# Patient Record
Sex: Male | Born: 1945 | Race: White | Hispanic: No | Marital: Married | Smoking: Former smoker
Health system: Southern US, Community
[De-identification: ages and names within clinical notes are randomized; demographics above are authoritative.]

## PROBLEM LIST (undated history)

## (undated) DIAGNOSIS — G4733 Obstructive sleep apnea (adult) (pediatric): Secondary | ICD-10-CM

## (undated) DIAGNOSIS — I739 Peripheral vascular disease, unspecified: Secondary | ICD-10-CM

## (undated) DIAGNOSIS — F4310 Post-traumatic stress disorder, unspecified: Secondary | ICD-10-CM

## (undated) DIAGNOSIS — E669 Obesity, unspecified: Secondary | ICD-10-CM

## (undated) DIAGNOSIS — E785 Hyperlipidemia, unspecified: Secondary | ICD-10-CM

## (undated) DIAGNOSIS — F32A Depression, unspecified: Secondary | ICD-10-CM

## (undated) DIAGNOSIS — R0602 Shortness of breath: Secondary | ICD-10-CM

## (undated) DIAGNOSIS — I639 Cerebral infarction, unspecified: Secondary | ICD-10-CM

## (undated) DIAGNOSIS — I25810 Atherosclerosis of coronary artery bypass graft(s) without angina pectoris: Secondary | ICD-10-CM

## (undated) DIAGNOSIS — I252 Old myocardial infarction: Secondary | ICD-10-CM

## (undated) DIAGNOSIS — E119 Type 2 diabetes mellitus without complications: Secondary | ICD-10-CM

## (undated) HISTORY — DX: Shortness of breath: R06.02

## (undated) HISTORY — DX: Peripheral vascular disease, unspecified: I73.9

## (undated) HISTORY — DX: Hyperlipidemia, unspecified: E78.5

## (undated) HISTORY — DX: Old myocardial infarction: I25.2

## (undated) HISTORY — DX: Obesity, unspecified: E66.9

## (undated) HISTORY — DX: Obstructive sleep apnea (adult) (pediatric): G47.33

## (undated) HISTORY — DX: Atherosclerosis of coronary artery bypass graft(s) without angina pectoris: I25.810

## (undated) HISTORY — DX: Type 2 diabetes mellitus without complications: E11.9

---

## 1999-10-06 ENCOUNTER — Encounter: Admission: RE | Admit: 1999-10-06 | Discharge: 2000-01-04 | Payer: Self-pay | Admitting: Family Medicine

## 2000-01-08 ENCOUNTER — Emergency Department (HOSPITAL_COMMUNITY): Admission: EM | Admit: 2000-01-08 | Discharge: 2000-01-08 | Payer: Self-pay | Admitting: Emergency Medicine

## 2001-01-07 ENCOUNTER — Encounter: Payer: Self-pay | Admitting: Family Medicine

## 2001-01-07 ENCOUNTER — Ambulatory Visit (HOSPITAL_COMMUNITY): Admission: RE | Admit: 2001-01-07 | Discharge: 2001-01-07 | Payer: Self-pay | Admitting: Family Medicine

## 2001-01-16 ENCOUNTER — Ambulatory Visit (HOSPITAL_COMMUNITY): Admission: RE | Admit: 2001-01-16 | Discharge: 2001-01-16 | Payer: Self-pay | Admitting: Family Medicine

## 2001-01-16 ENCOUNTER — Encounter: Payer: Self-pay | Admitting: Family Medicine

## 2001-10-25 ENCOUNTER — Encounter: Payer: Self-pay | Admitting: Cardiovascular Disease

## 2001-10-25 ENCOUNTER — Ambulatory Visit (HOSPITAL_COMMUNITY): Admission: RE | Admit: 2001-10-25 | Discharge: 2001-10-25 | Payer: Self-pay | Admitting: Cardiovascular Disease

## 2003-12-13 ENCOUNTER — Ambulatory Visit (HOSPITAL_COMMUNITY): Admission: RE | Admit: 2003-12-13 | Discharge: 2003-12-13 | Payer: Self-pay | Admitting: Family Medicine

## 2003-12-22 DIAGNOSIS — I252 Old myocardial infarction: Secondary | ICD-10-CM

## 2003-12-22 HISTORY — DX: Old myocardial infarction: I25.2

## 2004-08-27 ENCOUNTER — Observation Stay (HOSPITAL_COMMUNITY): Admission: EM | Admit: 2004-08-27 | Discharge: 2004-08-28 | Payer: Self-pay | Admitting: Emergency Medicine

## 2004-08-28 ENCOUNTER — Encounter: Payer: Self-pay | Admitting: Neurology

## 2004-08-28 ENCOUNTER — Encounter (INDEPENDENT_AMBULATORY_CARE_PROVIDER_SITE_OTHER): Payer: Self-pay | Admitting: Cardiology

## 2004-09-02 ENCOUNTER — Encounter (HOSPITAL_BASED_OUTPATIENT_CLINIC_OR_DEPARTMENT_OTHER): Admission: RE | Admit: 2004-09-02 | Discharge: 2004-09-16 | Payer: Self-pay | Admitting: Internal Medicine

## 2004-10-09 ENCOUNTER — Inpatient Hospital Stay (HOSPITAL_COMMUNITY): Admission: EM | Admit: 2004-10-09 | Discharge: 2004-10-12 | Payer: Self-pay | Admitting: Emergency Medicine

## 2004-10-10 ENCOUNTER — Encounter: Payer: Self-pay | Admitting: Cardiology

## 2004-11-06 ENCOUNTER — Ambulatory Visit: Payer: Self-pay | Admitting: Cardiology

## 2004-11-10 ENCOUNTER — Encounter (HOSPITAL_COMMUNITY): Admission: RE | Admit: 2004-11-10 | Discharge: 2005-02-08 | Payer: Self-pay | Admitting: Cardiology

## 2005-02-03 ENCOUNTER — Ambulatory Visit: Payer: Self-pay | Admitting: Cardiology

## 2005-02-09 ENCOUNTER — Encounter (HOSPITAL_COMMUNITY): Admission: RE | Admit: 2005-02-09 | Discharge: 2005-05-10 | Payer: Self-pay | Admitting: Cardiology

## 2005-02-18 ENCOUNTER — Ambulatory Visit: Payer: Self-pay | Admitting: Pulmonary Disease

## 2005-03-27 ENCOUNTER — Encounter: Payer: Self-pay | Admitting: Pulmonary Disease

## 2005-03-27 ENCOUNTER — Ambulatory Visit (HOSPITAL_BASED_OUTPATIENT_CLINIC_OR_DEPARTMENT_OTHER): Admission: RE | Admit: 2005-03-27 | Discharge: 2005-03-27 | Payer: Self-pay | Admitting: Pulmonary Disease

## 2005-04-17 ENCOUNTER — Ambulatory Visit: Payer: Self-pay | Admitting: Pulmonary Disease

## 2005-05-04 ENCOUNTER — Ambulatory Visit: Payer: Self-pay | Admitting: Pulmonary Disease

## 2005-09-30 ENCOUNTER — Ambulatory Visit: Payer: Self-pay | Admitting: Cardiology

## 2005-11-16 ENCOUNTER — Ambulatory Visit: Payer: Self-pay | Admitting: Cardiology

## 2007-07-14 ENCOUNTER — Ambulatory Visit: Payer: Self-pay | Admitting: Pulmonary Disease

## 2007-08-17 ENCOUNTER — Ambulatory Visit: Payer: Self-pay | Admitting: Cardiology

## 2007-08-25 ENCOUNTER — Ambulatory Visit: Payer: Self-pay

## 2007-09-08 ENCOUNTER — Ambulatory Visit: Payer: Self-pay | Admitting: Cardiology

## 2007-09-08 LAB — CONVERTED CEMR LAB
BUN: 18 mg/dL (ref 6–23)
CO2: 31 meq/L (ref 19–32)
Calcium: 10.1 mg/dL (ref 8.4–10.5)
Chloride: 103 meq/L (ref 96–112)
Creatinine, Ser: 1.2 mg/dL (ref 0.4–1.5)
GFR calc Af Amer: 79 mL/min
GFR calc non Af Amer: 66 mL/min
Glucose, Bld: 100 mg/dL — ABNORMAL HIGH (ref 70–99)
Potassium: 4.6 meq/L (ref 3.5–5.1)
Sodium: 141 meq/L (ref 135–145)

## 2007-09-14 ENCOUNTER — Ambulatory Visit: Payer: Self-pay | Admitting: Cardiology

## 2007-09-14 LAB — CONVERTED CEMR LAB
BUN: 19 mg/dL (ref 6–23)
Basophils Absolute: 0 10*3/uL (ref 0.0–0.1)
Basophils Relative: 0.5 % (ref 0.0–1.0)
CO2: 27 meq/L (ref 19–32)
Calcium: 9.5 mg/dL (ref 8.4–10.5)
Chloride: 110 meq/L (ref 96–112)
Creatinine, Ser: 1.2 mg/dL (ref 0.4–1.5)
Eosinophils Absolute: 0.1 10*3/uL (ref 0.0–0.6)
Eosinophils Relative: 1.6 % (ref 0.0–5.0)
GFR calc Af Amer: 79 mL/min
GFR calc non Af Amer: 66 mL/min
Glucose, Bld: 79 mg/dL (ref 70–99)
HCT: 42.8 % (ref 39.0–52.0)
Hemoglobin: 14.5 g/dL (ref 13.0–17.0)
INR: 0.8 (ref 0.8–1.0)
Lymphocytes Relative: 20.6 % (ref 12.0–46.0)
MCHC: 33.9 g/dL (ref 30.0–36.0)
MCV: 91.4 fL (ref 78.0–100.0)
Monocytes Absolute: 0.7 10*3/uL (ref 0.2–0.7)
Monocytes Relative: 8.5 % (ref 3.0–11.0)
Neutro Abs: 5.6 10*3/uL (ref 1.4–7.7)
Neutrophils Relative %: 68.8 % (ref 43.0–77.0)
Platelets: 188 10*3/uL (ref 150–400)
Potassium: 6.8 meq/L (ref 3.5–5.1)
Prothrombin Time: 10.9 s (ref 10.9–13.3)
RBC: 4.68 M/uL (ref 4.22–5.81)
RDW: 13.6 % (ref 11.5–14.6)
Sodium: 142 meq/L (ref 135–145)
WBC: 8 10*3/uL (ref 4.5–10.5)
aPTT: 26.4 s (ref 21.7–29.8)

## 2007-09-15 ENCOUNTER — Inpatient Hospital Stay (HOSPITAL_COMMUNITY): Admission: EM | Admit: 2007-09-15 | Discharge: 2007-09-18 | Payer: Self-pay | Admitting: Emergency Medicine

## 2007-09-15 ENCOUNTER — Ambulatory Visit: Payer: Self-pay | Admitting: Internal Medicine

## 2007-09-16 ENCOUNTER — Encounter: Payer: Self-pay | Admitting: Thoracic Surgery (Cardiothoracic Vascular Surgery)

## 2007-09-16 ENCOUNTER — Ambulatory Visit: Payer: Self-pay | Admitting: Thoracic Surgery (Cardiothoracic Vascular Surgery)

## 2007-09-21 ENCOUNTER — Ambulatory Visit (HOSPITAL_COMMUNITY)
Admission: RE | Admit: 2007-09-21 | Discharge: 2007-09-21 | Payer: Self-pay | Admitting: Thoracic Surgery (Cardiothoracic Vascular Surgery)

## 2007-09-21 DIAGNOSIS — I639 Cerebral infarction, unspecified: Secondary | ICD-10-CM

## 2007-09-21 HISTORY — DX: Cerebral infarction, unspecified: I63.9

## 2007-09-22 ENCOUNTER — Inpatient Hospital Stay (HOSPITAL_COMMUNITY)
Admission: RE | Admit: 2007-09-22 | Discharge: 2007-09-27 | Payer: Self-pay | Admitting: Thoracic Surgery (Cardiothoracic Vascular Surgery)

## 2007-10-07 ENCOUNTER — Ambulatory Visit: Payer: Self-pay | Admitting: Thoracic Surgery (Cardiothoracic Vascular Surgery)

## 2007-10-17 ENCOUNTER — Ambulatory Visit: Payer: Self-pay | Admitting: Cardiology

## 2007-10-24 ENCOUNTER — Ambulatory Visit: Payer: Self-pay | Admitting: Thoracic Surgery (Cardiothoracic Vascular Surgery)

## 2007-10-24 ENCOUNTER — Encounter
Admission: RE | Admit: 2007-10-24 | Discharge: 2007-10-24 | Payer: Self-pay | Admitting: Thoracic Surgery (Cardiothoracic Vascular Surgery)

## 2007-11-23 ENCOUNTER — Ambulatory Visit: Payer: Self-pay | Admitting: Cardiology

## 2008-02-21 ENCOUNTER — Ambulatory Visit: Payer: Self-pay | Admitting: Cardiology

## 2008-05-25 ENCOUNTER — Ambulatory Visit: Payer: Self-pay | Admitting: Cardiology

## 2008-05-25 LAB — CONVERTED CEMR LAB
ALT: 41 units/L (ref 0–53)
AST: 45 units/L — ABNORMAL HIGH (ref 0–37)
Albumin: 3.9 g/dL (ref 3.5–5.2)
Alkaline Phosphatase: 51 units/L (ref 39–117)
BUN: 10 mg/dL (ref 6–23)
Bilirubin, Direct: 0.1 mg/dL (ref 0.0–0.3)
CO2: 28 meq/L (ref 19–32)
Calcium: 9.5 mg/dL (ref 8.4–10.5)
Chloride: 103 meq/L (ref 96–112)
Cholesterol: 131 mg/dL (ref 0–200)
Creatinine, Ser: 1.1 mg/dL (ref 0.4–1.5)
GFR calc Af Amer: 88 mL/min
GFR calc non Af Amer: 73 mL/min
Glucose, Bld: 101 mg/dL — ABNORMAL HIGH (ref 70–99)
HDL: 34.6 mg/dL — ABNORMAL LOW (ref >39.0)
LDL Cholesterol: 60 mg/dL (ref 0–99)
Potassium: 4.4 meq/L (ref 3.5–5.1)
Sodium: 141 meq/L (ref 135–145)
Total Bilirubin: 0.7 mg/dL (ref 0.3–1.2)
Total CHOL/HDL Ratio: 3.8
Total Protein: 7 g/dL (ref 6.0–8.3)
Triglycerides: 184 mg/dL — ABNORMAL HIGH (ref 0–149)
VLDL: 37 mg/dL (ref 0–40)

## 2008-07-23 ENCOUNTER — Ambulatory Visit: Payer: Self-pay | Admitting: Cardiology

## 2008-08-06 ENCOUNTER — Encounter: Payer: Self-pay | Admitting: Pulmonary Disease

## 2008-08-06 ENCOUNTER — Ambulatory Visit: Payer: Self-pay | Admitting: Cardiology

## 2008-08-06 DIAGNOSIS — E119 Type 2 diabetes mellitus without complications: Secondary | ICD-10-CM | POA: Insufficient documentation

## 2008-08-06 DIAGNOSIS — E785 Hyperlipidemia, unspecified: Secondary | ICD-10-CM | POA: Insufficient documentation

## 2008-08-06 DIAGNOSIS — E1151 Type 2 diabetes mellitus with diabetic peripheral angiopathy without gangrene: Secondary | ICD-10-CM | POA: Insufficient documentation

## 2008-08-06 DIAGNOSIS — E669 Obesity, unspecified: Secondary | ICD-10-CM | POA: Insufficient documentation

## 2008-08-06 DIAGNOSIS — G4733 Obstructive sleep apnea (adult) (pediatric): Secondary | ICD-10-CM | POA: Insufficient documentation

## 2008-08-06 LAB — CONVERTED CEMR LAB
BUN: 14 mg/dL (ref 6–23)
CO2: 32 meq/L (ref 19–32)
Calcium: 10 mg/dL (ref 8.4–10.5)
Chloride: 106 meq/L (ref 96–112)
Creatinine, Ser: 1.2 mg/dL (ref 0.4–1.5)
GFR calc Af Amer: 79 mL/min
GFR calc non Af Amer: 66 mL/min
Glucose, Bld: 155 mg/dL — ABNORMAL HIGH (ref 70–99)
Potassium: 5.3 meq/L — ABNORMAL HIGH (ref 3.5–5.1)
Sodium: 142 meq/L (ref 135–145)

## 2008-08-30 ENCOUNTER — Ambulatory Visit: Payer: Self-pay | Admitting: Cardiology

## 2008-08-30 LAB — CONVERTED CEMR LAB
BUN: 11 mg/dL (ref 6–23)
CO2: 28 meq/L (ref 19–32)
Calcium: 9.7 mg/dL (ref 8.4–10.5)
Chloride: 101 meq/L (ref 96–112)
Creatinine, Ser: 1.1 mg/dL (ref 0.4–1.5)
GFR calc Af Amer: 88 mL/min
GFR calc non Af Amer: 72 mL/min
Glucose, Bld: 202 mg/dL — ABNORMAL HIGH (ref 70–99)
Potassium: 4.9 meq/L (ref 3.5–5.1)
Sodium: 136 meq/L (ref 135–145)

## 2008-11-23 ENCOUNTER — Encounter: Payer: Self-pay | Admitting: Pulmonary Disease

## 2008-12-07 IMAGING — CR DG CHEST 1V PORT
1 series · 1 of 1 positions shown · non-contrast
Comparison: 09/16/07.

CLINICAL DATA: CAD, CABG.
PORTABLE CHEST - 1 VIEW ? 0088 HOURS:

[view not recorded]
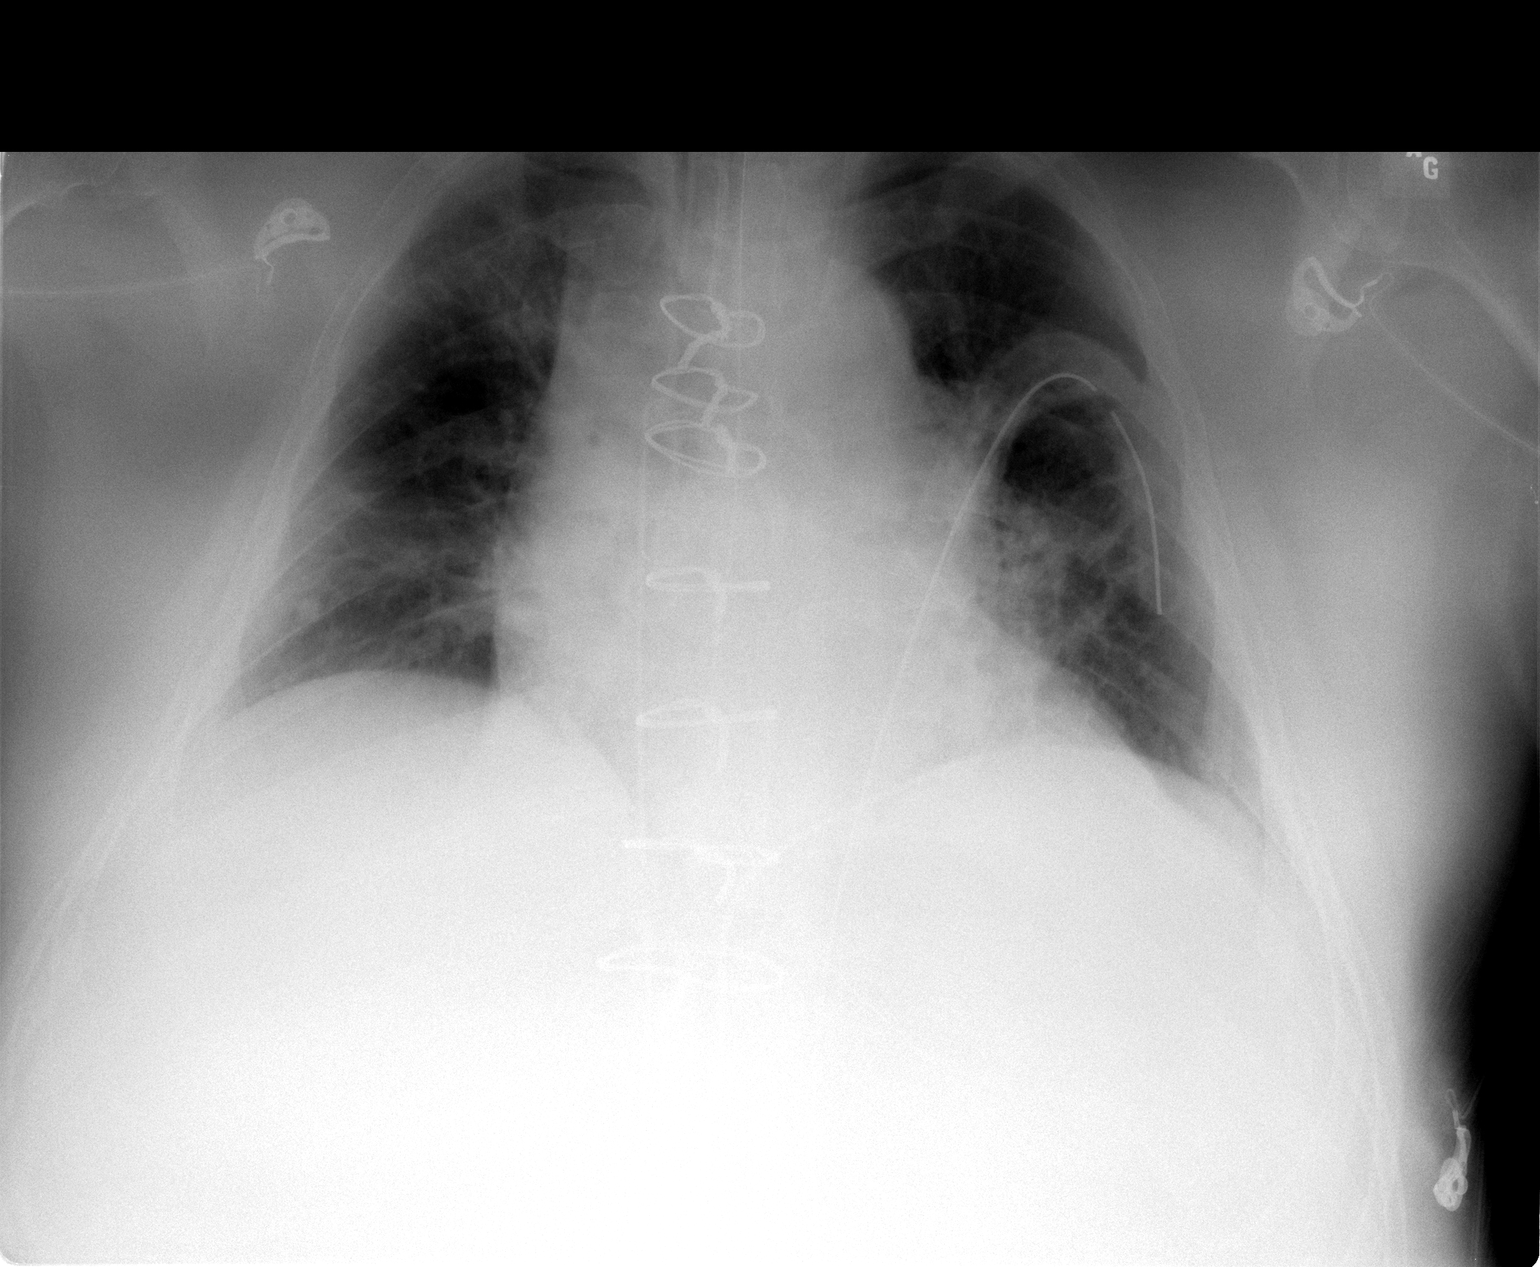

[1 of 1 positions shown; findings below may reference images not displayed]

FINDINGS: Status post CABG.  Multiple sternal wire sutures.  Mediastinal and left pleural chest tubes in situ.  Mild atelectatic changes in the lower lung zones.  No airspace opacities.  No pneumothorax.  Satisfactory ET tube position.  NG tube is noted traversing the esophagus and proximal stomach.  Cardiomediastinal silhouette appears unremarkable in caliber post CABG.
IMPRESSION: Atelectatic changes at the bases and minimally in the right upper lobe.  No pneumothorax.

## 2008-12-08 IMAGING — CR DG CHEST 1V PORT
1 series · 1 of 1 positions shown · non-contrast
Comparison: Yesterday.

CLINICAL DATA: Status post CABG.

PORTABLE CHEST - 1 VIEW

[view not recorded]
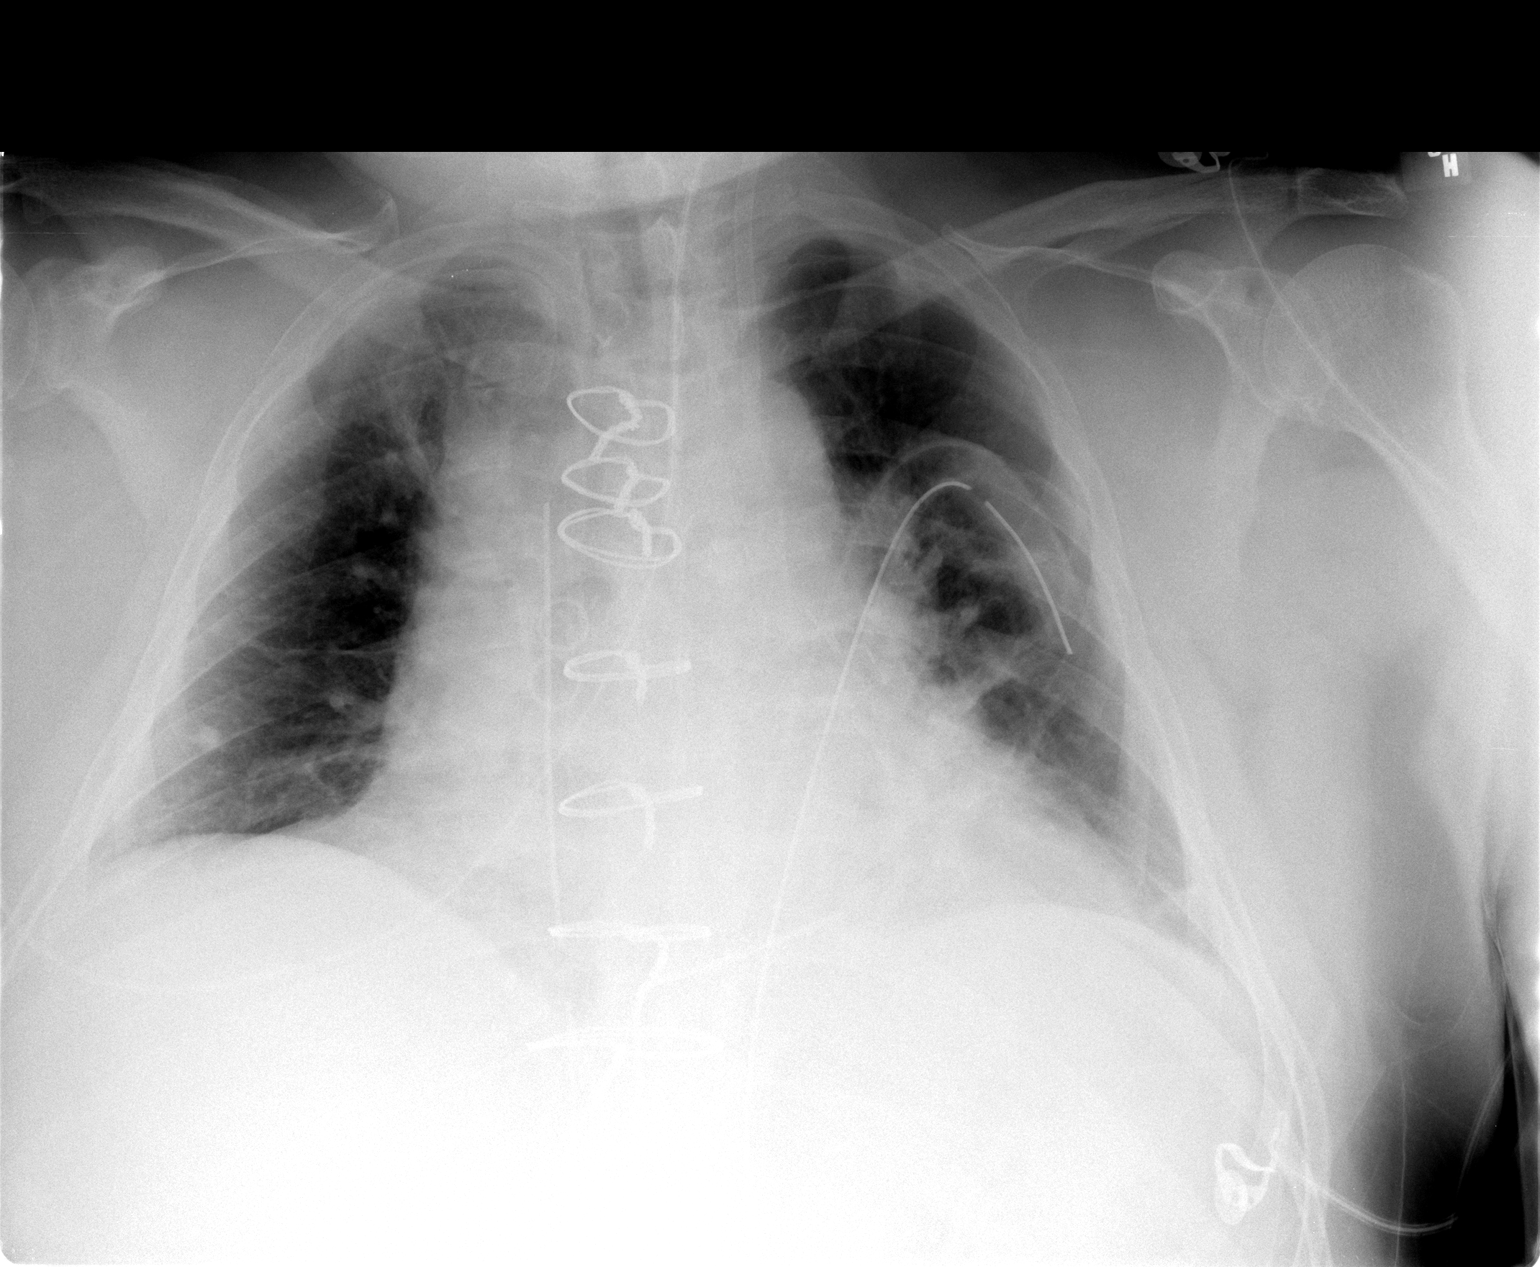

[1 of 1 positions shown; findings below may reference images not displayed]

FINDINGS: Stable post CABG changes with mediastinal and left chest tubes. No
pneumothorax seen. Stable right lung calcified granulomata and enlargement of
the cardiac silhouette. Mildly improved left lung airspace opacity and
significantly improved right basilar opacity.  

IMPRESSION

1. Improving bilateral atelectasis.
2. Stable cardiomegaly.

## 2009-12-10 ENCOUNTER — Ambulatory Visit: Payer: Self-pay | Admitting: Cardiology

## 2009-12-10 DIAGNOSIS — I25810 Atherosclerosis of coronary artery bypass graft(s) without angina pectoris: Secondary | ICD-10-CM | POA: Insufficient documentation

## 2009-12-10 DIAGNOSIS — R0602 Shortness of breath: Secondary | ICD-10-CM | POA: Insufficient documentation

## 2009-12-10 DIAGNOSIS — I252 Old myocardial infarction: Secondary | ICD-10-CM | POA: Insufficient documentation

## 2009-12-23 ENCOUNTER — Telehealth (INDEPENDENT_AMBULATORY_CARE_PROVIDER_SITE_OTHER): Payer: Self-pay

## 2009-12-24 ENCOUNTER — Telehealth (INDEPENDENT_AMBULATORY_CARE_PROVIDER_SITE_OTHER): Payer: Self-pay

## 2010-01-04 ENCOUNTER — Emergency Department (HOSPITAL_COMMUNITY): Admission: EM | Admit: 2010-01-04 | Discharge: 2010-01-04 | Payer: Self-pay | Admitting: Emergency Medicine

## 2011-01-20 NOTE — Assessment & Plan Note (Signed)
Summary: ROV  Medications Added METOPROLOL TARTRATE 50 MG  TABS (METOPROLOL TARTRATE) 1 two times a day [facility] CINNAMON 500 MG CAPS (CINNAMON) 2 TABS EVERY DAY      Allergies Added: ! LISINOPRIL  Visit Type:  rov  CC:  chest pain a few months back..some sob at times...numbness in hands at times but denies any edema.  Current Medications (verified): 1)  Glipizide 10 Mg  Tabs (Glipizide) .... Take 2 Tabs By Mouth Two Times A Day 2)  Metformin Hcl 850 Mg  Tabs (Metformin Hcl) .... Take 1 Tablet By Mouth Three Times A Day 3)  Crestor 20 Mg  Tabs (Rosuvastatin Calcium) .... Take 1 Tablet By Mouth Once A Day 4)  Bayer Aspirin 325 Mg  Tabs (Aspirin) .... Take 1 Tablet By Mouth Once A Day 5)  Fish Oil 1000 Mg  Caps (Omega-3 Fatty Acids) .... Take 1 Tablet By Mouth Three Times A Day 6)  Metoprolol Tartrate 50 Mg  Tabs (Metoprolol Tartrate) .Marland Kitchen.. 1 Two Times A Day 7)  Furosemide 20 Mg  Tabs (Furosemide) .... Take 1 Tablet By Mouth Once A Day 8)  Plavix 75 Mg  Tabs (Clopidogrel Bisulfate) .... Take 1 Tablet By Mouth Once A Day 9)  Nitroglycerin 0.4 Mg  Subl (Nitroglycerin) .... Use As Directed As Needed 10)  Cvs Cinnamon 500 Mg Caps (Cinnamon) .... 2 Tabs Every Day  Allergies (verified): 1)  ! 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 Take 1 tablet by mouth once a day    Metoprolol Tartrate 50 Mg Tabs (Metoprolol tartrate) .Marland Kitchen... 1 two times a day    Furosemide 20 Mg Tabs (Furosemide) .Marland Kitchen... Take 1 tablet by mouth once a day I am concerned about his dyspnea on exertion and known coronary disease and diabetes. This could be an ischemic equivalent. This is how we initially used diagnosed three-vessel disease leading to subsequent bypass surgery. We will schedule an objective assessment with a Myoview. Patient is in agreement. Orders: Nuclear Stress Test (Nuc Stress Test)  Orders: Nuclear Stress Test (Nuc Stress Test)  Problem # 2:  CAD, AUTOLOGOUS BYPASS GRAFT (ICD-414.02)  His updated medication list for this problem includes:    Bayer Aspirin 325 Mg Tabs (Aspirin) .Marland Kitchen... Take 1 tablet by mouth once a day    Metoprolol Tartrate 50 Mg Tabs (Metoprolol tartrate) .Marland Kitchen... 1 two times a day    Plavix 75 Mg Tabs (Clopidogrel bisulfate) .Marland Kitchen... Take 1 tablet by mouth once a day    Nitroglycerin 0.4 Mg Subl (Nitroglycerin) ..... Use as directed as needed  His updated medication list for this problem includes:    Bayer Aspirin 325 Mg Tabs (Aspirin) .Marland Kitchen... Take 1 tablet by mouth once a day    Metoprolol Tartrate 50 Mg Tabs (Metoprolol tartrate) .Marland Kitchen... 1 two times a day    Plavix 75 Mg Tabs (Clopidogrel bisulfate) .Marland Kitchen... Take 1 tablet by mouth once a day    Nitroglycerin 0.4 Mg Subl (Nitroglycerin) ..... Use as directed as needed  Problem # 3:  OBESITY (ICD-278.00) Assessment: Unchanged  Problem # 4:  HYPERLIPIDEMIA (ICD-272.4)  His updated medication list for this problem includes:    Crestor 20 Mg Tabs (Rosuvastatin calcium) .Marland Kitchen... Take 1 tablet by mouth once a day  Problem # 5:  DIABETES, TYPE 2 (ICD-250.00)  His updated medication list for this problem includes:    Glipizide 10 Mg Tabs (Glipizide) .Marland Kitchen... Take 2 tabs by mouth two times a  day    Metformin Hcl 850 Mg Tabs (Metformin hcl) .Marland Kitchen... Take 1 tablet by mouth three times a day    Bayer Aspirin 325 Mg Tabs (Aspirin) .Marland Kitchen... Take 1 tablet by mouth once a day  Problem # 6:  OLD MYOCARDIAL INFARCTION (ICD-412) Assessment: Unchanged  His updated medication list for this problem includes:    Bayer Aspirin 325 Mg Tabs (Aspirin) .Marland Kitchen... Take 1 tablet by mouth once a day    Metoprolol Tartrate 50 Mg Tabs (Metoprolol tartrate) .Marland Kitchen... 1 two times a day    Plavix 75 Mg Tabs (Clopidogrel bisulfate) .Marland Kitchen... Take 1 tablet by mouth once a day    Nitroglycerin 0.4 Mg Subl (Nitroglycerin) ..... Use as directed as needed  Other Orders: EKG w/ Interpretation (93000)  Patient Instructions: 1)  Your physician recommends that you schedule a follow-up appointment in: 12 MONTHS WITH DR Sherica Paternostro DUE 11/2010 2)  Your physician recommends that you continue on your current medications as directed. Please refer to the Current Medication list given to you today. 3)  Your physician has requested that you have an adenosine myoview.  For further information please visit [email or web address]  Please follow instruction sheet, as given.  Appended Document: [facility]      [facility] Provider[facility]   History of Present Illness: Mr. Weidler comes in today because of concerns about a slow heart rate. He checked his pulse other day he thought it was in the 30s. He denies any dizziness or syncope. He's had no chest pain or dyspnea on exertion or exercise intolerance.  He is due objective assessment of his coronary disease.  Allergies: 1)  ! Lisinopril  Appended Document: [facility]      Primary Provider:  [facility]   History of Present Illness: Previous append was incorrect for the patient.  Patient comes in today for followup of his coronary artery disease. He did have a significant dyspnea on exertion. This was his initial anginal equivalent.  He  denies any palpitations, presyncope, syncope, orthopnea, or PND. He is due objective assessment of his coronary disease.  Allergies: 1)  ! Lisinopril

## 2011-01-20 NOTE — Assessment & Plan Note (Signed)
Summary: rov for osa   Chief Complaint:  Pt is here for a follow up.  Pt states he has been wearing his cpap approx 75 % of the time.  Pt denied any new complaints.  .  History of Present Illness: the patient comes in today for followup of his obstructive sleep apnea. He has been wearing the CPAP fairly compliantly, and feels that he sleeps well with adequate daytime alertness. He is asking about a new CPAP machine, since his is aged. I have told him that we will check with insurance on this to see if he is eligible. He denies any issues with his mask fit, and is having no difficulty with pressure.     Current Allergies: No known allergies   Past Medical History:    Current Problems:     OBSTRUCTIVE SLEEP APNEA (ICD-327.23)    HYPERLIPIDEMIA (ICD-272.4)    DIABETES, TYPE 2 (ICD-250.00)    OBESITY (ICD-278.00)    CORONARY ARTERY DISEASE (ICD-414.00)         Risk Factors:  Tobacco use:  never    Year started:  since age 5     Vital Signs:  Patient Profile:   65 Years Old Male Weight:      253.50 pounds O2 Sat:      96 % O2 treatment:    Room Air Temp:     97.9 degrees F oral Pulse rate:   90 / minute BP sitting:   118 / 70  (right arm) Cuff size:   regular  Vitals Entered By: Cyndia Diver LPN (August 06, 2008 3:15 PM)             Is Patient Diabetic? Yes  Comments Medications reviewed with patient Cyndia Diver LPN  August 06, 2008 3:15 PM      Physical Exam  General:     overweight male in no acute distress Nose:     no skin breakdown or pressure necrosis from the CPAP mask      Impression & Recommendations:  Problem # 1:  OBSTRUCTIVE SLEEP APNEA (ICD-327.23) the patient is doing well from a sleep apnea standpoint. He is having no difficulties with tolerance, and is satisfied with his daytime alertness. I have encouraged him to work aggressively on weight loss however. I will also check with insurance to see if he qualifies for a new CPAP machine at  this point.  Medications Added to Medication List This Visit: 1)  Glipizide 10 Mg Tabs (Glipizide) .... Take 2 tabs by mouth two times a day 2)  Metformin Hcl 850 Mg Tabs (Metformin hcl) .... Take 1 tablet by mouth three times a day 3)  Crestor 20 Mg Tabs (Rosuvastatin calcium) .... Take 1 tablet by mouth once a day 4)  Bayer Aspirin 325 Mg Tabs (Aspirin) .... Take 1 tablet by mouth once a day 5)  Fish Oil 1000 Mg Caps (Omega-3 fatty acids) .... Take 1 tablet by mouth three times a day 6)  Metoprolol Tartrate 50 Mg Tabs (Metoprolol tartrate) .... Take 1 tablet by mouth once a day 7)  Furosemide 20 Mg Tabs (Furosemide) .... Take 1 tablet by mouth once a day 8)  Plavix 75 Mg Tabs (Clopidogrel bisulfate) .... Take 1 tablet by mouth once a day 9)  Nitroglycerin 0.4 Mg Subl (Nitroglycerin) .... Use as directed as needed   Patient Instructions: 1)  will get new supplies and mask for you 2)  will get new cpap if insurance allows 3)  work on weight loss 4)  f/u with me in one year, or sooner if problems   ]

## 2011-01-20 NOTE — Progress Notes (Signed)
Summary: sleep apnea/[facility]  sleep apnea/[facility]   Imported By: Lester [place] 08/07/2008 07:49:01  _____________________________________________________________________  External Attachment:    Type:   Image     Comment:   External Document

## 2011-01-20 NOTE — Progress Notes (Signed)
Summary: Cancellation of myoview  Phone Note Outgoing Call Call back at [facility] Phone [phone]   Call placed by: Irean Hong, RN,  December 24, 2009 10:11 AM Summary of Call: Myoview cancelled yesterday due to car trouble, left message on the patient's recorder to call us back to reschedule; notified Dr. Candie Mile nurse of no show. P. Charm Stenner,RN.

## 2011-01-20 NOTE — Progress Notes (Signed)
Summary: Nuc. Pre-Procedure  Phone Note Outgoing Call Call back at [facility] Phone [phone]   Call placed by: Irean Hong, RN,  December 23, 2009 11:12 AM Summary of Call: Left message with information on Myoview Information Sheet (see scanned document for details). Spoke with family member, and the patient is in [place] with car trouble. The patient wants to cancel the test, and will call us back to reschedule.P. Mizani Dilday,RN.     Nuclear Med Background Indications for Stress Test: Evaluation for Ischemia, Graft Patency, Stent Patency   History: Angioplasty, CABG, Echo, Heart Catheterization, Myocardial Infarction, Myocardial Perfusion Study, Stents  History Comments: '05 AWMI>PTCA/Stent LAD,DX. 9/08 MPS: Anteroseptal scar with mild peri-infarct ischemia> Cath: 2 vessel disease, patent LAD stent, EF=60%>CABG (x3). 10/08 Echo: EF=50%.  Symptoms: Chest Pain, DOE, SOB  Symptoms Comments: Occ. numbness of hands.   Nuclear Pre-Procedure Cardiac Risk Factors: Claudication, Family History - CAD, Lipids, NIDDM, Obesity, TIA Height (in): 68

## 2011-05-05 NOTE — Assessment & Plan Note (Signed)
OFFICE VISIT   [name], [name]  DOB:  01/17/1946                                        October 24, 2007  CHART #:  [id number]   SUBJECTIVE:  The patient is here for followup today.  He is status post  coronary artery bypass grafting on 09/22/2007.  He has done well  following his discharge home.  He has recently been seen by Dr. Daleen Squibb and  was taken off his Lisinopril and Plavix.  His blood sugars have been  well controlled and they have decreased his dose of Glipizide.  They did  increase his beta blocker dose and he has been tolerating this well.  His only complaint today is some edema in his hands.  He reports that  particularly on the right he is having problems with swelling in his  hand and difficulty making a fist.  He denies any loss of sensation or  numbness in the area.  He noted that this began shortly after his Lasix  was discontinued.  He is otherwise doing well.  He is riding a  stationary bike and walking daily.  His pain is well controlled.  His  appetite and his stamina are improving and he is otherwise doing well.   PHYSICAL EXAMINATION:  Blood pressure 113/72, pulse 88, respirations 18,  and O2 saturation 95% on room air.  His sternal incision is healing  well.  He does have an area on his left chest tube site, which has some  superficial skin edge necrosis, but there is no evidence of erythema or  drainage.  His leg incision also is healing well, but there is some  superficial separation with mild skin edge necrosis without erythema or  drainage.  Heart regular rate and rhythm without murmurs, rubs or  gallops.  Lungs are clear to auscultation.  Extremities show mild lower  extremity edema.  His chest x-ray shows no significant change.   ASSESSMENT AND PLAN:  The patient is doing well status post coronary  artery bypass graft on 09/22/2007.  He was seen by myself and Dr. Cornelius Moras  today.  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  Cornelius Moras has recommended conservative followup.  He was asked to followup on  an as needed basis and call if there are nay problems.   Salvatore Decent. Cornelius Moras, M.D.  Electronically Signed   GC/MEDQ  D:  10/24/2007  T:  10/25/2007  Job:  [id number]   cc:   Thomas C. Wall, MD, [facility]

## 2011-05-05 NOTE — Op Note (Signed)
NAME:  [name], [name] NO.:  [id number]   MEDICAL RECORD NO.:  [id number]          PATIENT TYPE:  INP   LOCATION:  2302                         FACILITY:  [facility]   PHYSICIAN:  Salvatore Decent. Cornelius Moras, M.D. DATE OF BIRTH:  10-21-1946   DATE OF PROCEDURE:  09/22/2007  DATE OF DISCHARGE:                               OPERATIVE REPORT   PREOPERATIVE DIAGNOSIS:  Severe two-vessel coronary artery disease.   POSTOPERATIVE DIAGNOSIS:  Severe two-vessel coronary artery disease.   PROCEDURE:  Median sternotomy for coronary artery bypass grafting x3  (left internal mammary artery to distal left anterior descending  coronary artery, saphenous vein graft to first diagonal branch,  saphenous vein graft to circumflex marginal branch,, endoscopic  saphenous vein harvest from right thigh).   SURGEON:  Dr. Purcell Nails.   ASSISTANT:  Dr. Charlett Lango.   SECOND ASSISTANT:  Mr. Lenise Herald.   ANESTHESIA:  General.   BRIEF CLINICAL NOTE:  The patient is a 65 year old obese white male with  known history of coronary artery disease, type 2 diabetes mellitus,  obesity, hyperlipidemia, and chronic obstructive sleep apnea.  The  patient's cardiac history dates back to October 2005 at which time he  suffered an acute anterior wall myocardial infarction.  He was treated  with cutting balloon angioplasty and stent placement of the left  anterior descending coronary artery and first diagonal branch.  Initially did he did well.  Recently he presents with symptoms of severe  exertional shortness of breath.  He underwent a stress Myoview exam that  was abnormal prompting cardiac catheterization that was performed  September 26.  This demonstrates mild to moderate left ventricular  dysfunction with severe two-vessel coronary artery disease including  restenosis of the left anterior descending coronary artery.  There is  coronary anatomy very unfavorable for repeat percutaneous  coronary  intervention.  Cardiac surgical consultation was requested.  The patient  and his wife have been counseled at length regarding the indications,  risks, and potential benefits of surgery.  Alternative treatment  strategies have been discussed.  They understand and accept all  associated risks of surgery and desire to proceed as described.   OPERATIVE FINDINGS:  1. Mild left ventricular dysfunction with ejection fraction estimated      50-55%.  2. Good-quality left internal mammary artery and saphenous vein      conduit for grafting.  3. Good-quality target vessels for grafting.   OPERATIVE NOTE IN DETAIL:  The patient is brought to the operating room  on the above-mentioned date and central monitoring was established by  the anesthesia service under the care and direction of Dr. Sharee Holster.  Specifically, attempts were made to place a Swan-Ganz  catheter.  These were completely unsuccessful and ultimately Swan-Ganz  catheter placement is aborted.  A radial arterial line is placed.  Intravenous antibiotics were administered.  Following induction with  general endotracheal anesthesia, a Foley catheter is placed.  The  patient's chest, abdomen, both groins, and both lower extremities are  prepared, draped in sterile manner.  Baseline transesophageal  echocardiogram was performed by Dr.  Massagee.  This demonstrates  essentially normal left ventricular function with mild anterior wall  hypokinesis.  No other abnormalities that are identified.   A median sternotomy incision is performed and left internal mammary  artery is dissected from the chest wall and prepared for bypass  grafting.  The left internal mammary artery is good-quality conduit.  Simultaneously saphenous vein was obtained the patient's right thigh  using endoscopic vein harvest technique.  The saphenous vein is good-  quality conduit.  Following removal of the saphenous vein, the small  incision in the right  thigh is closed in multiple layers with running  absorbable suture.  The patient is heparinized systemically.  The left  internal mammary artery is transected distally and noted to have  excellent flow.   The pericardium was opened.  The ascending aorta is normal in  appearance.  The ascending aorta and right atrium were cannulated for  cardiopulmonary bypass.  Adequate heparinization is verified.  Cardiopulmonary bypass is begun and the surface of heart was inspected.  Distal sites are selected for coronary bypass grafting.  A temperature  probe is placed in left ventricular septum.  A cardioplegia catheter is  placed in the ascending aorta.   The patient is allowed to cool passively to 32 degrees systemic  temperature.  The aortic crossclamp was applied and cold blood  cardioplegia is administered in an antegrade fashion through the aortic  root.  Iced saline slush is applied for topical hypothermia.  The  initial cardioplegic arrest and myocardial cooling is felt to be  excellent.  Repeat doses of cardioplegia are administered intermittently  throughout the crossclamp portion of the operation through the aortic  root and down the subsequently placed vein grafts to maintain left  ventricular septal temperature below 15 degrees centigrade.   The following distal coronary anastomoses were performed:  1. The first circumflex marginal branch is grafted with a saphenous      vein graft in end-to-side fashion.  This vessel was      intramyocardial.  It measures 1.8 mm in diameter and is a fair to      good quality target vessel for grafting.  2. The first diagonal branch of the left anterior descending coronary      artery is grafted with a saphenous vein graft in end-to-side      fashion.  This vessel measures 1.5 mm in diameter and is a fair to      good quality target vessel for grafting.  3. The distal left anterior descending coronary artery is grafted with      left internal  mammary artery in end-to-side fashion.  This vessel      measured 1.8 mm in diameter and is a good-quality target vessel for      grafting.   Both proximal saphenous vein anastomoses were performed directly to the  ascending aorta prior to removal of the aortic crossclamp.  Left  ventricular septal temperature rises rapidly with reperfusion of the  left internal mammary artery.  The aortic crossclamp was removed after a  total crossclamp time of 61 minutes.  The heart began to beat  spontaneously without need for cardioversion.  All proximal and distal  coronary anastomoses are inspected for hemostasis and appropriate graft  orientation.  Epicardial pacing wires were fixed to left ventricular  outflow tract and to the right atrial appendage.  The patient is  rewarmed to 37 degrees centigrade temperature.  The patient is weaned  from cardiopulmonary  bypass without difficulty.  The patient's rhythm at  separation from bypass is normal sinus rhythm.  No inotropic support is  required.  Total cardiopulmonary bypass time the operation is 74  minutes.   The venous and arterial cannulae are removed uneventfully.  Protamine is  administered to reverse anticoagulation.  The mediastinum and the left  chest are irrigated with saline solution containing vancomycin.  Meticulous surgical hemostasis ascertained.  The mediastinum and left  chest are drained with three chest tubes exited through separate stab  incisions inferiorly.  The soft tissues and pericardium anterior to the  aorta are reapproximated loosely.  The sternum is closed with double-  strength sternal wire.  The soft tissues anterior to the sternum were  closed in multiple layers and the skin is closed with a running  subcuticular skin closure.   The patient tolerated the procedure well and was transported the  surgical intensive care unit in stable condition.  There are no  intraoperative complications.  All sponge, instrument and  needle counts  were verified correct at completion of the operation.  No blood products  were administered.      Salvatore Decent. Cornelius Moras, M.D.  Electronically Signed     CHO/MEDQ  D:  09/22/2007  T:  09/23/2007  Job:  [id number]   cc:   Thomas C. Daleen Squibb, MD, [facility]  Bevelyn Buckles. Bensimhon, MD  Holley Bouche, M.D.

## 2011-05-05 NOTE — Assessment & Plan Note (Signed)
[facility]                            CARDIOLOGY OFFICE NOTE   NAME:[name], [name]                    MRN:          [id number]  DATE:09/08/2007                            DOB:          12/03/1946    OUTPATIENT CARDIAC CATHETERIZATION HISTORY AND PHYSICAL   CHIEF COMPLAINT:  Shortness of breath.   HISTORY OF PRESENT ILLNESS:  [name] is a 65 year old gentleman who  has known coronary artery disease.  He saw me on August 17, 2007, and  complained of profound dyspnea on exertion while climbing [place].   1. He had a previous anterior wall infarct in the past and has had a      cutting balloon and PTCA of the first diagonal and then a stent in      the first diagonal.  He also had a Cypher stent in the proximal      LAD.  His EF improved from 35% to 50% on followup.  This was in      October 2005.  2. Type 2 diabetes.  This has been under poor control.  He is followed      at the [place].  His last hemoglobin A1c was 7.5%.  3. Morbid obesity.  4. Hyperlipidemia, followed at the [place].  5. Severe obstructive sleep apnea.   His past medical history in addition to the above, his current  medications are:  1. Glipizide 20 mg p.o. b.i.d.  2. Lisinopril 40 mg a day.  3. Metformin 850 mg p.o. t.i.d.  4. Metoprolol 50 mg p.o. b.i.d.  5. Plavix 75 mg per day.  6. Rosuvastatin 20 mg p.o. q.h.s.  7. Aspirin 325 mg a day.  8. Fish oil 1000 mg p.o. t.i.d.  9. He carries nitroglycerin.   FAMILY HISTORY:  Noncontributory.   SOCIAL HISTORY:  Please see previous chart.   REVIEW OF SYSTEMS:  He denies any presyncope or syncope.  His blood  pressure usually runs around 90/60.  In addition to the HPI there is no  significant or remarkable review of systems.   EXAMINATION:  His blood pressure is 92/60, his pulse and regular, his  weight is 264 and stable.  HEENT:  Normocephalic, atraumatic.  Extraocular movements are intact.  Sclerae are clear.   Conjunctivae are pale.  PERRLA.  He is missing some  teeth.  No obvious cavities.  NECK:  Supple.  Carotid upstrokes are equal bilaterally without bruits.  No JVD.  Thyroid is not enlarged.  Trachea is midline.  LUNGS:  Clear to auscultation.  HEART:  Reveals a poorly-appreciated PMI.  He has a soft S1, S2.  ABDOMEN:  Protuberant with good bowel sounds.  He has cutaneous  discoloration over the entire stomach area and abdominal area.  This was  present on his first exam.  EXTREMITIES:  Reveal 2+ pitting edema.  He has chronic brawny changes at  the ankles.  He has few varicosities.  There is no obvious DVT.  His  dorsalis pedis is 2+/4+, posterior tibial was trace.  He has some  dependent rubor.  SKIN:  He has  a lot of red purplish discoloration over his abdomen as  noted.  He has been biopsied and said this did not show any cutaneous  lymphoma per his record.  NEUROLOGIC:  Intact.   His stress Myoview on August 25, 2007, showed EF of 45% with  anteroapical hypokinesia, scar with periinfarct ischemia.   ASSESSMENT AND PLAN:  [name] is high-risk for having progressive  coronary artery disease.  I am concerned about his extreme dyspnea on  exertion and now positive Myoview.   I have explained to him the need for coronary angiography.  Indications,  risks, potential benefits have been discussed.  He would like to do it  week after next when he has a work break.  He will need a baseline  creatinine today to see if he is safe to proceed with this.     Thomas C. Daleen Squibb, MD, [facility]  Electronically Signed    TCW/MedQ  DD: 09/08/2007  DT: 09/08/2007  Job #: [id number]

## 2011-05-05 NOTE — Discharge Summary (Signed)
NAME:  [name], [name] NO.:  [id number]   MEDICAL RECORD NO.:  [id number]          PATIENT TYPE:  INP   LOCATION:  2041                         FACILITY:  [facility]   PHYSICIAN:  Barnetta Chapel, MDDATE OF BIRTH:  [birth year]   DATE OF ADMISSION:  09/14/2007  DATE OF DISCHARGE:  09/18/2007                               DISCHARGE SUMMARY   PRIMARY CARE PHYSICIAN:  Holley Bouche, M.D.   DISCHARGE DIAGNOSES:  1. Severe diffuse two-vessel disease (coronary artery disease).  2. Hyperkalemia.  3. Diabetes mellitus.  4. Hypertension.  5. Hyperlipidemia.  6. Obesity.   DISCHARGE MEDICATIONS:  1. Metformin 850 mg three times daily.  2. Glipizide 20 mg twice daily.  3. Metoprolol 50 mg p.o. twice daily.  4. Crestor 20 mg p.o. daily.  5. Aspirin 325 mg p.o. daily.   PROCEDURE:  cardiac catheterization.  The cardiac catheterization showed  diffuse LAD disease with an ostial lesion.   CONSULTATIONS:  Cardiac/thoracic surgery consultation:  The patient was  seen by Dr. Purcell Nails.  The plan is for the patient to be  admitted for surgery on September 22, 2007.  The patient is going to have a  coronary artery bypass graft surgery.   HISTORY OF PRESENT ILLNESS:  Refer to the H&P done on September 15, 2007.  The patient is a 65 year old male with a significant cardiac  history.  He has a history of hypertension, hyperlipidemia, diabetes and  obesity.  The patient was scheduled to have a cardiac catheterization  prior to admission.  The patient was seen at the cardiologist's office  for a pre-catheterization workup.  During the visit, the patient was  found to have elevated potassium.  On admission the potassium was 6.8.  The patient was admitted for workup and management of hyperkalemia and  possible cause for the hyperkalemia.  (The patient was on Lisinopril 40  mg p.o. daily prior to admission.)   HOSPITAL COURSE:  The patient was admitted to the regular medical  floor.  He was managed with Kayexalate.  The patient's potassium is back to  normal.  Lisinopril is on hold and the patient has been advised to avoid  ACE inhibitors.  The patient eventually had a cardiac catheterization  that revealed severe diffuse two-vessel disease (the LAD and ostial).  A  cardiothoracic surgery consultation was called and the patient was seen  by Dr. Cornelius Moras.  The plan is for the patient to be readmitted in four days  for admission for a coronary artery bypass graft surgery.   DISCHARGE PLAN:  1. To discharge the patient today.  2. Follow up with the primary care Lenea Bywater in two to four weeks.  3. Avoid ACE inhibitors for now.  4. Hold Plavix.  5. Follow up with the cardiologist.  6. Follow up with the cardiothoracic surgeon.  7. An ADA diet, 1800 kilocalories.  8. Activity as tolerated.  9. There is also a plan for the patient to have a pulmonary function      test in the morning (on September 19, 2007).      Jacqlyn Krauss  Rockwell Alexandria, MD  Electronically Signed     SIO/MEDQ  D:  09/18/2007  T:  09/18/2007  Job:  [id number]   cc:   Holley Bouche, M.D.  Thomas C. Daleen Squibb, MD, [facility]  Clarence H. Cornelius Moras, M.D.

## 2011-05-05 NOTE — Assessment & Plan Note (Signed)
[facility]                            CARDIOLOGY OFFICE NOTE   NAME:Artiga, LONNELL CHAPUT                    MRN:          [id number]  DATE:08/17/2007                            DOB:          03-31-1946    [name] returns today for followup.  It has been 2 years since he  has been seen in the office.  He is a former patient of Dr. Kayren Eaves, who has moved to [place].   PROBLEM LIST:  1. Status post acute anterior wall myocardial infarction.  He is      status post stenting of the left anterior descending.  His post-      interventional echocardiographic ejection fraction improved from      35% to about 45% to 50%.  He specifically had a cutting balloon and      percutaneous transluminal coronary angioplasty of the first      diagonal and then had a stent deployed in the first diagonal.  He      then also had a Cypher stent placed in the proximal left anterior      descending.  He is having profound dyspnea on exertion.  He went to      [facility] and said he was breathing extremely hard.  2. Type 2 diabetes.  This is followed at the [facility].      His last hemoglobin A1c was 7.5%.  3. Morbid obesity.  4. Hyperlipidemia, followed at the [facility].  5. Severe obstructive sleep apnea.   MEDICATIONS:  1. Glipizide 20 mg b.i.d.  2. Lisinopril 40 mg a day.  3. Metformin 850 mg p.o. t.i.d.  4. Metoprolol 50 mg b.i.d.  5. Plavix 75 mg a day.  6. Rosuvastatin 20 mg nightly.  7. Aspirin 325 mg a day.  8. Fish oil 1000 mg t.i.d.  9. He carries nitroglycerin.  He asked me what they did today and I      explained it.   We also reviewed calling 911 and how to recognize an acute coronary  syndrome if it occurs again.   He denies any presyncope or syncope.  His blood pressure usually runs  around 90/60.   PHYSICAL EXAMINATION:  His blood pressure is 86/68, his pulse 83 and  regular.  His EKG is normal except  for low voltage.  He has somewhat  delayed R-wave progression across the anterior precordium.  He weighs  265 pounds.  HEENT:  Normocephalic, atraumatic.  PERRLA.  Extraocular movements  intact.  Sclerae are clear.  Conjunctivae are pale.  PERRLA.  He is  missing some teeth, but no obvious cavities.  NECK:  Supple.  Carotid upstrokes are equal bilaterally without bruits.  No JVD.  Thyroid is not enlarged.  Trachea is midline.  LUNGS:  Clear to auscultation bilaterally.  HEART:  Reveals a poorly appreciated PMI.  He has a soft S1 and S2.  ABDOMEN:  Protuberant with good bowel sounds and no midline bruit.  No  hepatomegaly.  EXTREMITIES:  Edema 1+.  He has chronic brawny changes at his ankles.  He has a few venous varicosities.  There is no obvious DVT.  His  dorsalis pedis is 2+/4+.  Posterior tibial is trace.  He has some  dependent rubor.  SKIN:  He has a lot of red, purplish discoloration over his abdomen.  He  has been biopsied and did not show any cutaneous lymphoma per his  record.  NEUROLOGIC:  Exam is intact.   ASSESSMENT:  1. Known coronary artery disease, status post anterior wall infarct,      good recovery of left ventricular function.  He is having profound      dyspnea on exertion which could be multifactorial.  We need to rule      out obstructive coronary disease and he is way overdue an objective      assessment of his coronary artery disease.  2. Other risk factors as mentioned above.   I spent 20 minutes plus talking to Mr. Breon about risk factor  modification, its impact on future cardiovascular events as well as  stroke, which he says he has also had by the way, and also the need for  him to have an exercise rest/stress Myoview.  He has also been told of  the importance of nitroglycerin and how to call 911.   I will plan on seeing him back in a year if his stress Myoview shows no  ischemia.  If he has ischemia, he will need a heart catheterization,   assuming his creatinine is normal.     Maisie Fus C. Daleen Squibb, MD, [facility]  Electronically Signed    TCW/MedQ  DD: 08/17/2007  DT: 08/18/2007  Job #: [id number]

## 2011-05-05 NOTE — H&P (Signed)
NAME:  [name], [name] NO.:  [id number]   MEDICAL RECORD NO.:  [id number]          PATIENT TYPE:  EMS   LOCATION:  MAJO                         FACILITY:  [facility]   PHYSICIAN:  Mick Sell, MD DATE OF BIRTH:  [birth year]   DATE OF ADMISSION:  09/15/2007  DATE OF DISCHARGE:                              HISTORY & PHYSICAL   PRIMARY PHYSICIAN:  Dr. Tiburcio Pea.   CHIEF COMPLAINT:  Told to come to the ER to evaluate a potassium level.   HISTORY OF PRESENT ILLNESS:  [name] is a very pleasant 65-  year-old white male with a history of diabetes, hyperlipidemia, CAD with  myocardial infarction 2005, requiring PTCA and 2 stent placements, who  was being seen in his primary doctor's office today for routine  preprocedure blood work for cardiac catheterization he is undergoing in  2 days.  His blood work came back and revealed an elevated potassium  level of 6.8.  Of note it had been checked on September 08, 2007, and  level was normal at 4.6.  Given the elevated potassium of 6.8, the  patient was called and asked to come to the emergency room to be  evaluated. Repeat potassium was 6.3 and he was given Kayexalate but the  potassium only came down to 6.0.   Of note, the patient is currently without any chest pain or  palpitations.  He does report that he has had some shortness of breath  that is near his baseline.  He does say he has been increasing his  physical activity over the last week, walking more on a treadmill.  He  does have some muscle tenderness but mainly only in his hips.  He also  has made some changes in his diet where he has cut out meat, but does  not note any other changes in his consumption of fruits or vegetables or  juices.  He is using a salt substitute called No Salt but is not sure  what that contains and has not changed the amount he has been using.  He  has also had no changes in his medication regimen.  He denies any other  symptoms  including fevers, chills, weight loss, night sweats, nausea,  vomiting, abdominal pain or diarrhea.   PAST MEDICAL HISTORY:  1. Myocardial infarction October 2005, requiring PTCA and stent      placement times 2.  2. Hypertension.  3. Hyperlipidemia.  4. Diabetes type 2.  5. Obesity.  6. Skin cancer with basal cell cancer.  7. History of appendectomy.  8. History of hernia repair.  9. Abnormal stress test over the last several weeks.  The patient is      being scheduled for a cardiac catheterization in 2 days.   SOCIAL HISTORY:  The patient works as an Airline pilot.  He does not smoke  or drink and lives with his wife.   FAMILY HISTORY:  His mother had diabetes and a stroke and his sister has  cancer.   ALLERGIES:  NO KNOWN DRUG ALLERGIES.   MEDICATIONS:  1. Aspirin 325 mg once a day.Marland Kitchen  2. Crestor 20 mg once a day.  3. Glipizide 20 mg b.i.d.  4. Lisinopril 40 mg once a day.  5. Metformin 850 t.i.d., 2 in the morning and 1 in the evening.  6. Metoprolol 50 mg b.i.d.  7. Plavix 75 mg once a day.  8. Omega 3 fatty acid pills.   ALLERGIES:  NO KNOWN DRUG ALLERGIES.   REVIEW OF SYSTEMS:  Eleven systems reviewed and negative except as per  HPI.   PHYSICAL EXAMINATION:  GENERAL:  The patient is a very pleasant obese  white male in no acute distress.  VITAL SIGNS:  Temperature 97.6, pulse 101, blood pressure 110/65,  respirations 24, saturating 97% on room air.  HEENT:  Pupils equal, round and reactive to light and accommodation.  Extraocular movements are intact.  Sclerae anicteric.  Oropharynx clear.  NECK:  Supple.  HEART:  Regular with no murmurs, rubs or gallops.  LUNGS:  Clear to auscultation bilaterally.  Abdomen:  Soft, nontender, nondistended. No hepatosplenomegaly.  EXTREMITIES:  He has trace bilateral pedal edema.  NEUROLOGIC:  He is alert and oriented times 3 with a grossly nonfocal  neuro exam.   DATA:  The patient has blood work which reveals a white count  8.0,  hemoglobin 14.5, platelets 188, PT 10.9, INR 0.8, PTT 26.4.  His blood  work this morning revealed a sodium 142, potassium 6.8, chloride 110,  CO2 27, BUN 19, creatinine 1.2 which appears to be his baseline.  Glucose is 79, calcium 9.5.  Repeat done in the ER with stat labs  revealed a potassium of 6.3.  He was given Kayexalate and several hours  following that his sodium was 135, potassium 6.0, chloride 104, CO2 22,  BUN 20, creatinine 1.2, glucose 202, calcium 9.4.  EKG done today  compared to 1 done October 11, 2004, reveals normal sinus rhythm with  somewhat low voltage QRS.  There is poor R-wave progression in the  precordial leads but this is old compared to October 2005.  No other ST  or T-wave changes noted.  Notably there is no QRS widening or peaked T  waves.   ASSESSMENT:  This is a pleasant 65 year old man with a significant  cardiac history including a recent abnormal stress test and a history of  diabetes, hypertension, and obesity who on routine lab check today for  preprocedure for a cath had a markedly elevated potassium level to 6.8.  This was repeated and remains elevated.  He is asymptomatic, does not  have any EKG changes, however, it has failed to come down following  conservative attempt at Kayexalate.   The differential for his hyperkalemia is unclear.  He is on lisinopril  which can definitely cause hyperkalemia, although I might expect to see  a bump in his creatinine as well.  Other thoughts would be if he were  mildly dehydrated with his increasing exercise over the last week or if  he had any muscle breakdown he could have an increase in his potassium.  He also uses some salt substitute, although I have been unable to  discover if that contains potassium but he has no other real changes in  his diet.   PLAN:  1. We will admit to telemetry for observation.  2. We will give him more doses of Kayexalate until he has a bowel      movement and repeat  his potassium level in 4  or 5 hours.  3. We will give him some gentleIV  fluids.  4. We will hold his lisinopril dosing.  If his potassium level does      not improve to normal, we will consider renal consult.  5. Diabetes.  We will hold his metformin as he will be undergoing a      cardiac catheterization.  We will place him on glipizide 20 mg      twice a day as outpatient dose and cover him with sliding-scale      insulin.  6. Hypertension.  Place him on his metoprolol but hold his lisinopril      as above.  7. Coronary artery disease.  Continue him on his aspirin and Plavix.      He is also on Crestor and will be scheduled for catheterization as      an outpatient on Friday.      Mick Sell, MD  Electronically Signed     DPF/MEDQ  D:  09/15/2007  T:  09/15/2007  Job:  [id number]

## 2011-05-05 NOTE — Consult Note (Signed)
NAME:  [name], [name] NO.:  [id number]   MEDICAL RECORD NO.:  [id number]          PATIENT TYPE:  INP   LOCATION:  2041                         FACILITY:  [facility]   PHYSICIAN:  Salvatore Decent. Cornelius Moras, M.D. DATE OF BIRTH:  [birth year]   DATE OF CONSULTATION:  09/16/2007  DATE OF DISCHARGE:                                 CONSULTATION   DATE OF PLANNED HOSPITAL ADMISSION FOR SURGERY:  September 22, 2007.   REASON FOR CONSULTATION:  Severe two-vessel coronary artery disease.   HISTORY OF PRESENT ILLNESS:  Mr. Mollica is a 65 year old obese white  male with known history of coronary artery disease, type 2 diabetes  mellitus, obesity, hyperlipidemia, and obstructive sleep apnea.  The  patient's cardiac history dates back to October 2005, at which time he  suffered an acute anterior wall myocardial infarction.  He was treated  with cutting balloon angioplasty of the left anterior descending  coronary artery and first diagonal branch with Cypher stent placement.  He initially did well.  Recently he has been followed by Dr. Juanito Doom,  and in late August he reported severe exertional shortness of breath  that developed while he was on a family hike climbing [place].  In  retrospect, the patient admits to progressive exertional shortness of  breath.  He has not had any chest pain or chest tightness.  He underwent  a stress Myoview exam on September 4 demonstrating resting ejection  fraction of 45% with anterior apical hypokinesia, scar and peri-infarct  ischemia.  He was subsequently scheduled for elective cardiac  catheterization.  However, on pre-catheterization lab, the patient was  noted to have a high potassium level, and subsequently he was admitted  to the hospital yesterday for further evaluation and treatment of his  hyperkalemia.  This resolved with Kayexalate administration, and the  patient underwent elective cardiac catheterization today by Dr. Nicholes Mango.  He  was found to have severe two-vessel coronary artery  disease including high-grade restenosis of the ostial portion of the  left anterior descending coronary artery, as well as high-grade stenosis  of a large first circumflex marginal branch.  There is mild left  ventricular dysfunction with anterior apical hypokinesis.  Cardiac  surgical consultation was requested.   REVIEW OF SYSTEMS:  GENERAL:  The patient reports normal appetite.  Over  the last 2 weeks he has been trying to stick to a very strict diet  prescribed by Dr. Daleen Squibb and he has actively been able to lose 6 or 8  pounds during this short period of time.  He does admit to gradual  progression of exertional fatigue over the last couple of years.  CARDIAC:  Notable for exertional shortness of breath.  The patient  denies any exertional chest pain or chest tightness either with activity  or at rest.  He did not develop chest pain during his stress test.  The  patient denies palpitations or syncope.  The patient has had some lower  extremity edema that has improved recently.  He denies PND and  orthopnea.  RESPIRATORY:  Notable for exertional shortness of  breath.  The patient has obstructive sleep apnea and uses a CPAP machine at  night.  He denies productive cough, hemoptysis, wheezing.  GASTROINTESTINAL:  Negative.  The patient has no difficulty swallowing.  He denies symptoms of reflux.  He reports no hematochezia, hematemesis,  melena.  His bowel function is regular.  MUSCULOSKELETAL:  Negative.  The patient denies significant problems with arthritis or arthralgias.  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  The patient sustained a stroke in September 1969  but has no residual deficits.  He denies any transient monocular  blindness or transient numbness or weakness involving one side of the  body or the  other.  GENITOURINARY:  Negative.  HEENT:  Negative.  ENDOCRINE:  Notable for type 2 diabetes mellitus.  The patient checks  his blood sugars every morning and reports they have been under good  control.   PAST MEDICAL HISTORY:  1. Coronary artery disease, status post acute anterior wall myocardial      infarction October 2005, treated with PCI and stenting.  2. Cerebrovascular disease, status post stroke, September 2005, no      residual.  3. Type 2 diabetes mellitus.  4. Morbid obesity.  5. Obstructive sleep apnea.  6. Hyperlipidemia.  7. Hyperkalemia prompting this hospital admission, resolved.   PAST SURGICAL HISTORY:  1. Umbilical hernia repair.  2. Appendectomy.  3. Excision of cyst during childhood.   FAMILY HISTORY:  Notable for the absence of premature coronary artery  disease.   SOCIAL HISTORY:  The patient is married and lives with his wife here in  [place].  He works as an Airline pilot.  He lives a sedentary lifestyle.  He is a nonsmoker.  He denies excessive alcohol consumption.   MEDICATIONS PRIOR TO ADMISSION:  1. Aspirin 325 mg daily.  2. Crestor 20 mg daily.  3. Glipizide 20 mg twice daily.  4. Lisinopril 40 mg daily.  5. Metformin 850 mg three times daily, two the morning, one in the      evening.  6. Metoprolol 50 mg twice daily.  7. Plavix 75 mg daily.  8. Omega-3 fatty acid supplement.   DRUG ALLERGIES:  None known.   PHYSICAL EXAM:  The patient is well-appearing, obese male who appears  his stated age, in no acute distress.  He is normotensive.  He is afebrile.  HEENT:  Unrevealing.  Auscultation of the chest reveals diminished breath sounds at both lung  bases.  No wheezes or rales are noted.  CARDIOVASCULAR:  Regular rate  and rhythm.  No murmurs, rubs or gallops are appreciated.  ABDOMEN:  Soft, nontender.  Bowel sounds are present.  The liver edge is  not palpable.  There are no palpable masses.  EXTREMITIES:  Warm and adequately perfused.   There is mild bilateral  lower extremity edema.  Distal pulses are not palpable on either lower  leg at the ankle.  There are some mild changes hemosiderosis consistent  with chronic microvascular disease in both feet.  NEUROLOGIC:  Grossly nonfocal and symmetrical throughout.  SKIN:  Clean and dry throughout.  RECTAL:  Deferred.  GU:  Deferred.   DIAGNOSTIC TESTS:  Cardiac catheterization performed by Dr. Gala Romney is  reviewed.  This demonstrates severe two-vessel coronary artery disease  with high-grade 70-80% ostial stenosis of the left anterior descending  coronary artery.  There is 80% stenosis of  the proximal portion of the  first diagonal branch.  The distal left anterior descending coronary  artery has 90% stenosis and is quite small and appears diffusely  diseased.  The left circumflex coronary artery gives rise to a single  large first circumflex marginal branch that has 95% proximal stenosis.  There is 30-40% proximal stenosis of the right coronary artery with  right-dominant coronary circulation.  Left ventricular function is  mildly reduced with anteroapical hypokinesis.   IMPRESSION:  Severe two-vessel coronary artery disease with mild left  ventricular dysfunction, coronary anatomy unfavorable for percutaneous  coronary intervention.  Mr. Fergusson had a recent abnormal stress test.  He has symptoms of exertional shortness of breath.  I suspect that he  would probably benefit from surgical revascularization, primarily from  associated decreased risk of future myocardial infarction and  potentially for improved long-term survival.  He might benefit in terms  of symptomatic improvement, although this is less certain.  His risks of  surgery will be somewhat elevated due to his obesity, the diabetes,  chronic lung disease, and cerebrovascular disease.   PLAN:  I have discussed matters at length with Mr. Gobert and his wife.  Alternative treatment strategies have been  discussed.  We tentatively  plan to proceed with surgery on Thursday October 2.  We will postpone  surgery until then to allow the effects of Plavix to dissipate from his  system.  Under the circumstances I think it would be reasonable to let  him go home during the interim period of time and return for surgery on  the morning of surgery.  All of his questions have been addressed.  They  understand and accept all associated risks of surgery including but not  limited to risks of death, stroke, myocardial infarction, congestive  heart failure, respiratory failure, pneumonia, bleeding requiring blood  transfusion, arrhythmia, infection, and recurrent coronary artery  disease.      Salvatore Decent. Cornelius Moras, M.D.  Electronically Signed     CHO/MEDQ  D:  09/16/2007  T:  09/17/2007  Job:  [id number]   cc:   [name] Sans. Daleen Squibb, MD, [facility]  Bevelyn Buckles. Bensimhon, MD  Holley Bouche, M.D.

## 2011-05-05 NOTE — Cardiovascular Report (Signed)
NAME:  ZACCHARY, POMPEI NO.:  [id number]   MEDICAL RECORD NO.:  [id number]          PATIENT TYPE:  INP   LOCATION:  2041                         FACILITY:  [facility]   PHYSICIAN:  Bevelyn Buckles. Bensimhon, MDDATE OF BIRTH:  1946/04/23   DATE OF PROCEDURE:  09/16/2007  DATE OF DISCHARGE:                            CARDIAC CATHETERIZATION   PRIMARY CARE PHYSICIAN:  Dr. Holley Bouche   CARDIOLOGIST:  Dr. Juanito Doom   PATIENT IDENTIFICATION:  Mr. Leonhardt is a very pleasant 65 year old male  with a history of hypertension, hyperlipidemia, diabetes and obesity.  He also has coronary artery disease and suffered an anterior wall  infarction in 2005 and was treated with stenting of his LAD by Dr.  Samule Ohm.  He has been doing rather well recently.  He got a routine  screening treadmill test with a Myoview which was positive.  He was  scheduled for an outpatient catheterization.  However, he was found to  have hyperkalemia and admitted, thus his catheterization was a performed  as an inpatient.   PROCEDURES PERFORMED:  1. Selective coronary angiography.  2. Left heart catheterization.  3. Left ventriculogram.  4. Abdominal aortogram.  5. Left subclavian angiography for possible CABG.  6. Angio-Seal femoral artery closure.   DESCRIPTION OF PROCEDURE:  Risks and indications of the procedure were  explained.  Consent was signed and placed on the chart.  A 5-French  arterial sheath was placed in the right femoral artery using a modified  Seldinger technique.  Standard catheters including a JL-4, JR-4 and  angled pigtail used for the procedure.  All catheter exchanges were made  over a wire.  There are no apparent complications.   HEMODYNAMIC RESULTS:  Central aortic pressure 135/79 with a mean of 105.  LV pressure 129/90 with an EDP of 16.  No aortic stenosis.   Left main was normal.   LAD tapered significantly and was a very small vessel distally.  It gave  off a moderate to  large first diagonal and moderate to large second  diagonal which served much of the true LAD territory.  There is a long  stent from the proximal through the mid portion of the LAD and went into  what we are calling the second diagonal, which serves the LAD territory.  The stent was widely patent.  Proximal to the stent in the very ostial  LAD there was a 60-70% hazy lesion.  In the mid portion of the very  small LAD there was a 90% lesion.  In the first diagonal there was a  long tubular 80% lesion.  In the large second diagonal right after the  stent there was a 90% lesion.   Left circumflex was a large vessel.  It gave off a tiny ramus, a large  OM-1, a small OM-2 and two posterolaterals.  There was a 60% lesion in  the mid AV groove circumflex after the takeoff of the OM-1 and a 30%  lesion more distally.  In the proximal portion of the large OM-1 there  was a 99% focal lesion.  Right coronary artery was a  large dominant  vessel.  It gave off a PDA and several small posterolaterals.  There was  a 40% lesion throughout the mid RCA.   Left ventriculogram done in the RAO position showed an ejection fraction  of 60% with no obvious wall motion abnormalities.   Abdominal aortogram:  There was poor opacification of the aorta but  bilateral renal arteries appeared free of critical stenosis.   Left subclavian angiography revealed patent subclavian and patent LIMA.   ASSESSMENT:  1. Diffuse two-vessel coronary artery disease.  2. Patent left anterior descending artery stent.  3. Normal left ventricular function.  4. Normal renal arteries.   Given his diffuse LAD disease with ostial lesion in the face of  longstanding diabetes, I would favor cardiothoracic surgery referral for  possible bypass surgery.  I reviewed the films with Dr. Juanda Chance, who  agrees.      Bevelyn Buckles. Bensimhon, MD  Electronically Signed     DRB/MEDQ  D:  09/16/2007  T:  09/16/2007  Job:  [id number]   cc:    Melida Quitter, M.D.  Thomas C. Wall, MD, [facility]

## 2011-05-05 NOTE — Op Note (Signed)
NAME:  BRENNER, VISCONTI NO.:  [id number]   MEDICAL RECORD NO.:  [id number]          PATIENT TYPE:  OUT   LOCATION:  PULM                         FACILITY:  [facility]   PHYSICIAN:  Burna Forts, M.D.DATE OF BIRTH:  14-Mar-1946   DATE OF PROCEDURE:  09/22/2007  DATE OF DISCHARGE:                               OPERATIVE REPORT   TRANSESOPHAGEAL ECHOCARDIOGRAPHIC REPORT:   INDICATIONS FOR PROCEDURE:  Mr. Englert is a 65 year old gentleman who  presents today for coronary artery bypass grafting.  We have been asked  by Dr. Cornelius Moras to pass the TE probe for evaluation of cardiac structures  and function.   The patient arrived to the holding area where under local anesthesia and  sedation, PA catheters are placed.  Our radial and arterial lines are  placed.  He is then taken to the OR for routine induction of general  anesthesia.   After induction, the TE probe was protected, lubricated and passed  oropharyngeally into the stomach and then slightly withdrawn for imaging  of the cardiac structures.   PRECARDIOPULMONARY BYPASS EXAMINATION:  Left ventricle:  This is an  essentially normal left ventricular chamber in size and function seen in  a short axis view as well as long axis view.  Both short and long axis  views revealed normal overall contractility with ejection fraction  estimated greater than 50%.  Papillary muscles are well outlined.  There  are no masses noted within.  Mitral valve:  This is a thin, compliant, mobile mitral valve apparatus,  opens appropriately during diastolic filling and closes satisfactory  during systolic ejection.  Color Doppler over this reveals essentially  no regurgitant flow.  Left atrium:  The left atrial chamber is noted.  No masses were noted  within.  The appendage is seen.  There are no masses or structures  within the appendage.  The inner atrial septum was interrogated and is  intact.  Aortic valve:  Thin, compliant, mobile  3-cusp aortic valve.  Edges are  clear and all outlined and opened appropriately during systolic  contraction and closes satisfactorily without any diastolic regurgitant  flow noted on color Doppler examination.  Right ventricle:  Somewhat enlarged right ventricular chamber.  Overall  contractile pattern is noted and is normal.  Tricuspid valve:  Thin, compliant, mobile tricuspid valve.  Right atrium:  Right atrium has essentially normal chamber and size and  function, however, in the superior aspect is seen a filamentous  elongated structure which appears to be a remnant of the eustachian  valve.  This is essentially a fairly normal finding in some people and  does not indicate any pathology.   The patient is placed on cardiopulmonary bypass.  Coronary artery bypass  grafting is carried out and then separated from the cardiopulmonary  bypass with the initial attempt.   POSTCARDIOPULMONARY TE EXAMINATION:  Left ventricle:  The left  ventricular chamber in the post-bypass period has just mildly  dyssynergic chamber in the early bypass period, however, improved with  time from separation of cardiopulmonary bypass.  Short axis views and  long axis views were  obtained revealing good overall contractile  patterns in all segments of well areas at this time.   The rest of the cardiac examination was as previously described without  any essential changes or abnormalities.  There was trace MR noted in  this early post-bypass period, however, it was not significant.  The  patient was returned to the cardiac intensive care unit in stable  condition.           ______________________________  Burna Forts, M.D.     JTM/MEDQ  D:  09/22/2007  T:  09/22/2007  Job:  [id number]   cc:   Burna Forts, M.D.

## 2011-05-05 NOTE — Assessment & Plan Note (Signed)
[name]                            CARDIOLOGY OFFICE NOTE   NAME:[name], [name]                    MRN:          [id number]  DATE:05/25/2008                            DOB:          Dec 02, 1946    [name] comes in today for further management.   PROBLEM LIST:  1. Coronary artery disease status post coronary bypass grafting.  2. Normal left ventricular systolic function.  3. Obesity.  4. Type 2 diabetes which he says has been under better control.  5. Hypertension which has also been under good control.  6. Mixed hyperlipidemia.   He has been gaining weight though he says he is compliant with his diet.  His weight is up about 15 pounds.  He knows he is retaining fluid  particularly in his abdomen, his hands, and his feet.   He denies any orthopnea, PND.  He has had no chest pain or angina.  He  does have some soreness in his chest incision at times.   MEDS:  1. Glipizide 20 mg p.o. b.i.d.  2. Metformin 150 mg t.i.d.  3. Rosuvastatin 20 mg nightly.  4. Aspirin 325 mg a day.  5. Fish oil 1000 mg t.i.d.  6. Metoprolol 75 b.i.d.   EXAM:  He is very pleasant and humorous.  His blood pressure 100/70.  His pulse is 60 and regular.  Weight is 251.  HEENT is baseline with no changes.  Carotids are full without bruits.  There is no obvious JVD.  LUNGS:  Clear.  HEART:  Reveals a poorly appreciated PMI.  Sternotomy site is well-  healed.  He has a soft S1-S2 without gallop.  ABDOMEN:  Exam is protuberant.  Organomegaly was impossible to assess.  He has some abdominal wall pitting edema.  His hands and feet also show 1+ pitting edema.  Pulses were present but  reduced.  He has some dependent rubor.  No sign of DVT.  MUSCULOSKELETAL:  Chronic arthritic changes.  SKIN:  A few ecchymoses, otherwise benign.   Ms. Doolittle probably has retained some fluid.  He is also way overdue  blood work historically.   PLAN:  1. Begin Furosemide 20 mg  q. a.m.  2. Watch sodium.  3. Stay well hydrated.  4. Check fasting CMP, lipids, and hemoglobin A1c.   I will plan on seeing him back in about 4 to 6 weeks.   NOTE:  His last creatinine was about 1.3 to 1.5.     Thomas C. Daleen Squibb, MD, [facility]  Electronically Signed    TCW/MedQ  DD: 05/25/2008  DT: 05/25/2008  Job #: [id number]

## 2011-05-05 NOTE — Assessment & Plan Note (Signed)
[facility]                            CARDIOLOGY OFFICE NOTE   NAME:Sterbenz, LINDSAY SOULLIERE                    MRN:          [id number]  DATE:11/23/2007                            DOB:          07-Feb-1946    Mr. Vicuna returns today for close followup, status post coronary  artery bypass grafting.  He would like to have surgical clearance for  [facility] with Dr. Amanda Pea for elective A1 pulley  tenosynovitis of right ring finger.  His weight is down an additional  couple of pounds.  The last time we saw him, we increased his metoprolol  to 75 mg b.i.d.  He is having no angina.   He is not on ACE inhibitor because his potassium went up to 6.8.   PHYSICAL EXAMINATION:  VITAL SIGNS:  Blood pressure today is 122/75,  pulse 89 and regular.  Weight 238 pounds.  HEENT:  Unchanged.  His skin color looks better.  LUNGS:  Respiratory rate is 18.  NECK:  Carotid upstrokes equal bilaterally without bruits, no JVD.  Thyroid is not enlarged.  Trachea midline.  LUNGS:  Clear.  HEART:  Poorly appreciated PMI.  Sternotomy site is healed.  ABDOMEN:  Still protuberant.  Discoloration over his anterior abdominal  wall has improved.  EXTREMITIES:  No edema.  Pulses are intact.  NEUROLOGIC:  Intact.   ASSESSMENT/PLAN:  I have cleared Mr. Mcgowan for surgery.  I think he is  low risk.   [facility]  [facility] for lipids, LFTs, comprehensive metabolic panel and hemoglobin M5H.  He will continue with his current medications.  We will plan on seeing  back again in 3 months.     Thomas C. Daleen Squibb, MD, [facility]  Electronically Signed    TCW/MedQ  DD: 11/23/2007  DT: 11/24/2007  Job #: [id number]

## 2011-05-05 NOTE — Discharge Summary (Signed)
NAME:  [name], [name] NO.:  [id number]   MEDICAL RECORD NO.:  [id number]          PATIENT TYPE:  INP   LOCATION:  2017                         FACILITY:  [facility]   PHYSICIAN:  Salvatore Decent. Cornelius Moras, M.D. DATE OF BIRTH:  [birth year]   DATE OF ADMISSION:  09/22/2007  DATE OF DISCHARGE:  09/27/2007                               DISCHARGE SUMMARY   FINAL DIAGNOSIS:  Severe two-vessel coronary artery disease.   IN-HOSPITAL DIAGNOSES:  1. Acute renal insufficiency postoperatively.  2. Volume overload postoperatively.  3. Acute blood loss anemia postoperatively.   SECONDARY DIAGNOSES:  1. Coronary artery disease, status post acute anterior wall myocardial      infarction in October 2005, treated with percutaneous coronary      intervention and stenting.  2. Cerebrovascular disease, status post stroke, September 2005, with      no residual.  3. Type 2 diabetes mellitus.  4. Morbid obesity.  5. Obstructive sleep apnea.  6. Hyperlipidemia.  7. Hyperkalemia.  8. Status post umbilical hernia repair.  9. Status post appendectomy.  10.Status post excision of cyst during childhood.   IN-HOSPITAL OPERATIONS AND PROCEDURES:  1. Coronary bypass grafting x3 using a left internal mammary artery to      distal left anterior descending coronary artery, saphenous vein      graft to first diagonal branch, saphenous vein graft to circumflex      marginal branch, endoscopic saphenous vein harvest from right      thigh.  2. Transesophageal echocardiogram.   HISTORY AND PHYSICAL AND HOSPITAL COURSE:  The patient is a 65 year old  obese white male with known history of coronary artery disease, diabetes  mellitus, type 2, obesity, hyperlipidemia, and chronic obstructive sleep  apnea.  The patient's cardiac history dates back to October 2005 with at  which time he suffered an acute anterior wall myocardial infarction.  He  was treated with a cutting balloon angioplasty and stent placement  to  the left anterior descending coronary artery and first diagonal branch.  Initially, the patient did well.  Recently, he presents with symptoms of  severe exertional shortness of breath.  He underwent a stress Myoview  exam that was abnormal, prompting cardiac catheterization, which was  performed September 16, 2007; this demonstrated mild to moderate left  ventricular dysfunction with severe two-vessel coronary artery disease  including restenosis of the left anterior descending coronary artery.  The patient was seen and evaluated by Dr. Cornelius Moras post catheterization.  Dr. Cornelius Moras discussed with the patient undergoing coronary bypass grafting.  He discussed risks and benefits.  The patient acknowledged understanding  and agreed to proceed.  Surgery was scheduled for September 22, 2007.  For  details of the patient's past medical history and physical exam, please  see dictated H&P.   The patient was taken to the operating room September 22, 2007, where he  underwent coronary bypass grafting x3 using a left internal mammary  artery to the distal left anterior descending coronary artery, a  saphenous vein graft to the first diagonal branch, saphenous vein graft  to the circumflex marginal branch, and endoscopic  saphenous vein harvest  from the right thigh.  The patient tolerated this procedure well and was  transferred to the intensive care unit in stable condition.  Postoperatively, the patient was noted to be hemodynamically stable.  He  was extubated the evening of surgery.  Post extubation, the patient was  noted to be alert and oriented x4, neurologically intact.  Postop day  #1, the patient's vital signs were noted to be stable.  He was able to  be weaned from all drips.  Swan-Ganz catheter was discontinued in a  normal fashion.  Postoperative chest x-ray was stable.  He had minimal  drainage from chest tube.  Chest tube was discontinued in a normal  fashion.  The patient was out of bed  ambulating well with Rehab.  He did  have slight acute blood loss anemia with hemoglobin and hematocrit of  10.1 and 30%; this was monitored.  The patient also had mild volume  overload and was started on diuretics.  He was transferred out to PCTU  postop day #1.  While on telemetry floor, the patient's vital signs were  continued to be monitored.  They did remain stable.  He remained  afebrile.  He was able to be weaned off oxygen, saturating greater than  90% prior to discharge home.  Patient's hemoglobin and hematocrit  continued to be followed; they remained stable and the patient remained  asymptomatic.  The patient has a known history of diabetes mellitus and  his blood sugars were followed closely.  He was restarted on his  glyburide as well as his metformin.  Unfortunately, on postop day #4,  his creatinine level increased to 1.81.  The patient's metformin was  held, Lasix dose was decreased and lisinopril had been discontinued the  day prior.  Creatinine was followed up in the a.m. and did go back to  normal at 1.3.  The patient's blood sugars were slightly elevated, but  he was restarted on his metformin at that time.  The patient did have  volume overload, as stated above postoperatively.  He was continued on  diuretics.  His weight prior to discharge was 117 kg, which was 2 kg  above his preoperative weight.  Plan will be to continue him on  diuretics at time of discharge.  Postoperatively, the patient was out of  bed, ambulating well without difficulty.  He was tolerating diet well,  no nausea or vomiting noted.  He remained in normal sinus rhythm  postoperatively.  The patient's pulmonary status remained stable.  His  incisions were clean, dry and intact and healing well.   The patient was stable postop day #5, September 27, 2007, once ready for  discharge home.   FOLLOWUP APPOINTMENTS:  A followup appointment was arranged with Dr.  Cornelius Moras for October 24, 2007 at 2:45 p.m.  The  patient will need to obtain  a PA and lateral chest x-ray 30 minutes prior to this appointment.  The  patient will need to follow up with Dr. Daleen Squibb in 2 weeks; he will need to  contact Dr. Vern Claude office to make these arrangements.  The patient also  needs to follow up with Dr. Tiburcio Pea in 2-3 weeks and needs to call to  make this arrangement.   ACTIVITY:  The patient is instructed in no driving until released to do  so, and no lifting over 10 pounds.  He was told to ambulate 3-4 times  per day and progress as tolerated and to continue  his breathing  exercises.   INCISIONAL CARE:  The patient is told to shower, washing his incisions  using soap and water.  He is contact the office if he develops any  drainage or opening from any of his incision sites.   DIET:  The patient is educated on diet to be low-fat, low-salt.   DISCHARGE MEDICATIONS:  1. Aspirin 325 mg daily.  2. Lopressor 25 mg b.i.d.  3. Crestor 20 mg at night.  4. Lasix 40 mg daily for 7 days.  5. Potassium chloride 20 mEq daily for 7 days.  6. Glipizide 20 mg b.i.d.  7. Fish oil 1000 mg daily.  8. Metformin as taken at home.  9. Oxycodone 5 mg one to two tablets q.4-6 h. p.r.n. pain.      Theda Belfast, PA      Salvatore Decent. Cornelius Moras, M.D.  Electronically Signed    KMD/MEDQ  D:  09/27/2007  T:  09/28/2007  Job:  [id number]   cc:   Thomas C. Wall, MD, [facility]

## 2011-05-05 NOTE — Assessment & Plan Note (Signed)
[facility]                                 ON-CALL NOTE   NAME:[name], [name]                      MRN:          [id number]  DATE:09/09/2007                            DOB:          08-23-1946    CARDIOLOGIST:  Dr. Valera Castle.   PHONE NUMBER:  [phone].   HISTORY:  [name] is a 66 year old male patient followed by Dr. Daleen Squibb  with a history of Cypher stenting to the left anterior descending, as  well as angioplasty and stenting to the 1st diagonal in the setting of  anterior wall myocardial infarction, 2005, who recently saw Dr. Daleen Squibb  secondary to dyspnea on exertion.  His Cardiolite was abnormal and he  has been set up for cardiac catheterization next week.  The patient was  told to hold his metformin prior to his catheterization as well as his  glipizide on the day of the catheterization.  He wanted to know if he  should hold his aspirin and Plavix.   I explained to [name] that he should continue to take his aspirin  and Plavix in preparation for his cardiac catheterization.  He  understood my directions.   DISPOSITION:  As noted above.      Tereso Newcomer, PA-C  Electronically Signed      Gerrit Friends. Dietrich Pates, MD, [facility]  Electronically Signed   SW/MedQ  DD: 09/10/2007  DT: 09/10/2007  Job #: [id number]

## 2011-05-05 NOTE — Assessment & Plan Note (Signed)
[facility]                            CARDIOLOGY OFFICE NOTE   NAME:Rencher, DAION GINSBERG                    MRN:          [id number]  DATE:07/23/2008                            DOB:          11-11-1946    Mr. Mcnealy returns today for further management of his lower extremity  and generalized edema.   On his last visit, I placed him on furosemide 20 mg a day.  His weight  has remained about stable at 253.  His edema has not improved a lot.  He  has some response to furosemide, but not a lot.   He denies any orthopnea or PND.  He is having no angina.   MEDICATIONS:  His meds are unchanged since his last visit except he is  not sure of his metoprolol dose.  He was supposed to be on 75 b.i.d..  He will let us know when he gets home.   PHYSICAL EXAMINATION:  VITAL SIGNS:  His blood pressure today is 102/74,  his pulse is 72 and regular, his weight is 253.  HEENT:  Unchanged.  NECK:  Carotids upstrokes are equal bilaterally without bruits. Neck is  too thick to read JVD.  LUNGS:  Clear.  HEART:  Reveals a soft S1 and S2.  PMI could not be appreciated.  ABDOMEN:  Very protuberant, which is baseline.  EXTREMITIES:  Reveal no cyanosis or clubbing, but there is about 1+  edema in his hands and his feet.  Pulses are present.  He has dependent  rubor.  NEURO:  Intact.   LABORATORY DATA:  Recent blood work showed potassium of 4.4, BUN of 10,  and creatinine 1.1.  LFTs were normal except for AST of 45, which is  marginally increased with a normal SGPT.  His total cholesterol was 131,  triglycerides 184, HDL 34.6, and his LDL was 60.   DISCUSSION:  We had a long chat with Mr. Aigner.  I have encouraged him  to just not gain weight.  At this point, I have increased his furosemide  to 40 mg a day.  We will have come back for a chemistry profile to  follow up on his potassium in about 2 weeks.  I will see him back in  about 3 months.   He states that the  [place] thinks he has hypertension and he has never had  high blood pressure.  My initial evaluation shows that he did not have  hypertension as a problem.     Thomas C. Daleen Squibb, MD, [facility]  Electronically Signed    TCW/MedQ  DD: 07/23/2008  DT: 07/24/2008  Job #: [id number]

## 2011-05-05 NOTE — Assessment & Plan Note (Signed)
[facility]                            CARDIOLOGY OFFICE NOTE   NAME:Cendejas, ASEEL UHDE                    MRN:          [id number]  DATE:10/17/2007                            DOB:          [birth year]    Mr. Giannotti returns today after being discharged from the hospital  having had coronary artery bypass grafting.   He presented to me initially and was having significant dyspnea on  exertion.  He had had previous coronary disease as noted in my notes  September 08, 2007.  He underwent a stress Myoview which showed  significant ischemia in the anterior apical area.  He had an EF of 45%.   Cardiac catheterization was done which showed severe three vessel  disease.  He underwent coronary artery bypass grafting by Dr. Tressie Stalker.  A left internal mammary graft to the distal LAD, a vein graft to  the first diagonal, a vein graft to circumflex marginal.  His heart  looked good in the operating room with an EF 50 to 55%, he had a good  quality left internal mammary artery and a good saphenous vein graft  conduit.   He has done well since surgery.  He has gotten serious and has dropped  some weight.  His blood sugars are now running about 80 to 100.  Before  they were running 160 to 180 on the average.   His meds are:  1. Glipizide 20 mg p.o. b.i.d.  2. Metformin 850 mg p.o. t.i.d.  3. Metoprolol 50 mg p.o. b.i.d.  4. Rosuvastatin 20 mg nightly.  5. Aspirin 325 mg a day.  6. Fish Oil 1000 mg t.i.d.   His blood pressure today is 113/76, his pulse is 90 and regular, his  weight is 239, down from 265!  HEENT:  Normocephalic, atraumatic, PERRLA, extraocular movements intact.  He wears glasses.  He has a beard.  Carotid upstrokes are equal  bilaterally without bruits, no JVD, thyroid is not enlarged, trachea is  midline.  LUNGS:  Clear.  Sternotomy site is healing nicely.  He has a normal S1  S2 without gallop.  ABDOMINAL EXAM:  Slightly  protuberant, good bowel sounds.  He has this  discoloration of his abdomen and his legs that was here before,  unchanged.  LEGS:  Show no edema.  Pulses are intact.  NEURO EXAM:  Intact.  He has got a great sense of humor.   Please note that lisinopril caused hyperkalemia prior to his cath.   I have had a nice chat with Mr. Scarano and his wife.  We have talked  about cardiac rehab, which we will enroll him in.  Will let him go back  to work half-time.  Will also cut his glipizide to 15 mg p.o. b.i.d.  He  will continue to watch his blood sugars.  He may be able to come down  further, as his blood sugar continues to drift downward with weight  loss.  I have increased his metoprolol to 75 b.i.d. to get his resting  heart rate down.  I will plan on seeing him  back again in 3 months.     Thomas C. Daleen Squibb, MD, [facility]  Electronically Signed    TCW/MedQ  DD: 10/17/2007  DT: 10/18/2007  Job #: [id number]   cc:   Salvatore Decent. Cornelius Moras, M.D.

## 2011-05-08 NOTE — Procedures (Signed)
NAME:  [name], [name] NO.:  [id number]   MEDICAL RECORD NO.:  [id number]          PATIENT TYPE:  OUT   LOCATION:  SLEEP CENTER                 FACILITY:  [facility]   PHYSICIAN:  Marcelyn Bruins, M.D. [facility] DATE OF BIRTH:  11/24/1946   DATE OF STUDY:  03/27/2005                              NOCTURNAL POLYSOMNOGRAM   DATE OF STUDY:  March 27, 2005   REFERRING PHYSICIAN:  Dr. Marcelyn Bruins   INDICATION FOR THE STUDY:  Hypersomnia with sleep apnea.  Epworth Score:  6.   SLEEP ARCHITECTURE:  The patient had a total sleep time of 270 minutes with  very diminished REM and slow wave sleep.  Sleep onset latency was normal as  was REM onset.  Sleep efficiency was only 72%.   IMPRESSION:  1.  Very severe obstructive sleep apnea/hypopnea syndrome with a respiratory      disturbance index of 100 events per hour and oxygen desaturation as low      as 62%.  The events were clearly worse during rapid eye movement but      were not necessarily positional.  2.  Severe snoring noted throughout the study.  3.  No clinically significant cardiac arrhythmias.      KC/MEDQ  D:  04/13/2005 15:39:09  T:  04/13/2005 19:54:57  Job:  [id number]

## 2011-05-08 NOTE — H&P (Signed)
NAME:  [name], [name] NO.:  [id number]   MEDICAL RECORD NO.:  [id number]                   PATIENT TYPE:  INP   LOCATION:  0102                                 FACILITY:  [facility]   PHYSICIAN:  Kela Millin, M.D.             DATE OF BIRTH:  May 19, 1946   DATE OF ADMISSION:  08/27/2004  DATE OF DISCHARGE:                                HISTORY & PHYSICAL   PRIMARY CARE PHYSICIAN:  Dr. Meredith [name].   CHIEF COMPLAINT:  Dizziness and right-sided numbness.   HISTORY OF PRESENT ILLNESS:  The patient is a 65 year old white male with  past medical history significant for diabetes mellitus, who presents with  above complaints.  The patient states that early in the a.m. prior to  admission, he began experiencing numbness on his left side from his trunk  upwards including his right upper extremity (normal sensation in his right  leg and rest of body); he also states that he had some difficulty getting  his balance and felt dizzy -- lightheaded.  The patient's wife reports that  they drove from [place] on the day of presentation and she did all the  driving and the patient slept all through, and when they got to the ER, the  patient was slow to answer questions, which is unusual for him.  The patient  denies headaches, change in vision, palpitations, nausea/vomiting,  dysphagia, dysphasia, cough, fevers, weight loss, dysuria, chest pain,  melena, hematochezia; and no hematemesis.   The patient is admitted to the hospitalists' service for further evaluation  and management.   PAST MEDICAL HISTORY:  1.  As stated above.  2.  Obesity.  3.  Left knee injury -- status post fall.  4.  History of melanoma.   MEDICATIONS:  1.  Metformin 500 mg 2 p.o. b.i.d.  2.  Actos 30 mg 1 p.o. daily.  3.  Darvocet p.r.n.   SOCIAL HISTORY:  Denies tobacco, denies alcohol, also denies illicit drug  use.   FAMILY HISTORY:  Mom with diabetes mellitus and stroke,  sister with  cancer.   REVIEW OF SYSTEMS:  As per HPI, other comprehensive review of systems  negative.   PHYSICAL EXAM:  GENERAL:  In general, the patient is an obese white male,  alert and oriented, in no acute distress.  VITAL SIGNS:  His blood pressure is 129/85, pulse of 80, respiratory rate  20, O2 saturation 97%, temperature 97; his blood pressure was initially  142/87.  HEENT:  PERRL, EOMI; no facial asymmetry, tongue midline; no oral exudates,  moist mucous membranes.  Sclerae anicteric.  NECK:  Supple; no adenopathy, no thyromegaly, no carotid bruits appreciated.  LUNGS:  Clear to auscultation bilaterally, no crackles or wheezes.  CARDIOVASCULAR:  Regular rate and rhythm, normal S1 and S2.  ABDOMEN:  Obese, bowel sounds present, nontender and nondistended.  No  organomegaly and no masses palpable.  EXTREMITIES:  No cyanosis, no edema.  On the left knee, there is a  bony  prominence/swelling with mild tenderness.  NEUROLOGIC:  Alert and oriented x3, cranial nerves II-XII grossly intact,  sensory grossly intact, no Romberg sign, no pronator drift.  Strength 5/5.   LABORATORY DATA:  CT scan of head -- pending.   His white cell count is 6.4, hemoglobin is 14.8, hematocrit is 42.9,  platelet count is 216,000; chemistries within normal limits.   ASSESSMENT AND PLAN:  1.  Dizziness/right-sided numbness -- we will check a CT scan of brain      (patient initially refused a scan secondary to claustrophobia, but later      consented to do CT scan with premedication).  We will also obtain      carotid Dopplers in a.m.  Telemetry monitoring and cardiac enzymes.  We      will follow and consider neurological consultation versus further      outpatient workup pending above results.  2.  Diabetes mellitus -- continue Actos and Metformin, Accu-Chek monitoring,      also check fasting lipid profile.  3.  Left knee injury -- continue Darvocet p.r.n., patient to follow up with       [facility].  4.  Obesity.                                               Kela Millin, M.D.    ACV/MEDQ  D:  08/27/2004  T:  08/27/2004  Job:  [id number]   cc:   [name], M.D.  [address]  [place]  [place] [address]  Fax: [phone]

## 2011-05-08 NOTE — Cardiovascular Report (Signed)
NAME:  [name], [name] NO.:  [id number]   MEDICAL RECORD NO.:  [id number]          PATIENT TYPE:  INP   LOCATION:  2030                         FACILITY:  [facility]   PHYSICIAN:  Salvadore Farber, M.D. LHCDATE OF BIRTH:  [birth year]   DATE OF PROCEDURE:  10/09/2004  DATE OF DISCHARGE:                              CARDIAC CATHETERIZATION   PROCEDURE:  Left heart catheterization, left ventriculography, coronary  angiography, placement of intra-aortic balloon pump, drug-eluding stent  placement in the proximal LAD, balloon angioplasty of first diagonal branch.   INDICATIONS FOR PROCEDURE:  The patient is a 65 year old gentleman with  hypertension, dyslipidemia, and recently diagnosed diabetes mellitus, who  presents three hours into anterior myocardial infarction.  He was evaluated  in the emergency room and brought urgently to the cardiac catheterization  lab for diagnostic angiography with an eye to percutaneous  revascularization.   DESCRIPTION OF PROCEDURE:  Informed consent was obtained.  The patient  consented to participate in the Horizon Trial of comparing bivalirudin  versus heparin plus 2B3A inhibitor in acute myocardial infarction.  He was  randomized to the bivalirudin arm.  This was initiated per study protocol.   Under 1% lidocaine local anesthesia, a 6 French sheath was placed in the  right common femoral artery using the modified Seldinger technique.  Diagnostic angiography was performed initially using a JR4 catheter for the  right coronary.  Left heart catheterization and left ventriculography were  then performed.  This demonstrated anterolateral and anteroapical akinesis.  A 6 [place] CLS4 guide was advanced over a wire and engaged in the ostium of  the left main.  This confirmed occlusion of the LAD at its ostium.  There  were minimal collaterals from the distal LAD from the RCA.   Access was then obtained to the left groin using the modified  Seldinger  technique.  A 40 cm balloon pump was placed via this access and counter  pulsation initiated at 1:1.  The lesion of the proximal LAD was crossed  using a Luge wire with minimal difficulty.  A 2.5 x 15 over-wire Maverick  was advanced across the lesion.  Prior to predilation, a total of 1500 mcg  of intracoronary Adenosine was administered via the balloon lumen.  Luge  wire was reinserted.  With this balloon manipulation, reperfusion was  established.  It demonstrated the wire to be in a second diagonal branch.  Wire was then repositioned into the LAD proper.  The culprit lesion was  lengthy involving the entirety of the proximal LAD extending across the  ostium of the first and second diagonal branches.  The first diagonal branch  was large and had a 90% stenosis proximally.  I performed balloon inflation  of the proximal and mid LAD using the 2.5 mm Maverick for two inflations at  6 atmospheres.  I then attempted to perform aspiration thrombectomy using a  Diver catheter.  However, it would not traversed the guide.  It was  therefore removed.  A Whisper wire was then advanced into the first diagonal  branch.  Repeat balloon angioplasty of the mid LAD  was performed crossing  the diagonal.  Using the 2.5 mm Maverick.  I then used a 2.0 x 20 mm  Maverick to perform balloon angioplasty in the proximal portion of the  diagonal.  Balloon failed at 8 atmospheres to dilate fully proximally on  either of two successive inflations.  There remained a 90% stenosis with  questionable angioplasty-induced dissection.  Despite this, flow to the  distal vessel remained TIMI 3 as it did in the LAD.  Further balloon  angioplasty in the LAD was performed using the 2.5 mm Maverick at 8  atmospheres for two successive inflations again.  The diagonal remained  severely diseased.  Further balloon inflations in the ostium of the diagonal  were performed using 2.5 x 20 mm Maverick at 6 atmospheres.   These failed to  resolve the ostial stenosis.  I therefore introduced a 2.25 x 10 mm cutting  balloon and inflated it to 8 atmospheres in the diagonal ostium.  Despite  these maneuvers, the diagonal remained severely diseased.  I discussed the  situation with Carole Binning, M.D. [facility], who agreed that the diagonal was  large and important and an effort should be made to salvage it.  With no  response to conventional balloon angioplasty and cutting balloon  angioplasty, I felt that stenting of the diagonal and LAD using a crushed  stent technique would be optimal.  To further prepare the lesion, I used a  2.25 x 20 mm Maverick in the diagonal ostium for two successive inflations  of 6 atmospheres.  Again, the lesion remained 90% with TIMI 3 flow.  As  crush stenting could not be performed using the guide in place, the decision  was made to up-size to 8 [place].  Guide and wires were removed.   In the process of exchanging for a larger sheath, the exchange wire was  removed from the groin inadvertently.  This was recognized immediately and  manual compression applied with minimal blood loss and no development of a  hematoma.  While manual compression was applied, the intra-aortic balloon  pump was removed over a wire and replaced with a 9 French sheath in the left  common femoral artery.  There was no leak around the sheath.  A fem-stop  device was placed on the right common femoral artery site in a sterile bag.  Complete hemostasis was obtained.  This was maintained in the position  throughout the remainder of the case.  The patient suffered no hemodynamic  compromise from removal of the balloon pump.   An 8 [place] Voda Left 4.0 guide was advanced over a wire via the 9 French  sheath and engaged in the ostium of the left main.  Luge wires were advanced  into the LAD and diagonal.  I attempted to pass a 2.5 x 18 mm Cypher into the diagonal.  However, this would not pass a point of severe  stenosis in  the proximal diagonal.  In efforts to do so, the wire became dislodged.  The  Luge was reinserted into the LAD and a 300 mL Luge advanced into the  diagonal.  An over-the-wire balloon was advanced over this into the  diagonal.  Luge was removed and exchanged via the balloon lumen for a  Mailman wire.  The diagonal ostium was again treated using a 2.5 x 15 mm  Maverick at 12 atmospheres.  I again attempted to pass the Cypher into the  proximal diagonal over the [place], but was  unable to do so.  The decision  was thus made to abandon efforts to salvage the diagonal and concentrate  instead on the LAD.   The wire was removed from the diagonal.  A 2.5 x 28 mm Cypher was then  deployed in the mid-LAD at 16 atmospheres.  A 3.0 x 28 mm Cypher was then  positioned at the ostium of the LAD such that it overlapped the previously  placed stent at its distal margin.  This was deployed at 16 atmospheres.  The distal portion of the stented region was then post dilated using a 2.5 x  20 mm Quantum at 18 atmospheres.  The 2.5 mm Quantum was then pulled back to  the region of overlap of the stents and inflated to 20 atmospheres.  A 3.0 x  20 mm Maverick was then used to post dilate the proximal portion of the  stent at 20 atmospheres for four successive inflations including the region  of overlap.  Final angiography demonstrated TIMI 2 to 3 flow into the distal  LAD, no residual stenosis in the proximal and mid LAD, TIMI 3 flow in the  diagonal which had a residual 80 to 90% stenosis proximally.   Both groins remained without hematoma.  The patient remained hemodynamically  stable.  Angiomax was discontinued.  The patient was then transferred to the  cardiac intensive care unit in stable condition.   COMPLICATIONS:  None.   FINDINGS:  1.  LV:  EF 45% with anterolateral and anteroapical akinesis.  2.  No aortic stenosis or mitral regurgitation.  3.  Left ventricular pressure 127/13/27  before the initiation of balloon      counter pulsation.  4.  Left main:  Angiographically normal.  5.  LAD:  The LAD was occluded at its ostium with faint collaterals from the      right.  After drug-eluding stent placement, TIMI 2 to 3 flow was      restored to the distal LAD. There remains a 90% stenosis in the proximal      portion of a large first diagonal.  A second diagonal is jailed by      stents, but free of significant disease.  6.  Circumflex:  The circumflex is a large vessel giving rise to four obtuse      marginals.  The second marginal has a proximal 30% stenosis.  There is      no other significant disease in the circumflex territory.  7.  RCA:  Moderate size dominant vessel.  There is a 30% stenosis of the      proximal vessel and a 20% stenosis of the mid vessel.   IMPRESSION:  Successful revascularization of totally occluded LAD in the  setting of acute anterior myocardial infarction.  The patient has tolerated the procedure well and transferred to the CCU in stable condition.  Will  initiate beta blocker and ACE inhibitor immediately.  Plavix should be  continued for a minimum of one year and aspirin should be continued  indefinitely.  The patient will likely need nitrates for medical therapy of  the severe diagonal stenosis.       WED/MEDQ  D:  10/11/2004  T:  10/11/2004  Job:  [id number]

## 2011-05-08 NOTE — Consult Note (Signed)
NAME:  [name], [name] NO.:  [id number]   MEDICAL RECORD NO.:  [id number]                   PATIENT TYPE:  INP   LOCATION:  0347                                 FACILITY:  [facility]   PHYSICIAN:  Marolyn Hammock. Thad Ranger, M.D.           DATE OF BIRTH:  [birth year]   DATE OF CONSULTATION:  08/27/2004  DATE OF DISCHARGE:                                   CONSULTATION   CHIEF COMPLAINT:  Right sided numbness, suspected stroke.   HISTORY OF PRESENT ILLNESS:  This is the initial inpatient consultation  evaluation of this 65 year old male with a past medical history which  includes diabetes and obesity.  The patient reports that he awoke on the  morning of August 26, 2004 with a numb sensation involving the right side  of his face, moving down through his neck and into his arm, mostly in the  proximal arm, and down into the trunk but sparing the leg.  He also noticed  that had a little difficulty walking around and felt vaguely light headed.  He denies any associated visual change, dysarthria, dysphagia, or any  definite numbness.  He has not had any falls and no associated chest pain,  shortness of breath or headache.  The patient's wife also reported to the  admitting physician that he seemed somewhat slow to answer questions that  day which was unusual for him.  He was subsequently admitted for further  evaluation and work up and neurologic consultation is requested.  Today the  patient reports that he is doing better, he is having less difficulty  getting around.  He still has a numb sensation involving the right side  particularly of the face and upper extremity.  He denies ever having  symptoms like this before and there is no known previous history of stroke.   PAST MEDICAL HISTORY:  1.  Remarkable for the diabetes as above.  He says this is in decent control      with his sugars running in approximately the 140 to 150 range.  2.  Also remarkable for  obesity.  3.  A recent fall with a left knee injury.  4.  He says he does not really know what his cholesterol is.  5.  He denies having any history of heart problems.   FAMILY/SOCIAL/REVIEW OF SYSTEMS:  As outlined in the admission H&P of  August 27, 2004.   MEDICATIONS:  Prior to admission he was taking Metformin and Actos.  He had  also been given a prescription for pain medications and an antibiotic for  his skinned knee but says he was not taking these.  He denies taking any  vitamins or anything to thin the blood.   ALLERGIES:  No known drug allergies.   PHYSICAL EXAMINATION:  VITAL SIGNS:  Temperature 97.6, blood pressure  125/87, pulse 91, respirations 24, O2 saturation 96% on room air.  CBG 138  to 194.  GENERAL:  This is an obese but otherwise healthy appearing man seated in no  evident distress.  HEAD:  Cranium is normocephalic, atraumatic. Oropharynx is benign.  NECK:  Supple without carotid bruits.  HEART:  Regular rate and rhythm without murmurs.  NEUROLOGIC EXAM:  Mental status - he is awake, alert and oriented to time,  place and person.  Recent and remote memory are intact.  Attention span and  concentration, fund of knowledge are appropriate.  Speech is fluent and not  dysarthric. There are no defects to computational naming and he can repeat a  phrase.  Cranial nerves - funduscopic exam reveals mild proliferative  changes.  Pupils are equal and reactive.  Extraocular movements full without  nystagmus.  Visual fields are full to confrontation. Hearing is intact and  symmetric to finger rub.  Facial sensation is intact to pin prick.  Face,  tongue and palate move normally and symmetrically.  Shoulder shrug strength  is normal.  Motor testing - normal bulk and tone, normal strength in all  extremity muscles.  Sensation intact to light touch and pink prick  throughout.  Coordination - rapid movements performed well.  Finger-to-nose  is performed adequately.  Gait -  he arises from the bed easily and his  stance is normal.  He is able to toe and tandem walk without difficulty.  Reflexes 1+ throughout.  Toes are down going.   LABORATORY REVIEW:  CT of the head is personally reviewed.  I agree it  demonstrates no significant abnormality.  Admission CBC:  White count 6.4  thousand, hemoglobin 14.8, platelets 216,000 with a normal differential.  CMET on admission remarkable for a glucose of 154, and a minimally elevated  AST. Cardiac enzymes were negative.  Coags are normal.  Lipid profile done this morning:  total cholesterol 261, triglycerides 532,  HDL 41, LDL not calculated.  Hemoglobin A1C this morning 8.1.   Carotid Doppler study performed today reveals no significant carotid  disease.  The patient has refused MRI testing due to severe claustrophobia.   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  Risk factors include diabetes, and hypercholesterolemia.   PLAN:  Since an MRI cannot be obtained would suggest checking a CT angiogram  of the head.  A 2D echocardiogram will also be ordered along with fasting homocysteine  level.  He will require treatment with a statin for his hypercholesterolemia  and with aspirin but if all his testing looks good those will be the only  necessary recommendations.  He definitely needs close ongoing control of his  risk factors.   Thank you for the consultation.                                               Michael L. Thad Ranger, M.D.    MLR/MEDQ  D:  08/27/2004  T:  08/27/2004  Job:  [id number]   cc:   Meredith Staggers, M.D.  [address]  [place]  [place] [address]  Fax: [phone]

## 2011-05-08 NOTE — Discharge Summary (Signed)
NAME:  [name], [name] NO.:  [id number]   MEDICAL RECORD NO.:  [id number]          PATIENT TYPE:  INP   LOCATION:  2030                         FACILITY:  [facility]   PHYSICIAN:  Salvadore Farber, M.D. LHCDATE OF BIRTH:  [birth year]   DATE OF ADMISSION:  10/09/2004  DATE OF DISCHARGE:  10/12/2004                           DISCHARGE SUMMARY - REFERRING   PROCEDURES:  Emergent coronary angiography/Cypher stenting of proximal left  anterior descending on October 09, 2004.   REASON FOR ADMISSION:  [name] is a 65 year old male with no prior  cardiac history, but with multiple cardiac risk factors notable for type 2  diabetes mellitus, dyslipidemia and age, who presented with acute anterior  myocardial infarction.  Please refer to the dictated note for full details.   LABORATORY DATA:  Cardiac enzymes:  Peak CPK 3022/133; peak troponin I 89.  BNP 34.  Lipid profile:  Total cholesterol 188, triglycerides 174, HDL 40,  LDL 113.  Serial hemoglobin/hematocrit normal.  Normal white count and  platelet levels.  Normal electrolytes and renal function.  Elevated glucose  at 201 on admission with hemoglobin A1C 7.1.  Normal liver enzymes.   HOSPITAL COURSE:  The patient was taken urgently to the cardiac  catheterization laboratory where he underwent coronary angiography by  Salvadore Farber, M.D., (see report for full details) revealing severe  single-vessel coronary artery disease with total occlusion of the proximal  LAD and heavy calcification and involvement of a large first diagonal  branch.  Although Dr. Samule Ohm was able to proceed with cutting balloon PTCA  of the first diagonal, he was unable to deploy a stent in the first  diagonal.  He therefore proceeded with Cypher stenting of the proximal LAD,  jailing the first diagonal and resulting in only TIMI-2 flow to the distal  LAD.   There were no noted complications and the patient was transferred to the  unit in  hemodynamically stable condition.  Aggressive medical therapy was  recommended.  A followup EKG revealed greater than 70% ST resolution.   Post intervention echocardiogram revealed preserved LVF (50-55%) with apical  hypokinesis.  The patient's postoperative course continued to remain benign.  The patient was cleared for discharge on hospital day #3.   Regarding medication adjustments, Captopril was changed to Altace prior to  discharge, Toprol was up titrated and consideration was given to the  addition of Imdur for treatment of the tight diagonal lesion.  However,  the patient's blood pressure remained in the lower normal range (110  systolic) and Dr. Ladona Ridgel recommended holding off on this until further  instructions.   MEDICATIONS AT DISCHARGE:  1.  Plavix 75 mg daily.  2.  Coated aspirin 32 mg daily.  3.  Toprol XL 100 mg daily.  4.  Altace 10 mg daily.  5.  Toprol XL 100 mg daily.  6.  Altace 10 mg daily.  7.  Lipitor 80 mg daily.  8.  Metformin 1000 mg b.i.d.  9.  Actos 30 mg daily.  10. Nitrostat 0.4 mg p.r.n.   ACTIVITY:  The patient is to refrain from  any strenuous activity or driving  until seen by his physician.   FOLLOWUP:  The patient is instructed to follow up with Salvadore Farber,  M.D., in the next 10-14 days.  Arrangements will be made throughout our  office.   DISCHARGE DIAGNOSES:  1.  Status post acute anterior myocardial infarction.      1.  Emergent Cypher stenting of proximal left anterior descending on          October 09, 2004.      2.  Residual high-grade proximal first diagonal treated with cutting          balloon angioplasty.      3.  Preserved left ventricular function (ejection fraction 50-55% by          echocardiogram).  2.  Dyslipidemia.  3.  Type 2 diabetes mellitus.  4.  Status post recent lacunar infarction.       GS/MEDQ  D:  10/12/2004  T:  10/12/2004  Job:  [id number]   cc:   Meredith Staggers, M.D.  [address], [address]  [place]  [place] [address]  Fax: [phone]

## 2011-05-08 NOTE — Consult Note (Signed)
NAME:  [name], NIENHUIS NO.:  [id number]   MEDICAL RECORD NO.:  [id number]                   PATIENT TYPE:  REC   LOCATION:  FOOT                                 [facility]ACILITY[facility]   PHYSICIAN:  Jonelle Sports. Sevier, M.D.              DATE OF BIRTH:  06-Aug-1946   DATE OF CONSULTATION:  09/04/2004  DATE OF DISCHARGE:                                   CONSULTATION   HISTORY OF PRESENT ILLNESS:  This 65 year old white male seen at the  courtesy of Dr. Dayton Scrape for general foot evaluation and instruction in the  face of type 2 diabetes.  The patient's only symptoms referable to his feet  at this point are some end of the day discoloration and slight swelling in  some of the toes of the left lower extremity.  This apparently has caught  the patient's attention but has not been really a problem to him, and the  matter is always resolved by the following morning.   His diabetes has been only poorly controlled with an A1C of 8.46%, but he  reports that it is getting progressively  better.  He has had no other  recognized complications of the disease of which he is aware.  He has never  had foot ulceration or significant callous formation.  He is here today for  our evaluation advise.   PAST MEDICAL HISTORY:  The patient reports no other chronic diagnoses other  than just diabetes, but did have a minor stroke which has left him primarily  with sensory manifestations on his right side which occurred approximately  one week ago.   ALLERGIES:  He has no know medicinal allergies.   MEDICATIONS:  1.  Metformin ER 500 mg q.i.d.  2.  Actos 30 mg daily.  3.  Lipitor 10 mg daily.  4.  Aspirin 325 mg daily and some recent p.r.n.'s for fungus infection and      sore throat.   PHYSICAL EXAMINATION:  Examination today is limited to the distal lower  extremities.  The patient's feet are without gross deformity or free of  edema and really are grossly quite unremarkable.  Skin  temperatures are  normal and symmetrical throughout.  Pulses are everywhere palpable and  adequate.  There is good capillary filling in the toes.  Good __________ in  the toes.  __________ testing shows that he has protective sensation  throughout.  Specifically, there is no discoloration, tenderness, swelling  or other deformity noted at any of the toes of the left foot which is where  his symptoms were said to occur.  There is some dense callous formation on  the later aspects of heels and lesser callous formation on plantar aspect of  heels.  He also has minor calluses at the interphalangeal aspects of the  hallux bilaterally on the medial borders.   IMPRESSION:  Unexplained discoloration and swelling, both mild, of the toes  of the left foot, intermittent at the end of day, likely secondary to mild  venous insufficiency.   DISPOSITION:  1.  The patient was given instruction regarding foot care and diabetes by      video with nursing/physician reinforcement.  2.  The symptoms were discussed with the patient and it was recommended that      he consider the use of commercially available stretch hose that come      above the calf, and if this does not resolve the problem, he might      reconsult [place] regarding some prescription compression hose.  3.  The calluses at the patient's heels and interphalangeal areas of the      halluces bilaterally are __________ without incident.  4.  Follow up visit here will be on a p.r.n. basis.                                               Jonelle Sports. Cheryll Cockayne, M.D.    RES/MEDQ  D:  09/04/2004  T:  09/04/2004  Job:  [id number]   cc:   Meredith Staggers, M.D.  [address], [address]  [place]  [place] [address]  Fax: [phone]

## 2011-05-08 NOTE — Discharge Summary (Signed)
NAME:  [name], [name] NO.:  [id number]   MEDICAL RECORD NO.:  [id number]                   PATIENT TYPE:  INP   LOCATION:  0347                                 FACILITY:  [facility]   PHYSICIAN:  Sherin Quarry, MD                   DATE OF BIRTH:  [birth year]   DATE OF ADMISSION:  08/26/2004  DATE OF DISCHARGE:                                 DISCHARGE SUMMARY   [name] is a 65 year old man with a history of diabetes and obesity,  who presented on September 7 with onset early that morning of numbness  involving the right side of his face and right upper extremity associated  with a feeling of dizziness.  His wife had observed that the day before, he  seemed fatigued and was slow to answer questions, which was very unusual for  him.   PHYSICAL EXAMINATION ON ADMISSION:  GENERAL:  Physical exam at the time of  admission revealed an obese white male who was in no acute distress.  VITAL SIGNS:  Blood pressure was 129/85, pulse was 80, respirations 20, O2  saturation 97%.  Blood pressure was 142/87.  HEENT:  Within normal limits.  There is no facial asymmetry.  CHEST:  Clear.  CARDIOVASCULAR:  Normal S1 and S2.  There are no murmurs, rubs or gallops.  ABDOMEN:  Obese.  There were normal bowel sounds.  There was no masses,  tenderness, guarding or rebound.  NEUROLOGIC:  He was alert and oriented x 3.  Cranial nerves II-XII are  intact.  The motor system was entirely normal.  There was no upper extremity  drift.  Finger/nose/finger testing was normal.  Sensory exam appeared to be  grossly intact.   Laboratory studies obtained on [name] revealed a CBC that showed a  white count of 6400, hemoglobin 14.8.  The CMET showed a sodium of 138,  potassium 4.4.  The glucose was initially 154.  Creatinine was 0.9.  BUN was  12.  PT/PTT were normal.  A lipid profile showed a total cholesterol of 261.  Triglycerides were 532.  LDL could not be calculated.  HDL  was 41.  Hemoglobin A1C was 8.1.  Homocysteine level was 11.3, which is considered to  be in the normal range.   A CT scan of the head was performed on admission.  This was normal.  The  patient was admitted for monitoring and was placed on telemetry.  No  abnormalities were detected from telemetry monitoring.   Sliding-scale insulin regimen was initiated.   Consultation was obtained from Dr. Thad [name] of the urology service.  Dr.  Thad [name] recommended that MRI and MR angiography be done, but unfortunately  the patient felt that he could not tolerate this procedure due to  claustrophobia.  Therefore, a CT angiography study was done.  The verbal  report on this study indicates that his carotids and vertebrals looked  good.  It was suggested that he might have a 40-50% lesion of one carotid but no  signs of significant obstruction.  Intracranial blood vessels looked good.  There was evidence of a lacunar infarct involving the thalamus on the left.  In light of these findings, and as per Dr. Thad [name]' advice, the patient was  started on aspirin 1 tablet daily.  He was also placed on Lipitor 10 mg  daily.   It should be noted that he also had a carotid duplex Doppler study which  showed only minimal plaque.   On September 8th, the patient was discharged.  Just prior to discharge, an  echocardiogram was performed which showed normal left ventricular ejection  fraction with normal systolic function and no echocardiographic evidence for  a cardiac source of embolism.   DISCHARGE DIAGNOSES:  1.  Diabetes.  2.  Obesity.  3.  Lacunar stroke presenting as right-sided paresthesias.  4.  Hyperlipidemia with hypertriglyceridemia.   DISCHARGE MEDICATIONS:  1.  Aspirin 325 mg daily.  2.  Actos 30 mg daily.  3.  Glucophage 1 gm b.i.d.  4.  Lipitor 10 mg daily.   Patient was instructed to follow up on a regular basis with Dr. Dayton [name].  He  was particularly advised to monitor his blood sugars on  a regular basis and  inform Dr. Dayton [name] of these results and also to have a follow-up lipid  profile done in approximately six weeks.  He seems very committed to the  idea of achieving weight reduction and better regulating his diabetes.   Condition at the time of discharge was good.                                               Sherin Quarry, MD    SY/MEDQ  D:  08/28/2004  T:  08/28/2004  Job:  [id number]   cc:   [name], M.D.  [address]  [place]  [place] [address]  Fax: [phone]

## 2011-10-01 LAB — BASIC METABOLIC PANEL
BUN: 10
BUN: 12
BUN: 22
BUN: 27 — ABNORMAL HIGH
BUN: 14
BUN: 16
BUN: 20
BUN: 9
CO2: 26
CO2: 29
CO2: 30
CO2: 31
CO2: 22
CO2: 24
CO2: 25
CO2: 27
Calcium: 8.7
Calcium: 9.1
Calcium: 9.1
Calcium: 9.3
Calcium: 9.4
Calcium: 9.4
Calcium: 9.5
Calcium: 9.6
Chloride: 100
Chloride: 104
Chloride: 95 — ABNORMAL LOW
Chloride: 98
Chloride: 104
Chloride: 106
Chloride: 106
Chloride: 107
Creatinine, Ser: 0.96
Creatinine, Ser: 1.33
Creatinine, Ser: 1.38
Creatinine, Ser: 1.81 — ABNORMAL HIGH
Creatinine, Ser: 1.11
Creatinine, Ser: 1.16
Creatinine, Ser: 1.21
Creatinine, Ser: 1.23
GFR calc Af Amer: 47 — ABNORMAL LOW
GFR calc Af Amer: >60
GFR calc Af Amer: >60
GFR calc Af Amer: >60
GFR calc Af Amer: >60
GFR calc Af Amer: >60
GFR calc Af Amer: >60
GFR calc Af Amer: >60
GFR calc non Af Amer: 38 — ABNORMAL LOW
GFR calc non Af Amer: 53 — ABNORMAL LOW
GFR calc non Af Amer: 55 — ABNORMAL LOW
GFR calc non Af Amer: >60
GFR calc non Af Amer: >60
GFR calc non Af Amer: >60
GFR calc non Af Amer: >60
GFR calc non Af Amer: >60
Glucose, Bld: 106 — ABNORMAL HIGH
Glucose, Bld: 159 — ABNORMAL HIGH
Glucose, Bld: 160 — ABNORMAL HIGH
Glucose, Bld: 193 — ABNORMAL HIGH
Glucose, Bld: 133 — ABNORMAL HIGH
Glucose, Bld: 134 — ABNORMAL HIGH
Glucose, Bld: 202 — ABNORMAL HIGH
Glucose, Bld: 98
Potassium: 4.1
Potassium: 4.3
Potassium: 4.3
Potassium: 4.7
Potassium: 4.4
Potassium: 4.8
Potassium: 5.3 — ABNORMAL HIGH
Potassium: 6.3
Sodium: 132 — ABNORMAL LOW
Sodium: 133 — ABNORMAL LOW
Sodium: 136
Sodium: 138
Sodium: 135
Sodium: 137
Sodium: 139
Sodium: 140

## 2011-10-01 LAB — BLOOD GAS, ARTERIAL
Acid-base deficit: 1.3
Bicarbonate: 22.7
FIO2: 0.21
O2 Saturation: 95
Patient temperature: 98.6
TCO2: 23.9
pCO2 arterial: 37.1
pH, Arterial: 7.404
pO2, Arterial: 76.1 — ABNORMAL LOW

## 2011-10-01 LAB — URINALYSIS, ROUTINE W REFLEX MICROSCOPIC
Bilirubin Urine: NEGATIVE
Glucose, UA: NEGATIVE
Hgb urine dipstick: NEGATIVE
Ketones, ur: NEGATIVE
Nitrite: NEGATIVE
Protein, ur: NEGATIVE
Specific Gravity, Urine: 1.023
Urobilinogen, UA: 0.2
pH: 5

## 2011-10-01 LAB — HEMOGLOBIN A1C
Hgb A1c MFr Bld: 7.6 — ABNORMAL HIGH
Mean Plasma Glucose: 193

## 2011-10-01 LAB — TYPE AND SCREEN
ABO/RH(D): A NEG
Antibody Screen: NEGATIVE

## 2011-10-01 LAB — POCT I-STAT 3, ART BLOOD GAS (G3+)
Acid-Base Excess: 1
Acid-Base Excess: 3 — ABNORMAL HIGH
Acid-base deficit: 1
Acid-base deficit: 1
Acid-base deficit: 1
Bicarbonate: 23.2
Bicarbonate: 23.7
Bicarbonate: 24.2 — ABNORMAL HIGH
Bicarbonate: 27.1 — ABNORMAL HIGH
Bicarbonate: 27.4 — ABNORMAL HIGH
O2 Saturation: 100
O2 Saturation: 100
O2 Saturation: 100
O2 Saturation: 97
O2 Saturation: 97
Operator id: 193041
Operator id: 252761
Operator id: 3402
Operator id: 3402
Operator id: 3402
Patient temperature: 37.1
Patient temperature: 37.6
TCO2: 24
TCO2: 25
TCO2: 25
TCO2: 28
TCO2: 29
pCO2 arterial: 36.2
pCO2 arterial: 36.6
pCO2 arterial: 38.2
pCO2 arterial: 40.7
pCO2 arterial: 47.1 — ABNORMAL HIGH
pH, Arterial: 7.368
pH, Arterial: 7.385
pH, Arterial: 7.41
pH, Arterial: 7.425
pH, Arterial: 7.463 — ABNORMAL HIGH
pO2, Arterial: 177 — ABNORMAL HIGH
pO2, Arterial: 386 — ABNORMAL HIGH
pO2, Arterial: 433 — ABNORMAL HIGH
pO2, Arterial: 89
pO2, Arterial: 93

## 2011-10-01 LAB — COMPREHENSIVE METABOLIC PANEL
ALT: 35
AST: 41 — ABNORMAL HIGH
Albumin: 4.2
Alkaline Phosphatase: 57
BUN: 17
CO2: 24
Calcium: 9.6
Chloride: 108
Creatinine, Ser: 1.27
GFR calc Af Amer: >60
GFR calc non Af Amer: 58 — ABNORMAL LOW
Glucose, Bld: 158 — ABNORMAL HIGH
Potassium: 5.5 — ABNORMAL HIGH
Sodium: 141
Total Bilirubin: 1
Total Protein: 6.8

## 2011-10-01 LAB — POCT I-STAT 4, (NA,K, GLUC, HGB,HCT)
Glucose, Bld: 100 — ABNORMAL HIGH
Glucose, Bld: 109 — ABNORMAL HIGH
Glucose, Bld: 117 — ABNORMAL HIGH
Glucose, Bld: 145 — ABNORMAL HIGH
Glucose, Bld: 96
Glucose, Bld: 98
HCT: 25 — ABNORMAL LOW
HCT: 26 — ABNORMAL LOW
HCT: 27 — ABNORMAL LOW
HCT: 32 — ABNORMAL LOW
HCT: 33 — ABNORMAL LOW
HCT: 34 — ABNORMAL LOW
Hemoglobin: 10.9 — ABNORMAL LOW
Hemoglobin: 11.2 — ABNORMAL LOW
Hemoglobin: 11.6 — ABNORMAL LOW
Hemoglobin: 8.5 — ABNORMAL LOW
Hemoglobin: 8.8 — ABNORMAL LOW
Hemoglobin: 9.2 — ABNORMAL LOW
Operator id: 193041
Operator id: 3402
Operator id: 3402
Operator id: 3402
Operator id: 3402
Operator id: 3402
Potassium: 3.9
Potassium: 4.3
Potassium: 4.6
Potassium: 4.7
Potassium: 5.3 — ABNORMAL HIGH
Potassium: 5.4 — ABNORMAL HIGH
Sodium: 133 — ABNORMAL LOW
Sodium: 134 — ABNORMAL LOW
Sodium: 136
Sodium: 137
Sodium: 137
Sodium: 140

## 2011-10-01 LAB — CBC
HCT: 29.6 — ABNORMAL LOW
HCT: 30.4 — ABNORMAL LOW
HCT: 32.3 — ABNORMAL LOW
HCT: 33 — ABNORMAL LOW
HCT: 33.8 — ABNORMAL LOW
HCT: 41.2
HCT: 41.4
Hemoglobin: 10.1 — ABNORMAL LOW
Hemoglobin: 10.4 — ABNORMAL LOW
Hemoglobin: 11.2 — ABNORMAL LOW
Hemoglobin: 11.4 — ABNORMAL LOW
Hemoglobin: 11.6 — ABNORMAL LOW
Hemoglobin: 14
Hemoglobin: 14.2
MCHC: 34.3
MCHC: 34.3
MCHC: 34.3
MCHC: 34.4
MCHC: 34.6
MCHC: 34
MCHC: 34.3
MCV: 88.6
MCV: 88.7
MCV: 89.4
MCV: 89.6
MCV: 90
MCV: 89.3
MCV: 91.3
Platelets: 135 — ABNORMAL LOW
Platelets: 143 — ABNORMAL LOW
Platelets: 145 — ABNORMAL LOW
Platelets: 156
Platelets: 162
Platelets: 160
Platelets: 176
RBC: 3.29 — ABNORMAL LOW
RBC: 3.42 — ABNORMAL LOW
RBC: 3.65 — ABNORMAL LOW
RBC: 3.68 — ABNORMAL LOW
RBC: 3.79 — ABNORMAL LOW
RBC: 4.52
RBC: 4.64
RDW: 13.9
RDW: 14
RDW: 14.1 — ABNORMAL HIGH
RDW: 14.2 — ABNORMAL HIGH
RDW: 14.5 — ABNORMAL HIGH
RDW: 14.1 — ABNORMAL HIGH
RDW: 14.2 — ABNORMAL HIGH
WBC: 11.4 — ABNORMAL HIGH
WBC: 13.1 — ABNORMAL HIGH
WBC: 8.2
WBC: 9.3
WBC: 9.7
WBC: 5.8
WBC: 8.5

## 2011-10-01 LAB — HEMOGLOBIN AND HEMATOCRIT, BLOOD
HCT: 27.7 — ABNORMAL LOW
Hemoglobin: 9.7 — ABNORMAL LOW

## 2011-10-01 LAB — CREATININE, SERUM
Creatinine, Ser: 1.12
Creatinine, Ser: 1.34
GFR calc Af Amer: >60
GFR calc Af Amer: >60
GFR calc non Af Amer: 54 — ABNORMAL LOW
GFR calc non Af Amer: >60

## 2011-10-01 LAB — LIPID PANEL
Cholesterol: 120
HDL: 34 — ABNORMAL LOW
LDL Cholesterol: 51
Total CHOL/HDL Ratio: 3.5
Triglycerides: 173 — ABNORMAL HIGH
VLDL: 35

## 2011-10-01 LAB — APTT
aPTT: 36
aPTT: 29
aPTT: 29

## 2011-10-01 LAB — MAGNESIUM
Magnesium: 1.5
Magnesium: 1.7
Magnesium: 2

## 2011-10-01 LAB — HEPATIC FUNCTION PANEL
ALT: 146 — ABNORMAL HIGH
AST: 183 — ABNORMAL HIGH
Albumin: 3.7
Alkaline Phosphatase: 104
Bilirubin, Direct: <0.1
Total Bilirubin: 0.9
Total Protein: 6.2

## 2011-10-01 LAB — PLATELET COUNT: Platelets: 194

## 2011-10-01 LAB — I-STAT EC8
Acid-base deficit: 2
BUN: 13
Bicarbonate: 23.5
Chloride: 107
Glucose, Bld: 159 — ABNORMAL HIGH
HCT: 30 — ABNORMAL LOW
Hemoglobin: 10.2 — ABNORMAL LOW
Operator id: 252761
Potassium: 5
Sodium: 138
TCO2: 25
pCO2 arterial: 44.3
pH, Arterial: 7.332 — ABNORMAL LOW

## 2011-10-01 LAB — PROTIME-INR
INR: 1.4
INR: 0.9
INR: 1
Prothrombin Time: 17.4 — ABNORMAL HIGH
Prothrombin Time: 12.5
Prothrombin Time: 13.3

## 2011-10-01 LAB — POTASSIUM: Potassium: 6 — ABNORMAL HIGH

## 2011-10-01 LAB — ABO/RH: ABO/RH(D): A NEG

## 2011-10-01 LAB — B-NATRIURETIC PEPTIDE (CONVERTED LAB): Pro B Natriuretic peptide (BNP): 247 — ABNORMAL HIGH

## 2012-07-26 ENCOUNTER — Encounter: Payer: Self-pay | Admitting: *Deleted

## 2012-07-26 ENCOUNTER — Encounter: Payer: Self-pay | Admitting: Cardiovascular Disease

## 2012-07-27 ENCOUNTER — Ambulatory Visit: Payer: Self-pay | Admitting: Cardiovascular Disease

## 2018-01-10 ENCOUNTER — Other Ambulatory Visit: Payer: Self-pay

## 2018-01-10 DIAGNOSIS — L97519 Non-pressure chronic ulcer of other part of right foot with unspecified severity: Secondary | ICD-10-CM

## 2018-01-21 ENCOUNTER — Encounter: Payer: Self-pay | Admitting: Vascular Surgery

## 2018-02-16 ENCOUNTER — Encounter (HOSPITAL_COMMUNITY): Payer: Non-veteran care

## 2018-02-16 ENCOUNTER — Encounter: Payer: Non-veteran care | Admitting: Vascular Surgery

## 2018-03-01 ENCOUNTER — Other Ambulatory Visit: Payer: Self-pay

## 2018-03-01 ENCOUNTER — Encounter: Payer: Self-pay | Admitting: Vascular Surgery

## 2018-03-01 ENCOUNTER — Ambulatory Visit (INDEPENDENT_AMBULATORY_CARE_PROVIDER_SITE_OTHER): Payer: Non-veteran care | Admitting: Vascular Surgery

## 2018-03-01 ENCOUNTER — Other Ambulatory Visit: Payer: Self-pay | Admitting: *Deleted

## 2018-03-01 ENCOUNTER — Ambulatory Visit (INDEPENDENT_AMBULATORY_CARE_PROVIDER_SITE_OTHER)
Admission: RE | Admit: 2018-03-01 | Discharge: 2018-03-01 | Disposition: A | Discharge status: Home / Self Care | Payer: Non-veteran care | Source: Ambulatory Visit | Attending: Vascular Surgery | Admitting: Vascular Surgery

## 2018-03-01 ENCOUNTER — Ambulatory Visit (HOSPITAL_COMMUNITY)
Admission: RE | Admit: 2018-03-01 | Discharge: 2018-03-01 | Disposition: A | Discharge status: Home / Self Care | Payer: Non-veteran care | Source: Ambulatory Visit | Attending: Vascular Surgery | Admitting: Vascular Surgery

## 2018-03-01 ENCOUNTER — Encounter: Payer: Self-pay | Admitting: *Deleted

## 2018-03-01 VITALS — BP 120/70 | HR 82 | Temp 98.1°F | Resp 16 | Ht 68.0 in | Wt 224.0 lb

## 2018-03-01 DIAGNOSIS — I739 Peripheral vascular disease, unspecified: Secondary | ICD-10-CM | POA: Insufficient documentation

## 2018-03-01 DIAGNOSIS — L97519 Non-pressure chronic ulcer of other part of right foot with unspecified severity: Secondary | ICD-10-CM

## 2018-03-01 NOTE — Progress Notes (Signed)
[facility] [place]  Patient name: [name] MRN: [id number] DOB: Nov 18, 1946 Sex: male  REASON FOR CONSULT: Nonhealing ulceration tip of right great toe with peripheral vascular occlusive disease  HPI: [name] is a 72 y.o. male, who is here today for evaluation of nonhealing ulceration on the tip of his great toe on the right.  This is been present for several months and he has severe pain associated with this.  He does have a history of prior coronary artery bypass grafting and apparently was told in the past that he would require intervention in his lower extremities at some point.  He does not have any true claudication type symptoms.  He does have difficulty with walking.  Reports that he has pain in both hips and thighs that occurs immediately when he stands to walk and that he is unstable and needs to hold onto things as he walks through his house.  I explained that in all likelihood this is not related to arterial insufficiency.  He reports severe pain related to the toe ulceration  Past Medical History:  Diagnosis Date  . CAD, AUTOLOGOUS BYPASS GRAFT   . DIABETES, TYPE 2   . DYSPNEA   . HYPERLIPIDEMIA   . OBESITY   . OBSTRUCTIVE SLEEP APNEA   . Old myocardial infarction     History reviewed. No pertinent family history.  SOCIAL HISTORY: Social History   Socioeconomic History  . Marital status: Married    Spouse name: Not on file  . Number of children: Not on file  . Years of education: Not on file  . Highest education level: Not on file  Social Needs  . Financial resource strain: Not on file  . Food insecurity - worry: Not on file  . Food insecurity - inability: Not on file  . Transportation needs - medical: Not on file  . Transportation needs - non-medical: Not on file  Occupational History  . Not on file  Tobacco Use  . Smoking status: Former Smoker    Types: Pipe  . Smokeless tobacco:  Never Used  Substance and Sexual Activity  . Alcohol use: Not on file  . Drug use: Not on file  . Sexual activity: Not on file  Other Topics Concern  . Not on file  Social History Narrative  . Not on file    Allergies  Allergen Reactions  . Ace Inhibitors   . Lisinopril     Current Outpatient Medications  Medication Sig Dispense Refill  . ammonium lactate (LAC-HYDRIN) 12 % lotion Apply 1 application topically as needed for dry skin.    Marland Kitchen aspirin 81 MG chewable tablet Chew 81 mg by mouth daily.    Marland Kitchen atorvastatin (LIPITOR) 40 MG tablet Take 40 mg by mouth daily.    . Cholecalciferol 2000 units TABS Take by mouth daily.    . citalopram (CELEXA) 40 MG tablet Take 40 mg by mouth daily.    . clopidogrel (PLAVIX) 75 MG tablet Take 75 mg by mouth daily.    . fluorouracil (EFUDEX) 5 % cream Apply topically 2 (two) times daily.    . furosemide (LASIX) 40 MG tablet Take 40 mg by mouth daily.    Marland Kitchen glipiZIDE (GLUCOTROL) 10 MG tablet Take 10 mg by mouth daily before breakfast.    . HYDROPHILIC EX Apply topically.    . metFORMIN (GLUCOPHAGE) 850 MG tablet Take 850 mg by mouth every 8 (eight) hours.    Marland Kitchen  metoprolol tartrate (LOPRESSOR) 50 MG tablet Take 50 mg by mouth 2 (two) times daily.    . nitroGLYCERIN (NITROSTAT) 0.4 MG SL tablet Place 0.4 mg under the tongue every 5 (five) minutes as needed for chest pain.    . Semaglutide 0.25 or 0.5 MG/DOSE SOPN Inject into the skin. Once a week.    . traZODone (DESYREL) 50 MG tablet Take 50 mg by mouth at bedtime.     No current facility-administered medications for this visit.     REVIEW OF SYSTEMS:  [X]  denotes positive finding, [ ]  denotes negative finding Cardiac  Comments:  Chest pain or chest pressure:    Shortness of breath upon exertion:    Short of breath when lying flat:    Irregular heart rhythm:        Vascular    Pain in calf, thigh, or hip brought on by ambulation: x   Pain in feet at night that wakes you up from your sleep:  x    Blood clot in your veins:    Leg swelling:         Pulmonary    Oxygen at home:    Productive cough:     Wheezing:         Neurologic    Sudden weakness in arms or legs:  x   Sudden numbness in arms or legs:     Sudden onset of difficulty speaking or slurred speech:    Temporary loss of vision in one eye:     Problems with dizziness:         Gastrointestinal    Blood in stool:     Vomited blood:         Genitourinary    Burning when urinating:     Blood in urine:        Psychiatric    Major depression:         Hematologic    Bleeding problems:    Problems with blood clotting too easily:        Skin    Rashes or ulcers: x       Constitutional    Fever or chills:      PHYSICAL EXAM: Vitals:   03/01/18 1413  BP: 120/70  Pulse: 82  Resp: 16  Temp: 98.1 F (36.7 C)  TempSrc: Oral  SpO2: 94%  Weight: 224 lb (101.6 kg)  Height: 5\' 8"  (1.727 m)    GENERAL: The patient is a well-nourished male, in no acute distress. The vital signs are documented above. CARDIOVASCULAR: Palpable radial and femoral pulses.  Faint right popliteal and 1-2+ left popliteal pulse I do not palpate pedal pulses bilaterally PULMONARY: There is good air exchange  ABDOMEN: Soft and non-tender  MUSCULOSKELETAL: There are no major deformities or cyanosis. NEUROLOGIC: No focal weakness or paresthesias are detected. SKIN: Does have an open ulceration approximately 1 cm in diameter over the tip of his right great toe with some surrounding erythema.  No evidence of infection onto his foot PSYCHIATRIC: The patient has a normal affect.  DATA:  Noninvasive studies revealed triphasic waveforms to the right popliteal level with moderate phasic flow in the anterior tibial and posterior tibial and no flow noted in the peroneal artery.  He has triphasic waveforms at the pedal level on the left.  MEDICAL ISSUES: I had a long discussion with the patient and his wife present.  Again explained that I do  not feel that his leg symptoms are related  to arterial insufficiency.  He does have monophasic flow into his foot on the right and suspect this is was causing him to have rest pain and nonhealing ulceration.  I have recommended arteriography for further evaluation.  Explained that he may be a candidate for endovascular treatment to improve flow to his right foot and that he also may require open surgical bypass depending on the finding of this study.  We will schedule this at his earliest convenience [facility]   Larina Earthlyodd F. Zurii Hewes, MD [facility] Office Tel [phone] Pager [phone]

## 2018-03-07 ENCOUNTER — Ambulatory Visit (HOSPITAL_COMMUNITY)
Admission: RE | Admit: 2018-03-07 | Discharge: 2018-03-07 | Disposition: A | Discharge status: Home / Self Care | Payer: No Typology Code available for payment source | Source: Ambulatory Visit | Attending: Vascular Surgery | Admitting: Vascular Surgery

## 2018-03-07 ENCOUNTER — Ambulatory Visit (HOSPITAL_COMMUNITY)
Admission: RE | Disposition: A | Discharge status: Home / Self Care | Payer: Self-pay | Source: Ambulatory Visit | Attending: Vascular Surgery

## 2018-03-07 ENCOUNTER — Encounter (HOSPITAL_COMMUNITY): Payer: Self-pay | Admitting: Vascular Surgery

## 2018-03-07 ENCOUNTER — Telehealth: Payer: Self-pay | Admitting: Vascular Surgery

## 2018-03-07 DIAGNOSIS — E1151 Type 2 diabetes mellitus with diabetic peripheral angiopathy without gangrene: Secondary | ICD-10-CM | POA: Diagnosis not present

## 2018-03-07 DIAGNOSIS — Z951 Presence of aortocoronary bypass graft: Secondary | ICD-10-CM | POA: Insufficient documentation

## 2018-03-07 DIAGNOSIS — Z6833 Body mass index (BMI) 33.0-33.9, adult: Secondary | ICD-10-CM | POA: Insufficient documentation

## 2018-03-07 DIAGNOSIS — E11621 Type 2 diabetes mellitus with foot ulcer: Secondary | ICD-10-CM | POA: Insufficient documentation

## 2018-03-07 DIAGNOSIS — I252 Old myocardial infarction: Secondary | ICD-10-CM | POA: Diagnosis not present

## 2018-03-07 DIAGNOSIS — E785 Hyperlipidemia, unspecified: Secondary | ICD-10-CM | POA: Insufficient documentation

## 2018-03-07 DIAGNOSIS — L97519 Non-pressure chronic ulcer of other part of right foot with unspecified severity: Secondary | ICD-10-CM | POA: Diagnosis not present

## 2018-03-07 DIAGNOSIS — Z7984 Long term (current) use of oral hypoglycemic drugs: Secondary | ICD-10-CM | POA: Diagnosis not present

## 2018-03-07 DIAGNOSIS — I739 Peripheral vascular disease, unspecified: Secondary | ICD-10-CM | POA: Diagnosis present

## 2018-03-07 DIAGNOSIS — Z7982 Long term (current) use of aspirin: Secondary | ICD-10-CM | POA: Insufficient documentation

## 2018-03-07 DIAGNOSIS — E669 Obesity, unspecified: Secondary | ICD-10-CM | POA: Diagnosis not present

## 2018-03-07 DIAGNOSIS — I998 Other disorder of circulatory system: Secondary | ICD-10-CM | POA: Diagnosis not present

## 2018-03-07 DIAGNOSIS — I70235 Atherosclerosis of native arteries of right leg with ulceration of other part of foot: Secondary | ICD-10-CM | POA: Diagnosis not present

## 2018-03-07 DIAGNOSIS — I2510 Atherosclerotic heart disease of native coronary artery without angina pectoris: Secondary | ICD-10-CM | POA: Diagnosis not present

## 2018-03-07 DIAGNOSIS — G4733 Obstructive sleep apnea (adult) (pediatric): Secondary | ICD-10-CM | POA: Insufficient documentation

## 2018-03-07 DIAGNOSIS — Z7902 Long term (current) use of antithrombotics/antiplatelets: Secondary | ICD-10-CM | POA: Diagnosis not present

## 2018-03-07 HISTORY — PX: PERIPHERAL VASCULAR ATHERECTOMY: CATH118256

## 2018-03-07 HISTORY — PX: LOWER EXTREMITY ANGIOGRAPHY: CATH118251

## 2018-03-07 LAB — POCT I-STAT, CHEM 8
BUN: 29 mg/dL — ABNORMAL HIGH (ref 6–20)
Calcium, Ion: 1.23 mmol/L (ref 1.15–1.40)
Chloride: 96 mmol/L — ABNORMAL LOW (ref 101–111)
Creatinine, Ser: 1.1 mg/dL (ref 0.61–1.24)
Glucose, Bld: 278 mg/dL — ABNORMAL HIGH (ref 65–99)
HCT: 44 % (ref 39.0–52.0)
Hemoglobin: 15 g/dL (ref 13.0–17.0)
Potassium: 4.2 mmol/L (ref 3.5–5.1)
Sodium: 136 mmol/L (ref 135–145)
TCO2: 28 mmol/L (ref 22–32)

## 2018-03-07 LAB — POCT ACTIVATED CLOTTING TIME: Activated Clotting Time: 219 seconds

## 2018-03-07 SURGERY — LOWER EXTREMITY ANGIOGRAPHY
Anesthesia: LOCAL | Laterality: Right

## 2018-03-07 MED ORDER — HEPARIN SODIUM (PORCINE) 1000 UNIT/ML IJ SOLN
INTRAMUSCULAR | Status: AC
  Filled 2018-03-07: qty 1

## 2018-03-07 MED ORDER — MIDAZOLAM HCL 2 MG/2ML IJ SOLN
INTRAMUSCULAR | Status: DC | PRN
Start: 2018-03-07 — End: 2018-03-07
  Administered 2018-03-07: 1 mg via INTRAVENOUS
  Administered 2018-03-07: 2 mg via INTRAVENOUS

## 2018-03-07 MED ORDER — HEPARIN (PORCINE) IN NACL 2-0.9 UNIT/ML-% IJ SOLN
INTRAMUSCULAR | Status: AC
  Filled 2018-03-07: qty 1000

## 2018-03-07 MED ORDER — FENTANYL CITRATE (PF) 100 MCG/2ML IJ SOLN
INTRAMUSCULAR | Status: DC | PRN
  Administered 2018-03-07 (×2): 25 ug via INTRAVENOUS

## 2018-03-07 MED ORDER — VIPERSLIDE LUBRICANT OPTIME
TOPICAL | Status: DC | PRN
  Administered 2018-03-07: 09:00:00 via SURGICAL_CAVITY

## 2018-03-07 MED ORDER — SODIUM CHLORIDE 0.9 % IV SOLN: 250.0000 mL | INTRAVENOUS | Status: DC | PRN

## 2018-03-07 MED ORDER — LIDOCAINE HCL (PF) 1 % IJ SOLN
INTRAMUSCULAR | Status: DC | PRN
  Administered 2018-03-07: 17 mL

## 2018-03-07 MED ORDER — SODIUM CHLORIDE 0.9% FLUSH: 3.0000 mL | Freq: Two times a day (BID) | INTRAVENOUS | Status: DC

## 2018-03-07 MED ORDER — MIDAZOLAM HCL 2 MG/2ML IJ SOLN
INTRAMUSCULAR | Status: AC
  Filled 2018-03-07: qty 2

## 2018-03-07 MED ORDER — NITROGLYCERIN IN D5W 200-5 MCG/ML-% IV SOLN
INTRAVENOUS | Status: AC
  Filled 2018-03-07: qty 250

## 2018-03-07 MED ORDER — HEPARIN SODIUM (PORCINE) 1000 UNIT/ML IJ SOLN
INTRAMUSCULAR | Status: DC | PRN
  Administered 2018-03-07: 2000 [IU] via INTRAVENOUS
  Administered 2018-03-07: 12000 [IU] via INTRAVENOUS

## 2018-03-07 MED ORDER — LIDOCAINE HCL 1 % IJ SOLN
INTRAMUSCULAR | Status: AC
  Filled 2018-03-07: qty 40

## 2018-03-07 MED ORDER — VERAPAMIL HCL 2.5 MG/ML IV SOLN
INTRAVENOUS | Status: AC
  Filled 2018-03-07: qty 2

## 2018-03-07 MED ORDER — HEPARIN (PORCINE) IN NACL 2-0.9 UNIT/ML-% IJ SOLN
INTRAMUSCULAR | Status: AC | PRN
  Administered 2018-03-07: 1000 mL via INTRA_ARTERIAL

## 2018-03-07 MED ORDER — SODIUM CHLORIDE 0.9 % IV SOLN
INTRAVENOUS | Status: DC
  Administered 2018-03-07: 06:00:00 via INTRAVENOUS

## 2018-03-07 MED ORDER — HYDRALAZINE HCL 20 MG/ML IJ SOLN: 5.0000 mg | INTRAMUSCULAR | Status: DC | PRN

## 2018-03-07 MED ORDER — FENTANYL CITRATE (PF) 100 MCG/2ML IJ SOLN
INTRAMUSCULAR | Status: AC
  Filled 2018-03-07: qty 2

## 2018-03-07 MED ORDER — LABETALOL HCL 5 MG/ML IV SOLN: 10.0000 mg | INTRAVENOUS | Status: DC | PRN

## 2018-03-07 MED ORDER — OXYCODONE HCL 5 MG PO TABS: 5.0000 mg | ORAL_TABLET | ORAL | Status: DC | PRN

## 2018-03-07 MED ORDER — SODIUM CHLORIDE 0.9 % WEIGHT BASED INFUSION: 1.0000 mL/kg/h | INTRAVENOUS | Status: DC

## 2018-03-07 MED ORDER — MORPHINE SULFATE (PF) 10 MG/ML IV SOLN: 2.0000 mg | INTRAVENOUS | Status: DC | PRN

## 2018-03-07 MED ORDER — SODIUM CHLORIDE 0.9% FLUSH: 3.0000 mL | INTRAVENOUS | Status: DC | PRN

## 2018-03-07 MED ORDER — IODIXANOL 320 MG/ML IV SOLN
INTRAVENOUS | Status: DC | PRN
  Administered 2018-03-07: 170 mL via INTRA_ARTERIAL

## 2018-03-07 SURGICAL SUPPLY — 24 items
BAG SNAP BAND KOVER 36X36 (MISCELLANEOUS) ×3 IMPLANT
BALLN COYOTE OTW 2.5X220X150 (BALLOONS) ×3
BALLOON COYOTE OTW 2.5X220X150 (BALLOONS) ×2 IMPLANT
CATH OMNI FLUSH 5F 65CM (CATHETERS) ×3 IMPLANT
CATH QUICKCROSS .018X135CM (MICROCATHETER) ×6 IMPLANT
COVER DOME SNAP 22 D (MISCELLANEOUS) ×3 IMPLANT
COVER PRB 48X5XTLSCP FOLD TPE (BAG) ×2 IMPLANT
COVER PROBE 5X48 (BAG) ×1
CROWN STEALTH MICRO-30 1.25MM (CATHETERS) ×3 IMPLANT
DEVICE CLOSURE MYNXGRIP 6/7F (Vascular Products) ×3 IMPLANT
KIT ENCORE 26 ADVANTAGE (KITS) ×3 IMPLANT
KIT MICROPUNCTURE NIT STIFF (SHEATH) ×3 IMPLANT
KIT PV (KITS) ×3 IMPLANT
LUBRICANT VIPERSLIDE CORONARY (MISCELLANEOUS) ×3 IMPLANT
SHEATH AVANTI 11CM 5FR (SHEATH) ×3 IMPLANT
SHEATH AVANTI 11CM 6FR (SHEATH) ×3 IMPLANT
SHEATH HIGHFLEX ANSEL 6FRX55 (SHEATH) ×3 IMPLANT
SYR MEDRAD MARK V 150ML (SYRINGE) ×3 IMPLANT
TRANSDUCER W/STOPCOCK (MISCELLANEOUS) ×3 IMPLANT
TRAY PV CATH (CUSTOM PROCEDURE TRAY) ×3 IMPLANT
WIRE BENTSON .035X145CM (WIRE) ×3 IMPLANT
WIRE G V18X300CM (WIRE) ×6 IMPLANT
WIRE ROSEN-J .035X260CM (WIRE) ×3 IMPLANT
WIRE VIPER ADVANCE .017X335CM (WIRE) ×3 IMPLANT

## 2018-03-07 NOTE — Discharge Instructions (Signed)

## 2018-03-07 NOTE — Progress Notes (Signed)
Pharmacy paged to see pt in cath lab holding

## 2018-03-07 NOTE — H&P (Signed)
   History and Physical Update  The patient was interviewed and re-examined.  The patient's previous History and Physical has been reviewed and is unchanged from recent office visit with Dr. Arbie CookeyEarly. Plan for aortogram with bilateral runoff and possible intervention on right.  Gwendolen Hewlett C. Randie Heinzain, MD [facility] [place] Office: [phone] Pager: [phone]  03/07/2018, 7:26 AM

## 2018-03-07 NOTE — Telephone Encounter (Signed)
-----   Message from Sharee PimpleMarilyn K McChesney, RN sent at 03/07/2018 11:54 AM EDT ----- Regarding: correction 3-4 weeks   ----- Message ----- From: Maeola Harmanain, Brandon Christopher, MD Sent: 03/07/2018   9:37 AM To: [id number]  Karna ChristmasWilliam B Nestor [id number] 31-Oct-1946  03/07/2018 Pre-operative Diagnosis: critical right lower extremity ischemia  Surgeon:  Apolinar JunesBrandon C. Randie Heinzain, MD  Procedure Performed: 1.  US guided cannulation of left common femoral artery 2.  Aortogram with bilateral lower extremity runoff 3.  CSI atherectomy of right AT and PT with 1.25 micro 4.  Balloon angioplasty of right AT, TP trunk and PT with 2.865mm balloon 5.  Moderate sedation with fentanyl and versed for 112 minutes 6.  Percutaneous closure of left common femoral artery with mynx device  F/u with Dr. Arbie CookeyEarly in 3-4 weeks with RLE duplex and ABI

## 2018-03-07 NOTE — Op Note (Signed)
Patient name: [name] MRN: [id number] DOB: [birth year] Sex: male  03/07/2018 Pre-operative Diagnosis: critical right lower extremity ischemia Post-operative diagnosis:  Same Surgeon:  Apolinar Junes C. Randie Heinz, MD Procedure Performed: 1.  US guided cannulation of left common femoral artery 2.  Aortogram with bilateral lower extremity runoff 3.  CSI atherectomy of right AT and PT with 1.25 micro 4.  Balloon angioplasty of right AT, TP trunk and PT with 2.6mm balloon 5.  Moderate sedation with fentanyl and versed for 112 minutes 6.  Percutaneous closure of left common femoral artery with mynx device  Indications: 72 year old male with a history of right great toe ulceration.  He has known decreased ABI signals at the level of the ankle.  He is indicated for angiogram possible intervention on the right lower extremity.  Findings: Aortoiliac segments appear free of disease.  Bilateral SFAs are also free of disease.  Left tibial arteries are not fully visualized for evaluation but there appears to be good flow in the left foot.  On the right side the anterior tibial artery is 90% stenosis in the midsegment gives out the level of the dorsalis pedis.  The posterior tibial artery has approximately 70% stenosis.  After treatment of both of these arteries are 0% residual stenosis and no flow-limiting dissection.  The anterior tibial artery does give out at the ankle although there is collateralization.  The posterior tibial appears to give off a medial and lateral plantar vessels which with prolonged fluoroscopy appears to give flow to the right great toe.   Procedure:  The patient was identified in the holding area and taken to room 8.  The patient was then placed supine on the table and prepped and draped in the usual sterile fashion.  A time out was called.  Ultrasound was used to evaluate the left common femoral artery.  It was patent .  A digital ultrasound image was acquired.  A micropuncture needle  was used to access the left common femoral artery under ultrasound guidance.  An 018 wire was advanced without resistance and a micropuncture sheath was placed.  The 018 wire was removed and a benson wire was placed.  The micropuncture sheath was exchanged for a 5 french sheath.  An omniflush catheter was advanced over the wire to the level of L-1.  An abdominal angiogram was obtained followed by bilateral lower extremity runoff.  With the above findings we then went up and over the bifurcation with Bentson wire and Omni Flush catheter.  We put a quick cross catheter to the level of the knee over V 18 wire and perform further fluoroscopic images to evaluate the tibials.  We then exchanged for a Rosen wire and placed a long 6 French sheath into the SFA and the patient was heparinized with initially 12,000 followed by additional 3000 of heparin after ACT return to 19.  We then used the V 18 and quick cross catheter to traverse across the almost occluded stenosis in the anterior tibial artery.  We then performed CSI atherectomy with 1.25 micro and balloon angioplasty with 2.5 mm balloon.  We were unable to cross the DP and so this vessel did give out at the level of the ankle.  With this we elected to treat the posterior tibial.  We again used the V 18 quick cross catheter to cross the most occluded posterior tibial artery.  CSI atherectomy was performed with a 1.25 micro followed by balloon Angie plasty with 2.5 mm balloon.  Please angiogram demonstrated increased flow through the tibials and the posterior tibial gives off a medial and lateral plantar.  The peroneal does give out for the ankle.  Hopefully this will be an outflow to heal his ulceration on his great toe.  Patient tolerated procedure well without immediate complication.  Contrast 170 cc.    Khandi Kernes C. Randie Heinzain, MD [facility] [place] Office: [phone] Pager: [phone]

## 2018-03-07 NOTE — Telephone Encounter (Signed)
LVM for appt 4/12 x2 US starting at 10 am  Mld lttr to home address

## 2018-03-08 MED FILL — Lidocaine HCl Local Inj 1%: INTRAMUSCULAR | Qty: 20 | Status: AC

## 2018-03-08 MED FILL — Heparin Sodium (Porcine) 2 Unit/ML in Sodium Chloride 0.9%: INTRAMUSCULAR | Qty: 1000 | Status: AC

## 2018-03-22 ENCOUNTER — Other Ambulatory Visit: Payer: Self-pay

## 2018-03-22 DIAGNOSIS — L97519 Non-pressure chronic ulcer of other part of right foot with unspecified severity: Secondary | ICD-10-CM

## 2018-03-22 DIAGNOSIS — I739 Peripheral vascular disease, unspecified: Secondary | ICD-10-CM

## 2018-04-01 ENCOUNTER — Ambulatory Visit (INDEPENDENT_AMBULATORY_CARE_PROVIDER_SITE_OTHER)
Admission: RE | Admit: 2018-04-01 | Discharge: 2018-04-01 | Disposition: A | Discharge status: Home / Self Care | Payer: Non-veteran care | Source: Ambulatory Visit | Attending: Vascular Surgery | Admitting: Vascular Surgery

## 2018-04-01 ENCOUNTER — Ambulatory Visit (INDEPENDENT_AMBULATORY_CARE_PROVIDER_SITE_OTHER): Payer: No Typology Code available for payment source | Admitting: Vascular Surgery

## 2018-04-01 ENCOUNTER — Encounter: Payer: Self-pay | Admitting: Vascular Surgery

## 2018-04-01 ENCOUNTER — Ambulatory Visit (HOSPITAL_COMMUNITY)
Admission: RE | Admit: 2018-04-01 | Discharge: 2018-04-01 | Disposition: A | Discharge status: Home / Self Care | Payer: Non-veteran care | Source: Ambulatory Visit | Attending: Vascular Surgery | Admitting: Vascular Surgery

## 2018-04-01 ENCOUNTER — Other Ambulatory Visit: Payer: Self-pay

## 2018-04-01 VITALS — BP 124/61 | HR 79 | Temp 98.1°F | Resp 16 | Ht 68.0 in | Wt 229.0 lb

## 2018-04-01 DIAGNOSIS — L97519 Non-pressure chronic ulcer of other part of right foot with unspecified severity: Secondary | ICD-10-CM | POA: Insufficient documentation

## 2018-04-01 DIAGNOSIS — I739 Peripheral vascular disease, unspecified: Secondary | ICD-10-CM | POA: Insufficient documentation

## 2018-04-01 NOTE — Progress Notes (Signed)
Patient ID: [name], male   DOB: June 30, 1946, [age] y.o.   MRN: [id number]  Reason for Consult: PVD (3-4 wk f/u with RLE and ABI.  S/p US guided cannulation of common fem art, Aortogram.)   [facility]  Subjective:     HPI:  [name] is a 72 y.o. male 72 year old male follows up after right anterior tibial and posterior tibial atherectomy with balloon angioplasty of the same vessels.  This was done for right great toe 1 cm tip ulceration as well as third toe tip ulceration.  He is now having healing of these wounds.  [facility].  He remains on aspirin Plavix.  He is able to walk without limitation although he does have proximal weakness in his legs that he thinks is related to his back.  Past Medical History:  Diagnosis Date  . CAD, AUTOLOGOUS BYPASS GRAFT   . DIABETES, TYPE 2   . DYSPNEA   . HYPERLIPIDEMIA   . OBESITY   . OBSTRUCTIVE SLEEP APNEA   . Old myocardial infarction    No family history on file. Past Surgical History:  Procedure Laterality Date  . LOWER EXTREMITY ANGIOGRAPHY N/A 03/07/2018   Procedure: LOWER EXTREMITY ANGIOGRAPHY;  Surgeon: Maeola Harmanain, Donnalee Cellucci Christopher, MD;  [facility];  Service: Cardiovascular;  Laterality: N/A;  . PERIPHERAL VASCULAR ATHERECTOMY Right 03/07/2018   Procedure: PERIPHERAL VASCULAR ATHERECTOMY;  Surgeon: Maeola Harmanain, Maize Brittingham Christopher, MD;  [facility];  Service: Cardiovascular;  Laterality: Right;  anterioer tibial    Short Social History:  Social History   Tobacco Use  . Smoking status: Former Smoker    Types: Pipe  . Smokeless tobacco: Never Used  Substance Use Topics  . Alcohol use: Not on file    Allergies  Allergen Reactions  . Ace Inhibitors   . Lisinopril     Current Outpatient Medications  Medication Sig Dispense Refill  . ammonium lactate (LAC-HYDRIN) 12 % lotion Apply 1 application topically as needed for dry skin.     Marland Kitchen. aspirin 81 MG chewable tablet Chew 81 mg by mouth daily.    Marland Kitchen. atorvastatin (LIPITOR) 40 MG tablet Take 40 mg by mouth daily.    . Cholecalciferol 2000 units TABS Take by mouth daily.    . citalopram (CELEXA) 40 MG tablet Take 40 mg by mouth daily.    . clopidogrel (PLAVIX) 75 MG tablet Take 75 mg by mouth daily.    . fluorouracil (EFUDEX) 5 % cream Apply topically 2 (two) times daily.    . furosemide (LASIX) 40 MG tablet Take 40 mg by mouth daily.    Marland Kitchen. glipiZIDE (GLUCOTROL) 10 MG tablet Take 10 mg by mouth daily before breakfast.    . HYDROPHILIC EX Apply topically.    . metFORMIN (GLUCOPHAGE) 850 MG tablet Take 850 mg by mouth every 8 (eight) hours.    . metoprolol tartrate (LOPRESSOR) 50 MG tablet Take 50 mg by mouth 2 (two) times daily.    . nitroGLYCERIN (NITROSTAT) 0.4 MG SL tablet Place 0.4 mg under the tongue every 5 (five) minutes as needed for chest pain.    . Semaglutide 0.25 or 0.5 MG/DOSE SOPN Inject into the skin. Once a week.    . traZODone (DESYREL) 50 MG tablet Take 50 mg by mouth at bedtime.     No current facility-administered medications for this visit.     Review of  Systems  Constitutional:  Constitutional negative. HENT: HENT negative.  Eyes: Positive for visual disturbance.   Respiratory: Positive for shortness of breath.  Cardiovascular: Positive for dyspnea with exertion and leg swelling.  Skin: Positive for wound.  Neurological: Positive for focal weakness.  Hematologic: Hematologic/lymphatic negative.  Psychiatric: Psychiatric negative.        Objective:  Objective   Vitals:   04/01/18 1142  BP: 124/61  Pulse: 79  Resp: 16  Temp: 98.1 F (36.7 C)  TempSrc: Oral  SpO2: 95%  Weight: 229 lb (103.9 kg)  Height: 5\' 8"  (1.727 m)   Body mass index is 34.82 kg/m.  Physical Exam  Constitutional: He appears well-developed.  HENT:  Head: Normocephalic.  Eyes: Pupils are equal, round, and reactive to light.  Neck: Normal range of motion.    Cardiovascular: Normal rate.  Pulses:      Radial pulses are 2+ on the right side, and 2+ on the left side.       Femoral pulses are 2+ on the right side, and 2+ on the left side.      Popliteal pulses are 2+ on the right side, and 2+ on the left side.  Strong right AT/PT signals There is signal in the 1st web interspace  Pulmonary/Chest: Effort normal.  Abdominal: Soft.    Data: I have independently interpreted his ABIs to be noncompressible bilaterally with triphasic waveforms.  His right lower extremity duplex demonstrates triphasic waveforms throughout his posterior tibial artery all the way to the level of the ankle and the peroneal artery is occluded and this was known from angiogram.     Assessment/Plan:     72 year old male follows up after intervention on his right lower extremity posterior tibial and anterior tibial arteries for wounds.  These are now stable and on the way to healing.  He will continue his aspirin Plavix follow-up in 3 months with repeat studies.     Maeola Harman MD [facility]

## 2018-06-20 ENCOUNTER — Other Ambulatory Visit: Payer: Self-pay

## 2018-06-20 DIAGNOSIS — L97519 Non-pressure chronic ulcer of other part of right foot with unspecified severity: Secondary | ICD-10-CM

## 2018-06-20 DIAGNOSIS — I739 Peripheral vascular disease, unspecified: Secondary | ICD-10-CM

## 2018-06-21 ENCOUNTER — Encounter: Payer: Self-pay | Admitting: Family

## 2018-06-21 ENCOUNTER — Ambulatory Visit (INDEPENDENT_AMBULATORY_CARE_PROVIDER_SITE_OTHER)
Admission: RE | Admit: 2018-06-21 | Discharge: 2018-06-21 | Disposition: A | Discharge status: Home / Self Care | Payer: Medicare Other | Source: Ambulatory Visit | Attending: Family | Admitting: Family

## 2018-06-21 ENCOUNTER — Other Ambulatory Visit: Payer: Self-pay

## 2018-06-21 ENCOUNTER — Ambulatory Visit (INDEPENDENT_AMBULATORY_CARE_PROVIDER_SITE_OTHER): Payer: Medicare Other | Admitting: Family

## 2018-06-21 ENCOUNTER — Ambulatory Visit (HOSPITAL_COMMUNITY)
Admission: RE | Admit: 2018-06-21 | Discharge: 2018-06-21 | Disposition: A | Discharge status: Home / Self Care | Payer: Medicare Other | Source: Ambulatory Visit | Attending: Family | Admitting: Family

## 2018-06-21 VITALS — BP 135/79 | HR 96 | Temp 98.8°F | Resp 20 | Ht 68.0 in | Wt 228.0 lb

## 2018-06-21 DIAGNOSIS — Z9862 Peripheral vascular angioplasty status: Secondary | ICD-10-CM | POA: Diagnosis not present

## 2018-06-21 DIAGNOSIS — L97519 Non-pressure chronic ulcer of other part of right foot with unspecified severity: Secondary | ICD-10-CM | POA: Diagnosis not present

## 2018-06-21 DIAGNOSIS — I779 Disorder of arteries and arterioles, unspecified: Secondary | ICD-10-CM

## 2018-06-21 DIAGNOSIS — Z87891 Personal history of nicotine dependence: Secondary | ICD-10-CM

## 2018-06-21 DIAGNOSIS — I739 Peripheral vascular disease, unspecified: Secondary | ICD-10-CM

## 2018-06-21 NOTE — Patient Instructions (Signed)

## 2018-06-21 NOTE — Progress Notes (Signed)
[facility] [place]   CC: Follow up peripheral artery occlusive disease  History of Present Illness [name] is a 72 y.o. male who is s/p right anterior tibial and posterior tibial atherectomy with balloon angioplasty of the same vessels on 03-07-18 by Dr. Randie Heinzain.  This was done for right great toe 1 cm tip ulceration as well as third toe tip ulceration.  He has healed these wounds.  [facility].  He remains on aspirin Plavix.  He is able to walk without limitation although he does have proximal weakness in his legs that he thinks is related to his back.  Dr. Randie Heinzain last evaluated pt on 04-01-18. At that time ABIs were noncompressible bilaterally with triphasic waveforms.  His right lower extremity duplex demonstrateed triphasic waveforms throughout his posterior tibial artery all the way to the level of the ankle and the peroneal artery is occluded and this was known from angiogram.  The wounds were stable and on the way to healing.  He was to continue his aspirin Plavix follow-up in 3 months with repeat studies.  He states he had a stroke in 2005 as manifested by feeling disoriented and trouble walking, this was transient.   He denies tingling numbness or pain in his feet.   He admits to not walking much, has  No barriers to walking, states he sits at his computer while babysitting his grandson.  He has intermittent bilateral sciatic pain. His PCP left that practice, sees his new PCP end of August 2019. He has not had a back evaluation. He did see a chiropractor yesterday.   He uses a C-PAP for his OSA.   Diabetic: Yes, states his FBS is in the low 200's. Tobacco use: non smoker  Pt meds include: Statin :Yes Betablocker: Yes ASA: Yes, 325 mg daily since MI in 2005 Other anticoagulants/antiplatelets: Plavix  Past Medical History:  Diagnosis Date  . CAD, AUTOLOGOUS BYPASS GRAFT   . DIABETES, TYPE 2   . DYSPNEA   .  HYPERLIPIDEMIA   . OBESITY   . OBSTRUCTIVE SLEEP APNEA   . Old myocardial infarction     Social History Social History   Tobacco Use  . Smoking status: Former Smoker    Types: Pipe  . Smokeless tobacco: Never Used  Substance Use Topics  . Alcohol use: Not on file  . Drug use: Not on file    Family History No family history on file.  Past Surgical History:  Procedure Laterality Date  . LOWER EXTREMITY ANGIOGRAPHY N/A 03/07/2018   Procedure: LOWER EXTREMITY ANGIOGRAPHY;  Surgeon: Maeola Harmanain, Brandon Christopher, MD;  [facility];  Service: Cardiovascular;  Laterality: N/A;  . PERIPHERAL VASCULAR ATHERECTOMY Right 03/07/2018   Procedure: PERIPHERAL VASCULAR ATHERECTOMY;  Surgeon: Maeola Harmanain, Brandon Christopher, MD;  [facility];  Service: Cardiovascular;  Laterality: Right;  anterioer tibial    Allergies  Allergen Reactions  . Ace Inhibitors   . Lisinopril     Current Outpatient Medications  Medication Sig Dispense Refill  . ammonium lactate (LAC-HYDRIN) 12 % lotion Apply 1 application topically as needed for dry skin.    Marland Kitchen. aspirin 81 MG chewable tablet Chew 81 mg by mouth daily.    Marland Kitchen. atorvastatin (LIPITOR) 40 MG tablet Take 40 mg by mouth daily.    . Cholecalciferol 2000 units TABS Take by mouth daily.    . citalopram (CELEXA) 40 MG tablet Take  40 mg by mouth daily.    . clopidogrel (PLAVIX) 75 MG tablet Take 75 mg by mouth daily.    . fluorouracil (EFUDEX) 5 % cream Apply topically 2 (two) times daily.    . furosemide (LASIX) 40 MG tablet Take 40 mg by mouth daily.    Marland Kitchen glipiZIDE (GLUCOTROL) 10 MG tablet Take 10 mg by mouth daily before breakfast.    . HYDROPHILIC EX Apply topically.    . metFORMIN (GLUCOPHAGE) 850 MG tablet Take 850 mg by mouth every 8 (eight) hours.    . metoprolol tartrate (LOPRESSOR) 50 MG tablet Take 50 mg by mouth 2 (two) times daily.    . nitroGLYCERIN (NITROSTAT) 0.4 MG SL tablet Place 0.4 mg under the tongue every 5 (five)  minutes as needed for chest pain.    . Semaglutide 0.25 or 0.5 MG/DOSE SOPN Inject into the skin. Once a week.    . traZODone (DESYREL) 50 MG tablet Take 50 mg by mouth at bedtime.     No current facility-administered medications for this visit.     ROS: See HPI for pertinent positives and negatives.   Physical Examination  Vitals:   06/21/18 1156 06/21/18 1159  BP: (!) 126/56 135/79  Pulse: 96   Resp: 20   Temp: 98.8 F (37.1 C)   TempSrc: Oral   SpO2: 96%   Weight: 228 lb (103.4 kg)   Height: 5\' 8"  (1.727 m)    Body mass index is 34.67 kg/m.  General: A&O x 3, WDWN, obese male. Gait: normal HENT: No gross abnormalities.  Eyes: PERRLA. Pulmonary: Respirations are non labored, CTAB, good air movement Cardiac: regular rhythm, no detected murmur.         Carotid Bruits Right Left   Negative Negative   Radial pulses are 2+ palpable bilaterally   Adominal aortic pulse is not palpable                         VASCULAR EXAM: Extremities without ischemic changes, without Gangrene; without open wounds.                                                                                                          LE Pulses Right Left       FEMORAL  2+ palpable  2+ palpable        POPLITEAL  not palpable   not palpable       POSTERIOR TIBIAL  not palpable   not palpable        DORSALIS PEDIS      ANTERIOR TIBIAL not palpable  not palpable    Abdomen: soft, NT, no palpable masses. Skin: no rashes, no cellulitis, no ulcers noted. Musculoskeletal: no muscle wasting or atrophy.  Neurologic: A&O X 3; appropriate affect, Sensation is normal; MOTOR FUNCTION:  moving all extremities equally, motor strength 5/5 throughout. Speech is fluent/normal. CN 2-12 intact. Psychiatric: Thought content is normal, mood appropriate for clinical situation.     ASSESSMENT: [name] is a 72 y.o.  male who is s/p right anterior tibial and posterior tibial atherectomy with balloon  angioplasty of the same vessels on 03-07-18 by Dr. Randie Heinz.  This was done for right great toe 1 cm tip ulceration as well as third toe tip ulceration.  He has healed these wounds.   He states he will walk more and sit less.   His atherosclerotic risk factors include uncontrolled DM, CAD, obesity, sedentary lifestyle, and treated OSA.  Fortunately he has never used tobacco.   DATA  Bilateral LE Arterial Duplex (06-21-18): Right: no focal stenosis, possible tibial artery occlusive disease; tri and biphasic waveforms to distal PTA, monophasic distal peroneal with occlusion or near occlusion (known).  Left: No focal stenosis, possible tibial artery occlusive disease. Tri and biphasic waveforms to distal PTA, brisk monophasic distal peroneal.   ABI (Date: 06/21/2018):  R:   ABI: [place] (was [place] on 04-01-18),   PT: mono (was tri)  DP: mono (was tri)  TBI:  0.42 (was 0.40)  L:   ABI: [place]  (was [place]),   PT: mono (was tri)  DP: mono (was bi)  TBI: 0.26 (was 0.40)  Bilateral ABI are not reliable, vessels remain non compressible. Bilateral waveform morphology has declined from tri and biphasic to monophasic.  Stable right TBI, decline in left TBI.     PLAN:  Based on the patient's vascular studies and examination, pt will return to clinic in 3 months with ABI's and bilateral LE arterial duplex. I advised him to notify us if he develops concerns re the circulation in his feet or legs.   Walk at least a total of 30 minutes daily in a safe environment.    I discussed in depth with the patient the nature of atherosclerosis, and emphasized the importance of maximal medical management including strict control of blood pressure, blood glucose, and lipid levels, obtaining regular exercise, and continued cessation of smoking.  The patient is aware that without maximal medical management the underlying atherosclerotic disease process will progress, limiting the benefit of any interventions.  The patient  was given information about PAD including signs, symptoms, treatment, what symptoms should prompt the patient to seek immediate medical care, and risk reduction measures to take.  Charisse March, RN, MSN, FNP-C [facility] Phone: [phone]  Clinic MD: Sondra Barges  06/21/18 12:29 PM

## 2018-09-19 ENCOUNTER — Other Ambulatory Visit: Payer: Self-pay

## 2018-09-19 DIAGNOSIS — I739 Peripheral vascular disease, unspecified: Secondary | ICD-10-CM

## 2018-09-19 DIAGNOSIS — I779 Disorder of arteries and arterioles, unspecified: Secondary | ICD-10-CM

## 2018-09-23 ENCOUNTER — Ambulatory Visit: Payer: No Typology Code available for payment source | Admitting: Family

## 2018-09-23 ENCOUNTER — Encounter (HOSPITAL_COMMUNITY): Payer: No Typology Code available for payment source

## 2018-09-23 ENCOUNTER — Other Ambulatory Visit (HOSPITAL_COMMUNITY): Payer: No Typology Code available for payment source

## 2018-09-30 ENCOUNTER — Encounter (HOSPITAL_COMMUNITY): Payer: No Typology Code available for payment source

## 2018-09-30 ENCOUNTER — Ambulatory Visit: Payer: No Typology Code available for payment source | Admitting: Family

## 2018-11-25 ENCOUNTER — Encounter (HOSPITAL_COMMUNITY): Payer: No Typology Code available for payment source

## 2018-11-25 ENCOUNTER — Inpatient Hospital Stay (HOSPITAL_COMMUNITY): Admission: RE | Admit: 2018-11-25 | Payer: No Typology Code available for payment source | Source: Ambulatory Visit

## 2018-11-25 ENCOUNTER — Ambulatory Visit: Payer: No Typology Code available for payment source | Admitting: Family

## 2019-05-18 ENCOUNTER — Telehealth (HOSPITAL_COMMUNITY): Payer: Self-pay

## 2019-05-18 NOTE — Telephone Encounter (Signed)
The above patient or their representative was contacted and gave the following answers to these questions:         Do you have any of the following symptoms?  NO  Fever                    Cough                   Shortness of breath  Do  you have any of the following other symptoms?    muscle pain         vomiting,        diarrhea        rash         weakness        red eye        abdominal pain         bruising          bruising or bleeding              joint pain           severe headache    Have you been in contact with someone who was or has been sick in the past 2 weeks?  NO  Yes                 Unsure                         Unable to assess   Does the person that you were in contact with have any of the following symptoms?   Cough         shortness of breath           muscle pain         vomiting,            diarrhea            rash            weakness           fever            red eye           abdominal pain           bruising  or  bleeding                joint pain                severe headache               Have you  or someone you have been in contact with traveled internationally in th last month?  NO       If yes, which countries?   Have you  or someone you have been in contact with traveled outside [employer] in th last month?   NO      If yes, which state and city?   COMMENTS OR ACTION PLAN FOR THIS PATIENT:         

## 2019-05-19 ENCOUNTER — Encounter: Payer: Self-pay | Admitting: Vascular Surgery

## 2019-05-19 ENCOUNTER — Other Ambulatory Visit: Payer: Self-pay

## 2019-05-19 ENCOUNTER — Ambulatory Visit (INDEPENDENT_AMBULATORY_CARE_PROVIDER_SITE_OTHER): Payer: Medicare Other | Admitting: Family

## 2019-05-19 ENCOUNTER — Ambulatory Visit (HOSPITAL_COMMUNITY)
Admission: RE | Admit: 2019-05-19 | Discharge: 2019-05-19 | Disposition: A | Discharge status: Home / Self Care | Payer: Medicare Other | Source: Ambulatory Visit | Attending: Vascular Surgery | Admitting: Vascular Surgery

## 2019-05-19 ENCOUNTER — Other Ambulatory Visit: Payer: Self-pay | Admitting: Vascular Surgery

## 2019-05-19 ENCOUNTER — Encounter: Payer: Self-pay | Admitting: Family

## 2019-05-19 ENCOUNTER — Ambulatory Visit (INDEPENDENT_AMBULATORY_CARE_PROVIDER_SITE_OTHER)
Admission: RE | Admit: 2019-05-19 | Discharge: 2019-05-19 | Disposition: A | Discharge status: Home / Self Care | Payer: Medicare Other | Source: Ambulatory Visit | Attending: Vascular Surgery | Admitting: Vascular Surgery

## 2019-05-19 VITALS — BP 140/80 | HR 92 | Temp 98.5°F | Resp 18 | Ht 68.0 in | Wt 222.0 lb

## 2019-05-19 DIAGNOSIS — I739 Peripheral vascular disease, unspecified: Secondary | ICD-10-CM | POA: Insufficient documentation

## 2019-05-19 DIAGNOSIS — L97511 Non-pressure chronic ulcer of other part of right foot limited to breakdown of skin: Secondary | ICD-10-CM

## 2019-05-19 DIAGNOSIS — Z9862 Peripheral vascular angioplasty status: Secondary | ICD-10-CM | POA: Diagnosis not present

## 2019-05-19 DIAGNOSIS — I779 Disorder of arteries and arterioles, unspecified: Secondary | ICD-10-CM | POA: Insufficient documentation

## 2019-05-19 NOTE — Progress Notes (Signed)
[facility] [place]   CC: Follow up peripheral artery occlusive disease  History of Present Illness [name] is a 73 y.o. male who is s/p right anterior tibial and posterior tibial atherectomy with balloon angioplasty of the same vessels on 03-07-18 by Dr. Randie Heinz. This was done for right great toe 1 cm tip ulceration as well as third toe tip ulceration. He had healed these wounds.  [facility]. He remains on aspirin Plavix.   Dr. Randie Heinz evaluated pt on 04-01-18. At that time ABIs were noncompressible bilaterally with triphasic waveforms. His right lower extremity duplex demonstrateed triphasic waveforms throughout his posterior tibial artery all the way to the level of the ankle and the peroneal artery is occluded and this was known from angiogram. The wounds were stable and on the way to healing. He was to continue his aspirin Plavix follow-up in 3 months with repeat studies.  He states he had a stroke in 2005 as manifested by feeling disoriented and trouble walking, this was transient.   He reports pain in his right 2nd and 3rd toes in the last month (since the end of April 2020), worse with walking, denies open wounds, does not keep him awake at night; has improved in the last day or so. He denies any injury to his right foot. He denies any problems with his left foot.   He states that his blood pressure at home this morning was 123/75, states his average pressure in the last month is 122/60-75. He denies headache, chest pain, or dyspnea.   He admits to not walking much, has  No barriers to walking.  He had a back evaluation by a chiropractor in 2019, states his large abdomen has made his spine "go way out of alignment".   He uses a C-PAP for his OSA.    Diabetic: Yes Tobacco use: non-smoker  Pt meds include: Statin :Yes Betablocker: Yes ASA: Yes, 325 mg daily Other anticoagulants/antiplatelets: Plavix  Past Medical History:  Diagnosis Date  . CAD, AUTOLOGOUS BYPASS GRAFT   . DIABETES, TYPE 2   . DYSPNEA   . HYPERLIPIDEMIA   . OBESITY   . OBSTRUCTIVE SLEEP APNEA   . Old myocardial infarction     Social History Social History   Tobacco Use  . Smoking status: Former Smoker    Types: Pipe  . Smokeless tobacco: Never Used  Substance Use Topics  . Alcohol use: Never    Frequency: Never  . Drug use: Never    Family History Family History  Family history unknown: Yes    Past Surgical History:  Procedure Laterality Date  . LOWER EXTREMITY ANGIOGRAPHY N/A 03/07/2018   Procedure: LOWER EXTREMITY ANGIOGRAPHY;  Surgeon: Maeola Harman, MD;  Location: [facility] INVASIVE CV LAB;  Service: Cardiovascular;  Laterality: N/A;  . PERIPHERAL VASCULAR ATHERECTOMY Right 03/07/2018   Procedure: PERIPHERAL VASCULAR ATHERECTOMY;  Surgeon: Maeola Harman, MD;  Location: [facility] CV LAB;  Service: Cardiovascular;  Laterality: Right;  anterioer tibial    Allergies  Allergen Reactions  . Ace Inhibitors   . Lisinopril     Current Outpatient Medications  Medication Sig Dispense Refill  . ammonium lactate (LAC-HYDRIN) 12 % lotion Apply 1 application topically as needed for dry skin.    Marland Kitchen aspirin 81 MG chewable tablet Chew 81 mg by mouth daily.    Marland Kitchen atorvastatin (LIPITOR) 40 MG tablet Take 40 mg by mouth daily.    Marland Kitchen  Cholecalciferol 2000 units TABS Take by mouth daily.    . citalopram (CELEXA) 40 MG tablet Take 40 mg by mouth daily.    . clopidogrel (PLAVIX) 75 MG tablet Take 75 mg by mouth daily.    . fluorouracil (EFUDEX) 5 % cream Apply topically 2 (two) times daily.    . furosemide (LASIX) 40 MG tablet Take 40 mg by mouth daily.    Marland Kitchen glipiZIDE (GLUCOTROL) 10 MG tablet Take 10 mg by mouth daily before breakfast.    . HYDROPHILIC EX Apply topically.    . metFORMIN (GLUCOPHAGE) 850 MG tablet Take 850 mg by mouth every 8 (eight) hours.    . metoprolol tartrate (LOPRESSOR) 50  MG tablet Take 50 mg by mouth 2 (two) times daily.    . nitroGLYCERIN (NITROSTAT) 0.4 MG SL tablet Place 0.4 mg under the tongue every 5 (five) minutes as needed for chest pain.    . Semaglutide 0.25 or 0.5 MG/DOSE SOPN Inject into the skin. Once a week.    . traZODone (DESYREL) 50 MG tablet Take 50 mg by mouth at bedtime.     No current facility-administered medications for this visit.     ROS: See HPI for pertinent positives and negatives.   Physical Examination  Vitals:   05/19/19 1411  BP: 140/80  Pulse: 92  Resp: 18  Temp: 98.5 F (36.9 C)  TempSrc: Temporal  SpO2: 94%  Weight: 222 lb (100.7 kg)  Height:  (1.727 m)   Body mass index is 33.75 kg/m.  General: A&O x 3, WD obese male. Gait: limp HENT: No gross abnormalities. Large neck Eyes: PERRLA. Pulmonary: Respirations are non labored, CTAB, good air movement in all fields Cardiac: regular rhythm, no detected murmur.         Carotid Bruits Right Left   Negative Negative   Radial and ulnar pulses are not palpable bilaterally   Adominal aortic pulse is not palpable                         VASCULAR EXAM: Extremities with ischemic changes: erythema in all right toes with dry ulcer at tip of right 3rd toe without Gangrene; without open wounds.     Right toes, dry ulcer at tip of 3rd toe    Mild erythema a all right toes                                                                                                             LE Pulses Right Left       FEMORAL  2+ palpable  2+ palpable        POPLITEAL  not palpable   not palpable       POSTERIOR TIBIAL  not palpable   not palpable        DORSALIS PEDIS      ANTERIOR TIBIAL not palpable  not palpable    Abdomen: softly obese, large panus, NT, no palpable masses. Skin: no rashes, see Extremities Musculoskeletal: no muscle  wasting or atrophy.  Neurologic: A&O X 3; appropriate affect, Sensation is normal except moderately painful to touch at  right toes; MOTOR FUNCTION:  moving all extremities equally, motor strength 5/5 throughout. Speech is fluent/normal. CN 2-12 intact. Psychiatric: Thought content is normal, mood appropriate for clinical situation.    ASSESSMENT: [name] is a 73 y.o. male who is s/p right anterior tibial and posterior tibial atherectomy with balloon angioplasty of the same vessels on 03-07-18 by Dr. Randie Heinz. This was done for right great toe 1 cm tip ulceration as well as third toe tip ulceration. He has healed these wounds.   Right 2nd and 3rd toes are painful in the last month, dry skin toned ulcer at tip of right 3rd toe. Bilateral TBI's are 0, were 0.42 on the right and 0.26 on the left in July 2019.  Artrial duplex of legs: right with triphasic waveforms to the level of popliteal, monophasic distal to this level.  Left with triphasic waveforms to the distal PTA, monophasic peroneal.  Will schedule arteriogram, see Plan.   His atherosclerotic risk factors include DM, CAD, obesity, sedentary lifestyle, and treated OSA.  Fortunately he has never used tobacco.   Unable to obtain reliable blood pressure readings: in ultrasound lab today, bilateral brachial pressures were >255. By Doppler when I tried: left brachial was 120 systolic, right was >255. By manual cuff and auscultation when I tried: Unable to detect in the left brachial, >255 in the right brachial. Pt checks his blood pressure at home, and states the average in the last month was 122/60-75.  He denies any headache, dizziness, chest pain, or dyspnea.  I advised him to recheck his blood pressure when he gets home, and to speak with whomever is on call for his PCP this evening re his blood pressure.     DATA  Bilateral LE Arterial Duplex (05-19-19): +-----------+--------+-----+--------+----------+--------+ RIGHT      PSV cm/sRatioStenosisWaveform  Comments +-----------+--------+-----+--------+----------+--------+ CFA Distal 75                    biphasic           +-----------+--------+-----+--------+----------+--------+ DFA        64                   biphasic           +-----------+--------+-----+--------+----------+--------+ SFA Prox   76                   triphasic          +-----------+--------+-----+--------+----------+--------+ SFA Mid    56                   triphasic          +-----------+--------+-----+--------+----------+--------+ SFA Distal 73                   triphasic          +-----------+--------+-----+--------+----------+--------+ POP Prox   88                   triphasic          +-----------+--------+-----+--------+----------+--------+ POP Distal 58                   triphasic          +-----------+--------+-----+--------+----------+--------+ ATA Distal 61                   monophasic         +-----------+--------+-----+--------+----------+--------+  PTA Distal 90                   monophasic         +-----------+--------+-----+--------+----------+--------+ PERO Distal28                   monophasic         +-----------+--------+-----+--------+----------+--------+   +-----------+--------+-----+--------+----------+--------+ LEFT       PSV cm/sRatioStenosisWaveform  Comments +-----------+--------+-----+--------+----------+--------+ CFA Distal 74                   triphasic          +-----------+--------+-----+--------+----------+--------+ DFA        51                   biphasic           +-----------+--------+-----+--------+----------+--------+ SFA Prox   97                   triphasic          +-----------+--------+-----+--------+----------+--------+ SFA Mid    74                   triphasic          +-----------+--------+-----+--------+----------+--------+ SFA Distal 66                   triphasic          +-----------+--------+-----+--------+----------+--------+ POP Prox   62                    triphasic          +-----------+--------+-----+--------+----------+--------+ POP Distal 67                   triphasic          +-----------+--------+-----+--------+----------+--------+ ATA Distal 107                  triphasic          +-----------+--------+-----+--------+----------+--------+ PTA Distal 91                   triphasic          +-----------+--------+-----+--------+----------+--------+ PERO Distal                     monophasic         +-----------+--------+-----+--------+----------+--------+   Summary: Right: No stenosis noted. Diffuse calcific plaque throughout. Probable tibial vessel disease. Left: No stenosis noted. Diffuse calcific plaque throughout. Probable tibial vessel disease.    ABI (Date: 05/19/2019): ABI Findings: +---------+------------------+-----+----------+--------+ Right    Rt Pressure (mmHg)IndexWaveform  Comment  +---------+------------------+-----+----------+--------+ Brachial 255                                       +---------+------------------+-----+----------+--------+ ATA      255               1.00 monophasic         +---------+------------------+-----+----------+--------+ PTA      201               0.79 monophasic         +---------+------------------+-----+----------+--------+ Great Toe0                 0.00 absent             +---------+------------------+-----+----------+--------+  +---------+------------------+-----+----------+-------+ Left  Lt Pressure (mmHg)IndexWaveform  Comment +---------+------------------+-----+----------+-------+ Brachial 255                                      +---------+------------------+-----+----------+-------+ ATA      255               1.00 biphasic          +---------+------------------+-----+----------+-------+ PTA      195               0.76 monophasic         +---------+------------------+-----+----------+-------+ Great Toe0                 0.00 absent            +---------+------------------+-----+----------+-------+  +-------+-----------+-----------+------------+------------+ ABI/TBIToday's ABIToday's TBIPrevious ABIPrevious TBI +-------+-----------+-----------+------------+------------+ Right  [place]         0          [place]          0.42         +-------+-----------+-----------+------------+------------+ Left   [place]         0                    0.26         +-------+-----------+-----------+------------+------------+   Previous ABI on 06/21/18. Please note bilateral brachial pressures. Systolic brachial pressures on previous study were Right: Left:   Summary: Right: Resting right ankle-brachial index indicates noncompressible right lower extremity arteries.The right toe-brachial index is abnormal. RT great toe pressure = 0 mmHg. Left: Resting left ankle-brachial index indicates noncompressible left lower extremity arteries.The left toe-brachial index is abnormal. TBIs are unreliable.     PLAN:  Based on the patient's vascular studies and examination, and after Dr. Randie Heinz spoke with and examined pt, pt will be scheduled for arteriogram by Dr. Randie Heinz on 6-8 or 06-05-19, with run off into lower extremities, possible intervention for right LE.   I discussed in depth with the patient the nature of atherosclerosis, and emphasized the importance of maximal medical management including strict control of blood pressure, blood glucose, and lipid levels, obtaining regular exercise, and continued cessation of smoking.  The patient is aware that without maximal medical management the underlying atherosclerotic disease process will progress, limiting the benefit of any interventions.  The patient was given information about PAD including signs, symptms, treatment, what symptoms should prompt the patient to seek immediate  medical care, and risk reduction measures to take.  Charisse March, RN, MSN, FNP-C [facility] Phone: [phone]  Clinic MD: Randie Heinz  05/19/19 2:30 PM

## 2019-05-26 ENCOUNTER — Other Ambulatory Visit (HOSPITAL_COMMUNITY)
Admission: RE | Admit: 2019-05-26 | Discharge: 2019-05-26 | Disposition: A | Discharge status: Home / Self Care | Payer: Medicare Other | Source: Ambulatory Visit | Attending: Vascular Surgery | Admitting: Vascular Surgery

## 2019-05-26 ENCOUNTER — Other Ambulatory Visit: Payer: Self-pay

## 2019-05-26 DIAGNOSIS — Z01812 Encounter for preprocedural laboratory examination: Secondary | ICD-10-CM | POA: Diagnosis not present

## 2019-05-26 DIAGNOSIS — Z1159 Encounter for screening for other viral diseases: Secondary | ICD-10-CM | POA: Diagnosis not present

## 2019-05-26 LAB — SARS CORONAVIRUS 2 BY RT PCR (HOSPITAL ORDER, PERFORMED IN ~~LOC~~ HOSPITAL LAB): SARS Coronavirus 2: NEGATIVE

## 2019-05-29 ENCOUNTER — Ambulatory Visit (HOSPITAL_COMMUNITY)
Admission: RE | Disposition: A | Discharge status: Home / Self Care | Payer: Self-pay | Source: Home / Self Care | Attending: Vascular Surgery

## 2019-05-29 ENCOUNTER — Ambulatory Visit (HOSPITAL_COMMUNITY)
Admission: RE | Admit: 2019-05-29 | Discharge: 2019-05-29 | Disposition: A | Discharge status: Home / Self Care | Payer: No Typology Code available for payment source | Attending: Vascular Surgery | Admitting: Vascular Surgery

## 2019-05-29 ENCOUNTER — Encounter (HOSPITAL_COMMUNITY): Payer: Self-pay | Admitting: Vascular Surgery

## 2019-05-29 ENCOUNTER — Other Ambulatory Visit: Payer: Self-pay

## 2019-05-29 DIAGNOSIS — E669 Obesity, unspecified: Secondary | ICD-10-CM | POA: Diagnosis not present

## 2019-05-29 DIAGNOSIS — Z888 Allergy status to other drugs, medicaments and biological substances status: Secondary | ICD-10-CM | POA: Insufficient documentation

## 2019-05-29 DIAGNOSIS — Z7902 Long term (current) use of antithrombotics/antiplatelets: Secondary | ICD-10-CM | POA: Diagnosis not present

## 2019-05-29 DIAGNOSIS — L97511 Non-pressure chronic ulcer of other part of right foot limited to breakdown of skin: Secondary | ICD-10-CM | POA: Insufficient documentation

## 2019-05-29 DIAGNOSIS — G4733 Obstructive sleep apnea (adult) (pediatric): Secondary | ICD-10-CM | POA: Diagnosis not present

## 2019-05-29 DIAGNOSIS — Z7984 Long term (current) use of oral hypoglycemic drugs: Secondary | ICD-10-CM | POA: Diagnosis not present

## 2019-05-29 DIAGNOSIS — Z79899 Other long term (current) drug therapy: Secondary | ICD-10-CM | POA: Diagnosis not present

## 2019-05-29 DIAGNOSIS — I252 Old myocardial infarction: Secondary | ICD-10-CM | POA: Diagnosis not present

## 2019-05-29 DIAGNOSIS — Z6833 Body mass index (BMI) 33.0-33.9, adult: Secondary | ICD-10-CM | POA: Diagnosis not present

## 2019-05-29 DIAGNOSIS — Z951 Presence of aortocoronary bypass graft: Secondary | ICD-10-CM | POA: Diagnosis not present

## 2019-05-29 DIAGNOSIS — E11621 Type 2 diabetes mellitus with foot ulcer: Secondary | ICD-10-CM | POA: Insufficient documentation

## 2019-05-29 DIAGNOSIS — I70235 Atherosclerosis of native arteries of right leg with ulceration of other part of foot: Secondary | ICD-10-CM | POA: Insufficient documentation

## 2019-05-29 DIAGNOSIS — Z7982 Long term (current) use of aspirin: Secondary | ICD-10-CM | POA: Insufficient documentation

## 2019-05-29 DIAGNOSIS — E785 Hyperlipidemia, unspecified: Secondary | ICD-10-CM | POA: Insufficient documentation

## 2019-05-29 DIAGNOSIS — E119 Type 2 diabetes mellitus without complications: Secondary | ICD-10-CM | POA: Insufficient documentation

## 2019-05-29 HISTORY — PX: LOWER EXTREMITY ANGIOGRAPHY: CATH118251

## 2019-05-29 LAB — POCT I-STAT, CHEM 8
BUN: 19 mg/dL (ref 8–23)
Calcium, Ion: 1.1 mmol/L — ABNORMAL LOW (ref 1.15–1.40)
Chloride: 99 mmol/L (ref 98–111)
Creatinine, Ser: 1.1 mg/dL (ref 0.61–1.24)
Glucose, Bld: 303 mg/dL — ABNORMAL HIGH (ref 70–99)
HCT: 45 % (ref 39.0–52.0)
Hemoglobin: 15.3 g/dL (ref 13.0–17.0)
Potassium: 4.7 mmol/L (ref 3.5–5.1)
Sodium: 137 mmol/L (ref 135–145)
TCO2: 29 mmol/L (ref 22–32)

## 2019-05-29 LAB — GLUCOSE, CAPILLARY
Glucose-Capillary: 282 mg/dL — ABNORMAL HIGH (ref 70–99)
Glucose-Capillary: 235 mg/dL — ABNORMAL HIGH (ref 70–99)

## 2019-05-29 SURGERY — LOWER EXTREMITY ANGIOGRAPHY
Anesthesia: LOCAL | Laterality: Bilateral

## 2019-05-29 MED ORDER — FENTANYL CITRATE (PF) 100 MCG/2ML IJ SOLN
INTRAMUSCULAR | Status: AC
  Filled 2019-05-29: qty 2

## 2019-05-29 MED ORDER — HEPARIN (PORCINE) IN NACL 1000-0.9 UT/500ML-% IV SOLN
INTRAVENOUS | Status: DC | PRN
  Administered 2019-05-29 (×2): 500 mL

## 2019-05-29 MED ORDER — HEPARIN (PORCINE) IN NACL 1000-0.9 UT/500ML-% IV SOLN
INTRAVENOUS | Status: AC
  Filled 2019-05-29: qty 1000

## 2019-05-29 MED ORDER — SODIUM CHLORIDE 0.9 % IV SOLN: 250.0000 mL | INTRAVENOUS | Status: DC | PRN

## 2019-05-29 MED ORDER — SODIUM CHLORIDE 0.9 % IV SOLN
INTRAVENOUS | Status: DC
  Administered 2019-05-29: 08:00:00 via INTRAVENOUS

## 2019-05-29 MED ORDER — SODIUM CHLORIDE 0.9% FLUSH: 3.0000 mL | INTRAVENOUS | Status: DC | PRN

## 2019-05-29 MED ORDER — LIDOCAINE HCL (PF) 1 % IJ SOLN
INTRAMUSCULAR | Status: AC
  Filled 2019-05-29: qty 30

## 2019-05-29 MED ORDER — HYDRALAZINE HCL 20 MG/ML IJ SOLN: 5.0000 mg | INTRAMUSCULAR | Status: DC | PRN

## 2019-05-29 MED ORDER — OXYCODONE HCL 5 MG PO TABS: 5.0000 mg | ORAL_TABLET | ORAL | Status: DC | PRN

## 2019-05-29 MED ORDER — FENTANYL CITRATE (PF) 100 MCG/2ML IJ SOLN
INTRAMUSCULAR | Status: DC | PRN
  Administered 2019-05-29 (×2): 25 ug via INTRAVENOUS

## 2019-05-29 MED ORDER — ONDANSETRON HCL 4 MG/2ML IJ SOLN: 4.0000 mg | Freq: Four times a day (QID) | INTRAMUSCULAR | Status: DC | PRN

## 2019-05-29 MED ORDER — SODIUM CHLORIDE 0.9% FLUSH: 3.0000 mL | Freq: Two times a day (BID) | INTRAVENOUS | Status: DC

## 2019-05-29 MED ORDER — MORPHINE SULFATE (PF) 10 MG/ML IV SOLN: 2.0000 mg | INTRAVENOUS | Status: DC | PRN

## 2019-05-29 MED ORDER — SODIUM CHLORIDE 0.9 % IV SOLN: INTRAVENOUS | Status: DC

## 2019-05-29 MED ORDER — ACETAMINOPHEN 325 MG PO TABS: 650.0000 mg | ORAL_TABLET | ORAL | Status: DC | PRN

## 2019-05-29 MED ORDER — LABETALOL HCL 5 MG/ML IV SOLN: 10.0000 mg | INTRAVENOUS | Status: DC | PRN

## 2019-05-29 MED ORDER — MIDAZOLAM HCL 2 MG/2ML IJ SOLN
INTRAMUSCULAR | Status: AC
  Filled 2019-05-29: qty 2

## 2019-05-29 MED ORDER — LIDOCAINE HCL (PF) 1 % IJ SOLN
INTRAMUSCULAR | Status: DC | PRN
  Administered 2019-05-29: 15 mL via INTRADERMAL

## 2019-05-29 MED ORDER — IODIXANOL 320 MG/ML IV SOLN
INTRAVENOUS | Status: DC | PRN
  Administered 2019-05-29: 125 mL via INTRA_ARTERIAL

## 2019-05-29 MED ORDER — MIDAZOLAM HCL 2 MG/2ML IJ SOLN
INTRAMUSCULAR | Status: DC | PRN
  Administered 2019-05-29: 1 mg via INTRAVENOUS

## 2019-05-29 SURGICAL SUPPLY — 13 items
CATH OMNI FLUSH 5F 65CM (CATHETERS) ×2 IMPLANT
CATH TEMPO AQUA 5F 100CM (CATHETERS) ×2 IMPLANT
CLOSURE MYNX CONTROL 5F (Vascular Products) ×2 IMPLANT
KIT MICROPUNCTURE NIT STIFF (SHEATH) ×2 IMPLANT
KIT PV (KITS) ×2 IMPLANT
PATCH THROMBIX TOPICAL PLAIN (HEMOSTASIS) ×2 IMPLANT
SHEATH PINNACLE 5F 10CM (SHEATH) ×2 IMPLANT
SHEATH PROBE COVER 6X72 (BAG) ×2 IMPLANT
SYR MEDRAD MARK V 150ML (SYRINGE) ×2 IMPLANT
TRANSDUCER W/STOPCOCK (MISCELLANEOUS) ×2 IMPLANT
TRAY PV CATH (CUSTOM PROCEDURE TRAY) ×2 IMPLANT
WIRE BENTSON .035X145CM (WIRE) ×2 IMPLANT
WIRE HI TORQ VERSACORE J 260CM (WIRE) ×2 IMPLANT

## 2019-05-29 NOTE — Op Note (Signed)
    Patient name: [name] MRN: [id number] DOB: May 29, 1946 Sex: male  05/29/2019 Pre-operative Diagnosis: right toe ulceration Post-operative diagnosis:  Same Surgeon:  Erlene Quan C. Donzetta Matters, MD Procedure Performed: 1.  Ultrasound-guided cannulation left common femoral artery 2.  Aortogram bilateral lower extremity runoff 3.  Selection of right popliteal artery and below the knee angiogram 4.  Minx device closure left common femoral artery 5.  Moderate sedation with fentanyl and Versed for 32 minutes  Indications: 73 year old male with history of diabetes previously underwent angiogram and intervention of his tibioperoneal trunk.  He now has ulceration of the first 3 toes distally on the right.  He is indicated for angiogram possible intervention.   Findings: Aortogram and iliacs were free of flow-limiting stenosis.  On the left side appears to have runoff via the anterior tibial artery.  On the right no flow-limiting stenosis in the SFA down the level of the TP trunk.  The anterior tibial artery is heavily diseased and does not appear to proceed on to the foot.  Peroneal artery stops at the ankle.  Posterior tibial artery runs to the level of the heel and is nondiseased to that level.  There appears to be significant small vessel disease not amenable to percutaneous intervention.   Procedure:  The patient was identified in the holding area and taken to room 8.  The patient was then placed supine on the table and prepped and draped in the usual sterile fashion.  A time out was called.  Ultrasound was used to evaluate the left common femoral artery this was noted to be patent.  Areas anesthetized with 1% lidocaine cannulated with micropuncture needle under direct ultrasound visualization micropuncture needle followed the wire and sheath.  Image was saved the permanent record.  Fentanyl and Versed were administered and blood pressure, heart rate, O2 saturations were monitored throughout this case.   We placed a Bentson wire followed by 5 [place] sheath on the catheter level of L1 and aortogram was performed followed bilateral lower extremity runoff.  With the above findings I elected to cross the bifurcation with Bentson wire and Omni catheter.  I then used a versa core wire and straight catheter to the level to popliteal artery to perform direct injections below the right knee.  There is no indication for intervention appears that he has small vessel disease and will need local wound care of the toes.  He tolerated this procedure without any complication.  Contrast: 125cc  Janneth Krasner C. Donzetta Matters, MD [facility] [place] Office: [phone] Pager: [phone]

## 2019-05-29 NOTE — Progress Notes (Signed)
Discharge instructions reviewed with pt and pt wife via telephone . Voices understanding.

## 2019-05-29 NOTE — H&P (Signed)
   History and Physical Update  The patient was interviewed and re-examined.  The patient's previous History and Physical has been reviewed and is unchanged from recent office visit. Plan for angiogram focusing on right today.  Whitley Strycharz C. Donzetta Matters, MD [facility] [place] Office: [phone] Pager: [phone]  05/29/2019, 7:24 AM

## 2019-05-29 NOTE — Discharge Instructions (Signed)
Femoral Site Care °This sheet gives you information about how to care for yourself after your procedure. Your health care provider may also give you more specific instructions. If you have problems or questions, contact your health care provider. °What can I expect after the procedure? °After the procedure, it is common to have: °· Bruising that usually fades within 1-2 weeks. °· Tenderness at the site. °Follow these instructions at home: °Wound care °· Follow instructions from your health care provider about how to take care of your insertion site. Make sure you: °? Wash your hands with soap and water before you change your bandage (dressing). If soap and water are not available, use hand sanitizer. °? Change your dressing as told by your health care provider. °? Leave stitches (sutures), skin glue, or adhesive strips in place. These skin closures may need to stay in place for 2 weeks or longer. If adhesive strip edges start to loosen and curl up, you may trim the loose edges. Do not remove adhesive strips completely unless your health care provider tells you to do that. °· Do not take baths, swim, or use a hot tub until your health care provider approves. °· You may shower 24-48 hours after the procedure or as told by your health care provider. °? Gently wash the site with plain soap and water. °? Pat the area dry with a clean towel. °? Do not rub the site. This may cause bleeding. °· Do not apply powder or lotion to the site. Keep the site clean and dry. °· Check your femoral site every day for signs of infection. Check for: °? Redness, swelling, or pain. °? Fluid or blood. °? Warmth. °? Pus or a bad smell. °Activity °· For the first 2-3 days after your procedure, or as long as directed: °? Avoid climbing stairs as much as possible. °? Do not squat. °· Do not lift anything that is heavier than 10 lb (4.5 kg), or the limit that you are told, until your health care provider says that it is safe. °· Rest as  directed. °? Avoid sitting for a long time without moving. Get up to take short walks every 1-2 hours. °· Do not drive for 24 hours if you were given a medicine to help you relax (sedative). °General instructions °· Take over-the-counter and prescription medicines only as told by your health care provider. °· Keep all follow-up visits as told by your health care provider. This is important. °Contact a health care provider if you have: °· A fever or chills. °· You have redness, swelling, or pain around your insertion site. °Get help right away if: °· The catheter insertion area swells very fast. °· You pass out. °· You suddenly start to sweat or your skin gets clammy. °· The catheter insertion area is bleeding, and the bleeding does not stop when you hold steady pressure on the area. °· The area near or just beyond the catheter insertion site becomes pale, cool, tingly, or numb. °These symptoms may represent a serious problem that is an emergency. Do not wait to see if the symptoms will go away. Get medical help right away. Call your local emergency services (911 in the [place] Do not drive yourself to the hospital. °Summary °· After the procedure, it is common to have bruising that usually fades within 1-2 weeks. °· Check your femoral site every day for signs of infection. °· Do not lift anything that is heavier than 10 lb (4.5 kg), or the   limit that you are told, until your health care provider says that it is safe. °This information is not intended to replace advice given to you by your health care provider. Make sure you discuss any questions you have with your health care provider. °Document Released: 08/10/2014 Document Revised: 12/20/2017 Document Reviewed: 12/20/2017 °Elsevier Interactive Patient Education © 2019 Elsevier Inc. ° °

## 2019-05-30 MED FILL — Lidocaine HCl Local Preservative Free (PF) Inj 1%: INTRAMUSCULAR | Qty: 30 | Status: AC

## 2019-06-19 ENCOUNTER — Telehealth: Payer: Self-pay

## 2019-06-19 NOTE — Telephone Encounter (Signed)
Pt said that one of his ischemic toes has gotten worse. He said that the skin is black around it and he has noticed some pus. Asked to get an appt with Dr Donzetta Matters this week.   Advised pt that Dr Donzetta Matters not in the office this week but we could make an appt for him to see Vinnie Level and he was ok with this.   York Cerise, CMA

## 2019-06-21 ENCOUNTER — Ambulatory Visit: Payer: No Typology Code available for payment source | Admitting: Family

## 2019-07-20 ENCOUNTER — Telehealth (HOSPITAL_COMMUNITY): Payer: Self-pay | Admitting: Rehabilitation

## 2019-07-20 NOTE — Telephone Encounter (Signed)

## 2019-07-21 ENCOUNTER — Other Ambulatory Visit: Payer: Self-pay

## 2019-07-21 ENCOUNTER — Ambulatory Visit (INDEPENDENT_AMBULATORY_CARE_PROVIDER_SITE_OTHER): Payer: Self-pay | Admitting: Family

## 2019-07-21 ENCOUNTER — Encounter: Payer: Self-pay | Admitting: Family

## 2019-07-21 VITALS — BP 133/53 | HR 78 | Temp 98.3°F | Resp 16 | Ht 68.0 in | Wt 225.0 lb

## 2019-07-21 DIAGNOSIS — Z9862 Peripheral vascular angioplasty status: Secondary | ICD-10-CM

## 2019-07-21 DIAGNOSIS — I779 Disorder of arteries and arterioles, unspecified: Secondary | ICD-10-CM

## 2019-07-21 DIAGNOSIS — L97519 Non-pressure chronic ulcer of other part of right foot with unspecified severity: Secondary | ICD-10-CM

## 2019-07-21 MED ORDER — CEPHALEXIN 500 MG PO CAPS: 500.0000 mg | ORAL_CAPSULE | Freq: Four times a day (QID) | ORAL | 0 refills | Status: DC

## 2019-07-21 NOTE — Patient Instructions (Signed)
Peripheral Vascular Disease  Peripheral vascular disease (PVD) is a disease of the blood vessels that are not part of your heart and brain. A simple term for PVD is poor circulation. In most cases, PVD narrows the blood vessels that carry blood from your heart to the rest of your body. This can reduce the supply of blood to your arms, legs, and internal organs, like your stomach or kidneys. However, PVD most often affects a person's lower legs and feet. Without treatment, PVD tends to get worse. PVD can also lead to acute ischemic limb. This is when an arm or leg suddenly cannot get enough blood. This is a medical emergency. Follow these instructions at home: Lifestyle  Do not use any products that contain nicotine or tobacco, such as cigarettes and e-cigarettes. If you need help quitting, ask your doctor.  Lose weight if you are overweight. Or, stay at a healthy weight as told by your doctor.  Eat a diet that is low in fat and cholesterol. If you need help, ask your doctor.  Exercise regularly. Ask your doctor for activities that are right for you. General instructions  Take over-the-counter and prescription medicines only as told by your doctor.  Take good care of your feet: ? Wear comfortable shoes that fit well. ? Check your feet often for any cuts or sores.  Keep all follow-up visits as told by your doctor This is important. Contact a doctor if:  You have cramps in your legs when you walk.  You have leg pain when you are at rest.  You have coldness in a leg or foot.  Your skin changes.  You are unable to get or have an erection (erectile dysfunction).  You have cuts or sores on your feet that do not heal. Get help right away if:  Your arm or leg turns cold, numb, and blue.  Your arms or legs become red, warm, swollen, painful, or numb.  You have chest pain.  You have trouble breathing.  You suddenly have weakness in your face, arm, or leg.  You become very  confused or you cannot speak.  You suddenly have a very bad headache.  You suddenly cannot see. Summary  Peripheral vascular disease (PVD) is a disease of the blood vessels.  A simple term for PVD is poor circulation. Without treatment, PVD tends to get worse.  Treatment may include exercise, low fat and low cholesterol diet, and quitting smoking. This information is not intended to replace advice given to you by your health care provider. Make sure you discuss any questions you have with your health care provider. Document Released: 03/03/2010 Document Revised: 11/19/2017 Document Reviewed: 01/14/2017 Elsevier Patient Education  2020 Elsevier Inc.  

## 2019-07-21 NOTE — Progress Notes (Signed)
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 male who is s/p arteriogram of lower extremities on 05-29-19 by Dr. Randie Heinzain.   Findings: Aortogram and iliacs were free of flow-limiting stenosis.  On the left side appears to have runoff via the anterior tibial artery.  On the right no flow-limiting stenosis in the SFA down the level of the TP trunk.  The anterior tibial artery is heavily diseased and does not appear to proceed on to the foot.  Peroneal artery stops at the ankle.  Posterior tibial artery runs to the level of the heel and is nondiseased to that level.  There appears to be significant small vessel disease not amenable to percutaneous intervention.  He returns today for follow up.   Pt states the topical antibiotic ointment that he is applying to his right 2nd toe has improved the sore.  He is not taking any po antibx now.   He states that he is scheduled for an xray of his right foot on 07-27-19 at [facility] in [place]. Pt states this was ordered after he was seen [place] the [facility] [place] on [place].  He was also seen in the [facility] recently for chest pain, states no heart problem found at that time.  He states constant left sternal moderate pain, same as he had at that visit, he denies dyspnea.   He is also s/p s/pright anterior tibial and posterior tibial atherectomy with balloon angioplasty of the same vesselson 03-07-18 by Dr. Randie Heinzain. This was done for right great toe 1 cm tip ulceration as well as third toe tip ulceration. Hehadhealedthese wounds.  [facility]. He remains on aspirin Plavix.   Dr. Randie Heinzain evaluated pt on 04-01-18. At that timeABIswerenoncompressible bilaterally with triphasic waveforms. His right lower extremity duplex demonstrateedtriphasic waveforms throughout his posterior tibial artery all the way to the level  of the ankle and the peroneal artery is occluded and this was known from angiogram. Thewounds werestable and on the way to healing. He was tocontinue his aspirin Plavix follow-up in 3 months with repeat studies.  He states he had a stroke in 2005 as manifested by feeling disoriented and trouble walking, this was transient.   He admits to not walking much, has No barriers to walking.  He had a back evaluation by a chiropractor in 2019, states his large abdomen has made his spine "go way out of alignment".   He uses a C-PAP for his OSA.   Diabetic: Yes Tobacco use: non-smoker  Pt meds include: Statin :Yes Betablocker: Yes ASA: Yes, 325 mg daily Other anticoagulants/antiplatelets: Plavix   Past Medical History:  Diagnosis Date  . CAD, AUTOLOGOUS BYPASS GRAFT   . DIABETES, TYPE 2   . DYSPNEA   . HYPERLIPIDEMIA   . OBESITY   . OBSTRUCTIVE SLEEP APNEA   . Old myocardial infarction     Social History Social History   Tobacco Use  . Smoking status: Former Smoker    Types: Pipe  . Smokeless tobacco: Never Used  Substance Use Topics  . Alcohol use: Never    Frequency: Never  . Drug use: Never    Family History Family History  Family history unknown: Yes    Past Surgical History:  Procedure Laterality Date  . LOWER EXTREMITY ANGIOGRAPHY N/A 03/07/2018   Procedure: LOWER EXTREMITY  ANGIOGRAPHY;  Surgeon: Maeola Harmanain, Brandon Christopher, MD;  [facility];  Service: Cardiovascular;  Laterality: N/A;  . LOWER EXTREMITY ANGIOGRAPHY Bilateral 05/29/2019   Procedure: LOWER EXTREMITY ANGIOGRAPHY;  Surgeon: Maeola Harmanain, Brandon Christopher, MD;  [facility];  Service: Cardiovascular;  Laterality: Bilateral;  . PERIPHERAL VASCULAR ATHERECTOMY Right 03/07/2018   Procedure: PERIPHERAL VASCULAR ATHERECTOMY;  Surgeon: Maeola Harmanain, Brandon Christopher, MD;  [facility];  Service: Cardiovascular;  Laterality: Right;  anterioer tibial     Allergies  Allergen Reactions  . Ace Inhibitors     Heart attack   . Lisinopril     Heart attack    Current Outpatient Medications  Medication Sig Dispense Refill  . ammonium lactate (LAC-HYDRIN) 12 % lotion Apply 1 application topically 2 (two) times a day.     Marland Kitchen. aspirin EC 325 MG tablet Take 325 mg by mouth daily.    Marland Kitchen. atorvastatin (LIPITOR) 40 MG tablet Take 20 mg by mouth at bedtime.     . Cholecalciferol 2000 units TABS Take 2,000 Units by mouth daily.     . citalopram (CELEXA) 40 MG tablet Take 20 mg by mouth daily.     . clopidogrel (PLAVIX) 75 MG tablet Take 75 mg by mouth daily.    . Coenzyme Q10 (COQ-10) 200 MG CAPS Take 200 mg by mouth daily.    . fluorouracil (EFUDEX) 5 % cream Apply 1 application topically 2 (two) times daily as needed (cancerous flares).     . furosemide (LASIX) 40 MG tablet Take 40 mg by mouth daily.    Marland Kitchen. glipiZIDE (GLUCOTROL) 10 MG tablet Take 20 mg by mouth 2 (two) times a day.     Chilton Si. Green Tea, Camellia sinensis, (GREEN TEA EXTRACT PO) Take 2 tablets by mouth 2 (two) times a day.    Marland Kitchen. HYDROPHILIC EX Apply 1 application topically 2 (two) times a day.     . ibuprofen (ADVIL) 200 MG tablet Take 400 mg by mouth every 4 (four) hours as needed for headache or moderate pain.    . metFORMIN (GLUCOPHAGE) 850 MG tablet Take 850 mg by mouth 3 (three) times daily.     . metoprolol tartrate (LOPRESSOR) 50 MG tablet Take 50 mg by mouth 2 (two) times daily.    . Multiple Vitamin (MULTIVITAMIN WITH MINERALS) TABS tablet Take 1 tablet by mouth 2 (two) times a day.    Marland Kitchen. OVER THE COUNTER MEDICATION Take 1 tablet by mouth daily. Neuriva otc supplement    . OVER THE COUNTER MEDICATION Take 2 tablets by mouth at bedtime. Olly Sleep    . OVER THE COUNTER MEDICATION Take 1 tablet by mouth 3 (three) times daily with meals. Sugar Blockers    . Semaglutide, 1 MG/DOSE, (OZEMPIC, 1 MG/DOSE,) 2 MG/1.5ML SOPN Inject 1 mg into the skin every Monday.    . traZODone (DESYREL) 50 MG  tablet Take 25 mg by mouth at bedtime.     . gabapentin (NEURONTIN) 100 MG capsule Take 200 mg by mouth at bedtime.    . nitroGLYCERIN (NITROSTAT) 0.4 MG SL tablet Place 0.4 mg under the tongue every 5 (five) minutes as needed for chest pain.     No current facility-administered medications for this visit.     ROS: See HPI for pertinent positives and negatives.   Physical Examination  Vitals:   07/21/19 0942  BP: (!) 133/53  Pulse: 78  Resp: 16  Temp: 98.3 F (36.8 C)  TempSrc:  Temporal  SpO2: 96%  Weight: 225 lb (102.1 kg)  Height: 5\' 8"  (1.727 m)   Body mass index is 34.21 kg/m.  General: A&O x 3, WDWN, obese male. Gait: limp HEENT: No gross abnormalities. Large neck Pulmonary: Respirations are non labored, CTAB, good air movement in all fields, no rales, rhonchi, or wheezes.  Cardiac: regular rhythm with occasional short runs of irregular rhythm, no detected murmur.         Carotid Bruits Right Left   Negative Negative   Radial and ulnar pulses are not palpable bilaterally. Bilateral brachial pulses are 1+ palpable   Adominal aortic pulse is not palpable                         VASCULAR EXAM: Extremities with ischemic changes at right toes: moderate rubor, without Gangrene; with open wound at right 3rd toe tip, see photos below   Right 3rd toe tip painful ulcer, with fluctuation, moist small area, size of pin head, in the center    Right 3rd toe tip ulcer                                                                                                            LE Pulses Right Left       FEMORAL  1+ palpable  2+ palpable        POPLITEAL  not palpable   1+ palpable       POSTERIOR TIBIAL  not palpable, + brisk multiphasic Doppler signal   not palpable, + brisk Doppler signal        DORSALIS PEDIS      ANTERIOR TIBIAL not palpable, faint monophasic Doppler signal  not palpable, + brisk monophasic signal    Abdomen: softly distended, NT, no palpable  masses. Skin: no rashes, no cellulitis, no ulcers noted. Musculoskeletal: no muscle wasting or atrophy.  Neurologic: A&O X 3; appropriate affect, Sensation is normal; MOTOR FUNCTION:  moving all extremities equally, motor strength 5/5 throughout. Speech is fluent/normal. CN 2-12 intact. Psychiatric: Thought content is normal, mood appropriate for clinical situation.    ASSESSMENT: [name] is a 73 y.o. male who is s/p arteriogram of lower extremities on 05-29-19 by Dr. Donzetta Matters.   Findings: Aortogram and iliacs were free of flow-limiting stenosis.  On the left side appears to have runoff via the anterior tibial artery.  On the right no flow-limiting stenosis in the SFA down the level of the TP trunk.  The anterior tibial artery is heavily diseased and does not appear to proceed on to the foot.  Peroneal artery stops at the ankle.  Posterior tibial artery runs to the level of the heel and is nondiseased to that level.  There appears to be significant small vessel disease not amenable to percutaneous intervention.   He is also s/p right anterior tibial and posterior tibial atherectomy with balloon angioplasty of the same vesselson 03-07-18 by Dr. Donzetta Matters. This was done for right great toe 1 cm tip ulceration as well  as third toe tip ulceration. Hehashealedthese wounds.  He has right 3rd toe tip ulcer with rubor, pain, and fluctuation. He states that he is scheduled for a right foot xray next week as [facility], likely to r/o osteomyelitis.  Will start on po Keflex, continue topical antibx ointment daily dressing change to right 3rd toe ulcer.  See Plan.  His atherosclerotic risk factors include DM, CAD, obesity, sedentary lifestyle, and treated OSA.  Fortunately he has never used tobacco.   PLAN:  Based on the patient's HPI and examination, and after discussing with Dr. Randie Heinzain, pt will return to clinic in 2-3 weeks, see Dr. Randie Heinzain, to discuss options to address non healing ulcer  right 3rd toe.  Will prescribe Keflex 500 mg po qid x 2 weeks, disp #54, 0 refills, sent electronically to his [facility]. I advised ot to start taking today.   I discussed in depth with the patient the nature of atherosclerosis, and emphasized the importance of maximal medical management including strict control of blood pressure, blood glucose, and lipid levels, obtaining regular exercise, and continued cessation of smoking.  The patient is aware that without maximal medical management the underlying atherosclerotic disease process will progress, limiting the benefit of any interventions.  The patient was given information about PAD including signs, symptoms, treatment, what symptoms should prompt the patient to seek immediate medical care, and risk reduction measures to take.  Charisse MarchSuzanne Sahir Tolson, RN, MSN, FNP-C [facility] [place] Office Phone: [phone]  Clinic MD: Randie HeinzCain  07/21/19 9:52 AM

## 2019-08-10 ENCOUNTER — Telehealth (HOSPITAL_COMMUNITY): Payer: Self-pay | Admitting: Rehabilitation

## 2019-08-10 NOTE — Telephone Encounter (Signed)

## 2019-08-11 ENCOUNTER — Ambulatory Visit (INDEPENDENT_AMBULATORY_CARE_PROVIDER_SITE_OTHER): Payer: No Typology Code available for payment source | Admitting: Vascular Surgery

## 2019-08-11 ENCOUNTER — Other Ambulatory Visit: Payer: Self-pay

## 2019-08-11 ENCOUNTER — Encounter: Payer: Self-pay | Admitting: Vascular Surgery

## 2019-08-11 VITALS — BP 122/58 | HR 80 | Temp 97.8°F | Resp 18 | Ht 68.0 in | Wt 227.0 lb

## 2019-08-11 DIAGNOSIS — I779 Disorder of arteries and arterioles, unspecified: Secondary | ICD-10-CM | POA: Diagnosis not present

## 2019-08-11 NOTE — Progress Notes (Signed)
Patient ID: [name], male   DOB: 07-24-1946, 73 y.o.   MRN: [id number]  Reason for Consult: PVD   Referred by No ref. provider found  Subjective:     HPI:  [name] is a 73 y.o. male has history of right lower extremity posterior tibial atherectomy and balloon angioplasty and March 2019 for toe tip ulceration on the right great toe.  These wounds subsequently healed.  More recently had sore on the right second toe.  Underwent repeat angiogram which demonstrated runoff via the posterior tibial artery on the right the anterior tibial artery was diseased but gave out prior to the DP.  The left side had runoff via the anterior tibial artery.  No intervention was undertaken.  He returns today for follow-up.  All wounds have healed all his wounds.  He is walking without limitation.  He has diabetic coronary artery disease hyperlipidemia as risk factors.  Does take aspirin and a statin.  Smokes about 2 cigars a year.  Past Medical History:  Diagnosis Date  . CAD, AUTOLOGOUS BYPASS GRAFT   . DIABETES, TYPE 2   . DYSPNEA   . HYPERLIPIDEMIA   . OBESITY   . OBSTRUCTIVE SLEEP APNEA   . Old myocardial infarction    Family History  Problem Relation Age of Onset  . Diabetes Mother   . Alcoholism Father    Past Surgical History:  Procedure Laterality Date  . LOWER EXTREMITY ANGIOGRAPHY N/A 03/07/2018   Procedure: LOWER EXTREMITY ANGIOGRAPHY;  Surgeon: Maeola Harmanain, Dinnis Rog Christopher, MD;  Location: [facility] INVASIVE CV LAB;  Service: Cardiovascular;  Laterality: N/A;  . LOWER EXTREMITY ANGIOGRAPHY Bilateral 05/29/2019   Procedure: LOWER EXTREMITY ANGIOGRAPHY;  Surgeon: Maeola Harmanain, Donnia Poplaski Christopher, MD;  [facility];  Service: Cardiovascular;  Laterality: Bilateral;  . PERIPHERAL VASCULAR ATHERECTOMY Right 03/07/2018   Procedure: PERIPHERAL VASCULAR ATHERECTOMY;  Surgeon: Maeola Harmanain, Desirey Keahey Christopher, MD;  [facility];  Service: Cardiovascular;  Laterality: Right;   anterioer tibial    Short Social History:  Social History   Tobacco Use  . Smoking status: Former Smoker    Types: Pipe  . Smokeless tobacco: Never Used  Substance Use Topics  . Alcohol use: Never    Frequency: Never    Allergies  Allergen Reactions  . Ace Inhibitors     Heart attack   . Lisinopril     Heart attack    Current Outpatient Medications  Medication Sig Dispense Refill  . ammonium lactate (LAC-HYDRIN) 12 % lotion Apply 1 application topically 2 (two) times a day.     Marland Kitchen. aspirin EC 325 MG tablet Take 325 mg by mouth daily.    Marland Kitchen. atorvastatin (LIPITOR) 40 MG tablet Take 20 mg by mouth at bedtime.     . cephALEXin (KEFLEX) 500 MG capsule Take 1 capsule (500 mg total) by mouth 4 (four) times daily. 54 capsule 0  . Cholecalciferol 2000 units TABS Take 2,000 Units by mouth daily.     . citalopram (CELEXA) 40 MG tablet Take 20 mg by mouth daily.     . clopidogrel (PLAVIX) 75 MG tablet Take 75 mg by mouth daily.    . Coenzyme Q10 (COQ-10) 200 MG CAPS Take 200 mg by mouth daily.    . fluorouracil (EFUDEX) 5 % cream Apply 1 application topically 2 (two) times daily as needed (cancerous flares).     . furosemide (LASIX) 40 MG tablet Take 40 mg by mouth daily.    .Marland Kitchen  gabapentin (NEURONTIN) 100 MG capsule Take 200 mg by mouth at bedtime.    Marland Kitchen. glipiZIDE (GLUCOTROL) 10 MG tablet Take 20 mg by mouth 2 (two) times a day.     Chilton Si. Green Tea, Camellia sinensis, (GREEN TEA EXTRACT PO) Take 2 tablets by mouth 2 (two) times a day.    Marland Kitchen. HYDROPHILIC EX Apply 1 application topically 2 (two) times a day.     . ibuprofen (ADVIL) 200 MG tablet Take 400 mg by mouth every 4 (four) hours as needed for headache or moderate pain.    . metFORMIN (GLUCOPHAGE) 850 MG tablet Take 850 mg by mouth 3 (three) times daily.     . metoprolol tartrate (LOPRESSOR) 50 MG tablet Take 50 mg by mouth 2 (two) times daily.    . Multiple Vitamin (MULTIVITAMIN WITH MINERALS) TABS tablet Take 1 tablet by mouth 2 (two) times  a day.    Marland Kitchen. OVER THE COUNTER MEDICATION Take 1 tablet by mouth daily. Neuriva otc supplement    . OVER THE COUNTER MEDICATION Take 2 tablets by mouth at bedtime. Olly Sleep    . OVER THE COUNTER MEDICATION Take 1 tablet by mouth 3 (three) times daily with meals. Sugar Blockers    . Semaglutide, 1 MG/DOSE, (OZEMPIC, 1 MG/DOSE,) 2 MG/1.5ML SOPN Inject 1 mg into the skin every Monday.    . traZODone (DESYREL) 50 MG tablet Take 25 mg by mouth at bedtime.     . nitroGLYCERIN (NITROSTAT) 0.4 MG SL tablet Place 0.4 mg under the tongue every 5 (five) minutes as needed for chest pain.     No current facility-administered medications for this visit.     Review of Systems  Constitutional: Positive for unexpected weight change.  HENT: HENT negative.  Eyes: Eyes negative.  Respiratory: Positive for shortness of breath.  Cardiovascular: Positive for leg swelling.  GI: Gastrointestinal negative.  Skin: Skin negative.  Neurological: Neurological negative. Hematologic: Hematologic/lymphatic negative.  Psychiatric: Psychiatric negative.        Objective:  Objective   Vitals:   08/11/19 0941  BP: (!) 122/58  Pulse: 80  Resp: 18  Temp: 97.8 F (36.6 C)  TempSrc: Temporal  SpO2: 100%  Weight: 227 lb (103 kg)  Height: 5\' 8"  (1.727 m)   Body mass index is 34.52 kg/m.  Physical Exam Constitutional:      Appearance: Normal appearance.  HENT:     Head: Normocephalic.  Cardiovascular:     Rate and Rhythm: Normal rate.     Pulses:          Radial pulses are 2+ on the right side and 2+ on the left side.       Femoral pulses are 2+ on the right side and 2+ on the left side.      Popliteal pulses are 2+ on the right side and 2+ on the left side.  Pulmonary:     Effort: Pulmonary effort is normal.  Abdominal:     General: Abdomen is flat.     Palpations: Abdomen is soft. There is no mass.  Skin:    Capillary Refill: Capillary refill takes less than 2 seconds.  Neurological:     General:  No focal deficit present.     Mental Status: He is alert.  Psychiatric:        Mood and Affect: Mood normal.        Behavior: Behavior normal.        Thought Content: Thought content normal.  Judgment: Judgment normal.     Data: No new studies.     Assessment/Plan:     73 year old male follows up after angiography that was diagnostic no intervention was undertaken.  He is subsequently healed his wounds on the right foot has brisk capillary refill.  We will follow him in a few months with repeat studies.  I discussed with him protecting his toes.  He will continue aspirin and statin.     Waynetta Sandy MD [facility]

## 2019-11-20 ENCOUNTER — Telehealth: Payer: Self-pay | Admitting: *Deleted

## 2019-11-20 NOTE — Telephone Encounter (Signed)
Virtual Visit Pre-Appointment Phone Call  Today, I spoke with [name] and performed the following actions:  1. I explained that we are currently trying to limit exposure to the COVID-19 virus by seeing patients at home rather than in the office.  I explained that the visits are best done by video, but can be done by telephone.  I asked the patient if a virtual visit that the patient would like to try instead of coming into the office. [name] agreed to proceed with the virtual visit scheduled with Vinnie Level Nickel on 11/24/19.         2. I confirmed the BEST phone number to call the day of the visit and- I included this in appointment notes.  3. I asked if the patient had access to (through a family member/friend) a smartphone with video capability to be used for his visit?"  The patient said yes -   4. I confirmed consent by  a. sending through [place] or by email the [place] as written at the end of this message or  b. verbally as listed below. i. This visit is being performed in the setting of COVID-19. ii. All virtual visits are billed to your insurance company just like a normal visit would be.   iii. We'd like you to understand that the technology does not allow for your provider to perform an examination, and thus may limit your provider's ability to fully assess your condition.  iv. If your provider identifies any concerns that need to be evaluated in person, we will make arrangements to do so.   v. Finally, though the technology is pretty good, we cannot assure that it will always work on either your or our end, and in the setting of a video visit, we may have to convert it to a phone-only visit.  In either situation, we cannot ensure that we have a secure connection.   vi. Are you willing to proceed?"  STAFF: Did the patient verbally acknowledge consent to telehealth visit? Document YES/NO here: YES  2. I advised the patient  to be prepared - I asked that the patient, on the day of his visit, record any information possible with the equipment at his home, such as blood pressure, pulse, oxygen saturation, and your weight and write them all down. I asked the patient to have a pen and paper handy nearby the day of the visit as well.  3. If the patient was scheduled for a video visit, I informed the patient that the visit with the doctor would start with a text to the smartphone # given to [place] by the patient.         If the patient was scheduled for a telephone call, I informed the patient that the visit with the doctor would start with a call to the telephone # given to [place] by the patient.  4. I Informed patient they will receive a phone call 15 minutes prior to their appointment time from a [place] or nurse to review medications, allergies, etc. to prepare for the visit.    TELEPHONE CALL NOTE  [name] has been deemed a candidate for a follow-up tele-health visit to limit community exposure during the Covid-19 pandemic. I spoke with the patient via phone to ensure availability of phone/video source, confirm preferred email & phone number, and discuss instructions and expectations.  I reminded [name] to be prepared with any  vital sign and/or heart rhythm information that could potentially be obtained via home monitoring, at the time of his visit. I reminded [name] to expect a phone call prior to his visit.  Rudi Coco, NT 11/20/2019 2:04 PM     FULL LENGTH CONSENT FOR TELE-HEALTH VISIT   I hereby voluntarily request, consent and authorize [facility] and its employed or contracted physicians, physician assistants, nurse practitioners or other licensed health care professionals (the Practitioner), to provide me with telemedicine health care services (the "Services") as deemed necessary by the treating Practitioner. I acknowledge and consent to receive the Services by the Practitioner  via telemedicine. I understand that the telemedicine visit will involve communicating with the Practitioner through live audiovisual communication technology and the disclosure of certain medical information by electronic transmission. I acknowledge that I have been given the opportunity to request an in-person assessment or other available alternative prior to the telemedicine visit and am voluntarily participating in the telemedicine visit.  I understand that I have the right to withhold or withdraw my consent to the use of telemedicine in the course of my care at any time, without affecting my right to future care or treatment, and that the Practitioner or I may terminate the telemedicine visit at any time. I understand that I have the right to inspect all information obtained and/or recorded in the course of the telemedicine visit and may receive copies of available information for a reasonable fee.  I understand that some of the potential risks of receiving the Services via telemedicine include:  Marland Kitchen Delay or interruption in medical evaluation due to technological equipment failure or disruption; . Information transmitted may not be sufficient (e.g. poor resolution of images) to allow for appropriate medical decision making by the Practitioner; and/or  . In rare instances, security protocols could fail, causing a breach of personal health information.  Furthermore, I acknowledge that it is my responsibility to provide information about my medical history, conditions and care that is complete and accurate to the best of my ability. I acknowledge that Practitioner's advice, recommendations, and/or decision may be based on factors not within their control, such as incomplete or inaccurate data provided by me or distortions of diagnostic images or specimens that may result from electronic transmissions. I understand that the practice of medicine is not an exact science and that Practitioner makes no warranties  or guarantees regarding treatment outcomes. I acknowledge that I will receive a copy of this consent concurrently upon execution via email to the email address I last provided but may also request a printed copy by calling the office of [facility].    I understand that my insurance will be billed for this visit.   I have read or had this consent read to me. . I understand the contents of this consent, which adequately explains the benefits and risks of the Services being provided via telemedicine.  . I have been provided ample opportunity to ask questions regarding this consent and the Services and have had my questions answered to my satisfaction. . I give my informed consent for the services to be provided through the use of telemedicine in my medical care  By participating in this telemedicine visit I agree to the above.

## 2019-11-24 ENCOUNTER — Encounter (HOSPITAL_COMMUNITY): Payer: No Typology Code available for payment source

## 2019-11-24 ENCOUNTER — Ambulatory Visit: Payer: No Typology Code available for payment source | Admitting: Family

## 2019-12-06 ENCOUNTER — Other Ambulatory Visit: Payer: Self-pay

## 2019-12-06 ENCOUNTER — Telehealth (HOSPITAL_COMMUNITY): Payer: Self-pay | Admitting: *Deleted

## 2019-12-06 DIAGNOSIS — I779 Disorder of arteries and arterioles, unspecified: Secondary | ICD-10-CM

## 2019-12-06 NOTE — Telephone Encounter (Signed)

## 2019-12-07 ENCOUNTER — Encounter: Payer: Self-pay | Admitting: Family

## 2019-12-07 ENCOUNTER — Ambulatory Visit (HOSPITAL_COMMUNITY)
Admission: RE | Admit: 2019-12-07 | Discharge: 2019-12-07 | Disposition: A | Discharge status: Home / Self Care | Payer: Medicare Other | Source: Ambulatory Visit | Attending: Family | Admitting: Family

## 2019-12-07 ENCOUNTER — Ambulatory Visit (INDEPENDENT_AMBULATORY_CARE_PROVIDER_SITE_OTHER): Payer: Medicare Other | Admitting: Family

## 2019-12-07 ENCOUNTER — Other Ambulatory Visit: Payer: Self-pay

## 2019-12-07 DIAGNOSIS — L97519 Non-pressure chronic ulcer of other part of right foot with unspecified severity: Secondary | ICD-10-CM

## 2019-12-07 DIAGNOSIS — I779 Disorder of arteries and arterioles, unspecified: Secondary | ICD-10-CM | POA: Diagnosis not present

## 2019-12-07 DIAGNOSIS — Z87891 Personal history of nicotine dependence: Secondary | ICD-10-CM | POA: Diagnosis not present

## 2019-12-07 DIAGNOSIS — Z9862 Peripheral vascular angioplasty status: Secondary | ICD-10-CM

## 2019-12-07 NOTE — Patient Instructions (Signed)
Peripheral Vascular Disease  Peripheral vascular disease (PVD) is a disease of the blood vessels that are not part of your heart and brain. A simple term for PVD is poor circulation. In most cases, PVD narrows the blood vessels that carry blood from your heart to the rest of your body. This can reduce the supply of blood to your arms, legs, and internal organs, like your stomach or kidneys. However, PVD most often affects a person's lower legs and feet. Without treatment, PVD tends to get worse. PVD can also lead to acute ischemic limb. This is when an arm or leg suddenly cannot get enough blood. This is a medical emergency. Follow these instructions at home: Lifestyle  Do not use any products that contain nicotine or tobacco, such as cigarettes and e-cigarettes. If you need help quitting, ask your doctor.  Lose weight if you are overweight. Or, stay at a healthy weight as told by your doctor.  Eat a diet that is low in fat and cholesterol. If you need help, ask your doctor.  Exercise regularly. Ask your doctor for activities that are right for you. General instructions  Take over-the-counter and prescription medicines only as told by your doctor.  Take good care of your feet: ? Wear comfortable shoes that fit well. ? Check your feet often for any cuts or sores.  Keep all follow-up visits as told by your doctor This is important. Contact a doctor if:  You have cramps in your legs when you walk.  You have leg pain when you are at rest.  You have coldness in a leg or foot.  Your skin changes.  You are unable to get or have an erection (erectile dysfunction).  You have cuts or sores on your feet that do not heal. Get help right away if:  Your arm or leg turns cold, numb, and blue.  Your arms or legs become red, warm, swollen, painful, or numb.  You have chest pain.  You have trouble breathing.  You suddenly have weakness in your face, arm, or leg.  You become very  confused or you cannot speak.  You suddenly have a very bad headache.  You suddenly cannot see. Summary  Peripheral vascular disease (PVD) is a disease of the blood vessels.  A simple term for PVD is poor circulation. Without treatment, PVD tends to get worse.  Treatment may include exercise, low fat and low cholesterol diet, and quitting smoking. This information is not intended to replace advice given to you by your health care provider. Make sure you discuss any questions you have with your health care provider. Document Released: 03/03/2010 Document Revised: 11/19/2017 Document Reviewed: 01/14/2017 Elsevier Patient Education  2020 Elsevier Inc.  

## 2019-12-07 NOTE — Progress Notes (Addendum)
Virtual Visit via Telephone Note   I connected with [name] on 12/07/2019 using the [employer] by telephone and verified that I was speaking with the correct person using two identifiers. Patient was located at his home and accompanied by himself. I am located at the [facility] office[facility]clinic.   The limitations of evaluation and management by telemedicine and the availability of in person appointments have been previously discussed with the patient and are documented in the patients chart. The patient expressed understanding and consented to proceed.  PCP: Patient, No Pcp Per  Chief Complaint: Follow up PAD  History of Present Illness: [name] is a 73 y.o. male who is s/p arteriogram of lower extremities on 05-29-19 by Dr. Randie Heinz.   Findings:Aortogram and iliacs were free of flow-limiting stenosis. On the left side appears to have runoff via the anterior tibial artery. On the right no flow-limiting stenosis in the SFA down the level of the TP trunk. The anterior tibial artery is heavily diseased and does not appear to proceed on to the foot. Peroneal artery stops at the ankle. Posterior tibial artery runs to the level of the heel and is nondiseased to that level. There appears to be significant small vessel disease not amenable to percutaneous intervention.  He is also s/p s/pright anterior tibial and posterior tibial atherectomy with balloon angioplasty of the same vesselson 03-07-18 by Dr. Randie Heinz. This was done for right great toe 1 cm tip ulceration as well as third toe tip ulceration. Hehadhealedthese wounds.  [facility]. He remains on aspirin Plavix.    Pt states he has improving ulcers on right 2nd and 3rd toes, [facility]   Dr. Randie Heinz last evaluated pt on 08-11-19. At that time pt subsequently healed the wounds on his right foot, had brisk capillary refill. Pt was to return in a few months with repeat  studies.  Dr. Randie Heinz discussed with pt protecting his toes.  Pt was to continue aspirin and statin.   He continues to have occasional  chest pain, states he has a cardiologist that he needs to follow up with, I advised him to call his cardiologist as soon as possible.   He states he had a stroke in 2005 as manifested by feeling disoriented and trouble walking, this was transient.   He admits to not walking much, has no barriers to walking other than bilateral sciatica.   He had a back evaluation by a chiropractor in 2019, states his large abdomen has made his spine "go way out of alignment".  He uses a C-PAP for his OSA.   Diabetic:Yes, states he does not know what his last A1C was, states his CBG's are 140-170 Tobacco use:non-smoker  Pt meds include: Statin :Yes Betablocker:Yes ASA:Yes, 325 mg daily Other anticoagulants/antiplatelets:Plavix   Past Medical History:  Diagnosis Date  . CAD, AUTOLOGOUS BYPASS GRAFT   . DIABETES, TYPE 2   . DYSPNEA   . HYPERLIPIDEMIA   . OBESITY   . OBSTRUCTIVE SLEEP APNEA   . Old myocardial infarction     Past Surgical History:  Procedure Laterality Date  . LOWER EXTREMITY ANGIOGRAPHY N/A 03/07/2018   Procedure: LOWER EXTREMITY ANGIOGRAPHY;  Surgeon: Maeola Harman, MD;  Location[facility] INVASIVE CV LAB;  Service: Cardiovascular;  Laterality: N/A;  . LOWER EXTREMITY ANGIOGRAPHY Bilateral 05/29/2019   Procedure: LOWER EXTREMITY ANGIOGRAPHY;  Surgeon: Maeola Harman, MD;  Location: [facility] INVASIVE CV  LAB;  Service: Cardiovascular;  Laterality: Bilateral;  . PERIPHERAL VASCULAR ATHERECTOMY Right 03/07/2018   Procedure: PERIPHERAL VASCULAR ATHERECTOMY;  Surgeon: Maeola Harman, MD;  Location: [facility] INVASIVE CV LAB;  Service: Cardiovascular;  Laterality: Right;  anterioer tibial    Current Meds  Medication Sig  . ammonium lactate (LAC-HYDRIN) 12 % lotion Apply 1 application topically 2 (two) times a day.   Marland Kitchen  aspirin EC 325 MG tablet Take 325 mg by mouth daily.  Marland Kitchen atorvastatin (LIPITOR) 40 MG tablet Take 20 mg by mouth at bedtime.   . cephALEXin (KEFLEX) 500 MG capsule Take 1 capsule (500 mg total) by mouth 4 (four) times daily.  . Cholecalciferol 2000 units TABS Take 2,000 Units by mouth daily.   . citalopram (CELEXA) 40 MG tablet Take 20 mg by mouth daily.   . clopidogrel (PLAVIX) 75 MG tablet Take 75 mg by mouth daily.  . Coenzyme Q10 (COQ-10) 200 MG CAPS Take 200 mg by mouth daily.  . fluorouracil (EFUDEX) 5 % cream Apply 1 application topically 2 (two) times daily as needed (cancerous flares).   . furosemide (LASIX) 40 MG tablet Take 40 mg by mouth daily.  Marland Kitchen gabapentin (NEURONTIN) 100 MG capsule Take 200 mg by mouth at bedtime.  Marland Kitchen glipiZIDE (GLUCOTROL) 10 MG tablet Take 20 mg by mouth 2 (two) times a day.   Chilton Si Tea, Camellia sinensis, (GREEN TEA EXTRACT PO) Take 2 tablets by mouth 2 (two) times a day.  Marland Kitchen HYDROPHILIC EX Apply 1 application topically 2 (two) times a day.   . ibuprofen (ADVIL) 200 MG tablet Take 400 mg by mouth every 4 (four) hours as needed for headache or moderate pain.  . metFORMIN (GLUCOPHAGE) 850 MG tablet Take 850 mg by mouth 3 (three) times daily.   . metoprolol tartrate (LOPRESSOR) 50 MG tablet Take 50 mg by mouth 2 (two) times daily.  . Multiple Vitamin (MULTIVITAMIN WITH MINERALS) TABS tablet Take 1 tablet by mouth 2 (two) times a day.  . nitroGLYCERIN (NITROSTAT) 0.4 MG SL tablet Place 0.4 mg under the tongue every 5 (five) minutes as needed for chest pain.  Marland Kitchen OVER THE COUNTER MEDICATION Take 1 tablet by mouth daily. Neuriva otc supplement  . OVER THE COUNTER MEDICATION Take 2 tablets by mouth at bedtime. Olly Sleep  . OVER THE COUNTER MEDICATION Take 1 tablet by mouth 3 (three) times daily with meals. Sugar Blockers  . Semaglutide, 1 MG/DOSE, (OZEMPIC, 1 MG/DOSE,) 2 MG/1.5ML SOPN Inject 1 mg into the skin every Monday.  . traZODone (DESYREL) 50 MG tablet Take 25 mg  by mouth at bedtime.     12 system ROS was negative unless otherwise noted in HPI   Observations/Objective:  ABI Findings (12-07-19): +---------+------------------+-----+----------+--------+ Right    Rt Pressure (mmHg)IndexWaveform  Comment  +---------+------------------+-----+----------+--------+ Brachial 255                                       +---------+------------------+-----+----------+--------+ PTA      255               1.00 biphasic           +---------+------------------+-----+----------+--------+ DP       203               0.80 monophasic         +---------+------------------+-----+----------+--------+ Karl Luke  0.33                    +---------+------------------+-----+----------+--------+  +---------+------------------+-----+----------+-------+ Left     Lt Pressure (mmHg)IndexWaveform  Comment +---------+------------------+-----+----------+-------+ Brachial 255                                      +---------+------------------+-----+----------+-------+ PTA      255               1.00 monophasic        +---------+------------------+-----+----------+-------+ DP       197               0.77 monophasic        +---------+------------------+-----+----------+-------+ Great Toe77                0.30                   +---------+------------------+-----+----------+-------+  +-------+-----------+-----------+------------+------------+ ABI/TBIToday's ABIToday's TBIPrevious ABIPrevious TBI +-------+-----------+-----------+------------+------------+ Right  [place]         [place]         [place]          [place]           +-------+-----------+-----------+------------+------------+ Left   [place]         [place]         [place]          [place]           +-------+-----------+-----------+------------+------------+ Summary: Right: Resting right ankle-brachial index indicates noncompressible right lower extremity arteries.  The right toe-brachial index is abnormal.  Left: Resting left ankle-brachial index indicates noncompressible left lower extremity arteries. The left toe-brachial index is abnormal.    Assessment and Plan: [name] ChristmasWilliam B Reesor is a 73 y.o. male whois s/p arteriogram of lower extremities on 05-29-19 by Dr. Randie Heinzain.    Findings:Aortogram and iliacs were free of flow-limiting stenosis. On the left side appears to have runoff via the anterior tibial artery. On the right no flow-limiting stenosis in the SFA down the level of the TP trunk. The anterior tibial artery is heavily diseased and does not appear to proceed on to the foot. Peroneal artery stops at the ankle. Posterior tibial artery runs to the level of the heel and is nondiseased to that level. There appears to be significant small vessel disease not amenable to percutaneous intervention.   He is also s/p right anterior tibial and posterior tibial atherectomy with balloon angioplasty of the same vesselson 03-07-18 by Dr. Randie Heinzain. This was done for right great toe 1 cm tip ulceration as well as third toe tip ulceration. Hehashealedthese wounds.  He states that the ulcers at his right 2nd and 3rd toes are improving, [facility]   His walking seems limited more by bilateral sciatica than claudication.   His atherosclerotic risk factors include DM, CAD, and treated OSA.  Fortunately he has never used tobacco.  He takes a daily statin, ASA, and Plavix.  He states that his goal is to become well enough to join the [employer].   He has lost about 20 pounds intentionally.    Follow Up Instructions:   Follow up 3 months with ABI's. I advised hm to notify [facility] if the ulcers in his toes worsen, if he develops new ulcers.  Increase walking to a total of at least 30 minutes daily in a  safe environment.  Daily seated leg exercises discussed and demonstrated.    I discussed the assessment and  treatment plan with the patient. The patient was provided an opportunity to ask questions and all were answered. The patient agreed with the plan and demonstrated an understanding of the instructions.   The patient was advised to call back or seek an in-person evaluation if the symptoms worsen or if the condition fails to improve as anticipated.  I spent 14 minutes with the patient via telephone encounter.   Gabrielle Dare Jamya Starry [facility] Office: [phone]  12/07/2019, 6:08 PM

## 2019-12-25 ENCOUNTER — Other Ambulatory Visit: Payer: Self-pay | Admitting: *Deleted

## 2019-12-25 DIAGNOSIS — I779 Disorder of arteries and arterioles, unspecified: Secondary | ICD-10-CM

## 2019-12-25 DIAGNOSIS — Z9862 Peripheral vascular angioplasty status: Secondary | ICD-10-CM

## 2021-01-23 ENCOUNTER — Other Ambulatory Visit: Payer: Self-pay

## 2021-01-23 DIAGNOSIS — I739 Peripheral vascular disease, unspecified: Secondary | ICD-10-CM

## 2021-01-24 ENCOUNTER — Encounter (HOSPITAL_COMMUNITY): Payer: No Typology Code available for payment source

## 2021-01-24 ENCOUNTER — Ambulatory Visit: Payer: No Typology Code available for payment source | Admitting: Vascular Surgery

## 2021-02-24 ENCOUNTER — Ambulatory Visit (INDEPENDENT_AMBULATORY_CARE_PROVIDER_SITE_OTHER): Payer: Medicare Other | Admitting: Vascular Surgery

## 2021-02-24 ENCOUNTER — Ambulatory Visit (HOSPITAL_COMMUNITY)
Admission: RE | Admit: 2021-02-24 | Discharge: 2021-02-24 | Disposition: A | Discharge status: Home / Self Care | Payer: Medicare Other | Source: Ambulatory Visit | Attending: Vascular Surgery | Admitting: Vascular Surgery

## 2021-02-24 ENCOUNTER — Other Ambulatory Visit: Payer: Self-pay

## 2021-02-24 ENCOUNTER — Encounter: Payer: Self-pay | Admitting: Vascular Surgery

## 2021-02-24 VITALS — BP 135/68 | HR 83 | Temp 98.1°F | Resp 20 | Ht 68.0 in | Wt 225.0 lb

## 2021-02-24 DIAGNOSIS — I739 Peripheral vascular disease, unspecified: Secondary | ICD-10-CM | POA: Insufficient documentation

## 2021-02-24 NOTE — H&P (View-Only) (Signed)
Patient ID: [name], male   DOB: 1946-05-03, 75 y.o.   MRN: [id number]  Reason for Consult: No chief complaint on file.   Referred by Jesusita Oka,*  Subjective:     HPI:  [name] is a 75 y.o. male history of a right lower extremity posterior tibial atherectomy and balloon angioplasty in March 2019 this was done for a great toe tip ulceration.  He underwent repeat angiography which demonstrated runoff via the posterior tibial on the right and anterior tibial on the left.  At that time he had no intervention undertaken.  He is now sent back for evaluation of left great toe ulcer.  He states this has been present for at least a month.  Wounds on the right foot have all healed.  He does have associated swelling of the left leg.  He does not have any fevers or chills.  He is taking aspirin, Plavix and a statin.  Past Medical History:  Diagnosis Date  . CAD, AUTOLOGOUS BYPASS GRAFT   . DIABETES, TYPE 2   . DYSPNEA   . HYPERLIPIDEMIA   . OBESITY   . OBSTRUCTIVE SLEEP APNEA   . Old myocardial infarction    Family History  Problem Relation Age of Onset  . Diabetes Mother   . Alcoholism Father    Past Surgical History:  Procedure Laterality Date  . LOWER EXTREMITY ANGIOGRAPHY N/A 03/07/2018   Procedure: LOWER EXTREMITY ANGIOGRAPHY;  Surgeon: Maeola Harman, MD;  Location: [facility] INVASIVE CV LAB;  Service: Cardiovascular;  Laterality: N/A;  . LOWER EXTREMITY ANGIOGRAPHY Bilateral 05/29/2019   Procedure: LOWER EXTREMITY ANGIOGRAPHY;  Surgeon: Maeola Harman, MD;  Location: [facility] INVASIVE CV LAB;  Service: Cardiovascular;  Laterality: Bilateral;  . PERIPHERAL VASCULAR ATHERECTOMY Right 03/07/2018   Procedure: PERIPHERAL VASCULAR ATHERECTOMY;  Surgeon: Maeola Harman, MD;  Location: [facility] INVASIVE CV LAB;  Service: Cardiovascular;  Laterality: Right;  anterioer tibial    Short Social History:  Social History   Tobacco Use  . Smoking  status: Former Smoker    Types: Pipe  . Smokeless tobacco: Never Used  Substance Use Topics  . Alcohol use: Never    Allergies  Allergen Reactions  . Ace Inhibitors     Heart attack   . Lisinopril     Heart attack    Current Outpatient Medications  Medication Sig Dispense Refill  . ammonium lactate (LAC-HYDRIN) 12 % lotion Apply 1 application topically 2 (two) times a day.     Marland Kitchen aspirin EC 325 MG tablet Take 325 mg by mouth daily.    Marland Kitchen atorvastatin (LIPITOR) 40 MG tablet Take 20 mg by mouth at bedtime.     . cephALEXin (KEFLEX) 500 MG capsule Take 1 capsule (500 mg total) by mouth 4 (four) times daily. 54 capsule 0  . Cholecalciferol 2000 units TABS Take 2,000 Units by mouth daily.     . citalopram (CELEXA) 40 MG tablet Take 20 mg by mouth daily.     . clopidogrel (PLAVIX) 75 MG tablet Take 75 mg by mouth daily.    . Coenzyme Q10 (COQ-10) 200 MG CAPS Take 200 mg by mouth daily.    . fluorouracil (EFUDEX) 5 % cream Apply 1 application topically 2 (two) times daily as needed (cancerous flares).     . furosemide (LASIX) 40 MG tablet Take 40 mg by mouth daily.    Marland Kitchen gabapentin (NEURONTIN) 100 MG capsule Take 200 mg by mouth at bedtime.    Marland Kitchen  glipiZIDE (GLUCOTROL) 10 MG tablet Take 20 mg by mouth 2 (two) times a day.     Chilton Si Tea, Camellia sinensis, (GREEN TEA EXTRACT PO) Take 2 tablets by mouth 2 (two) times a day.    Marland Kitchen HYDROPHILIC EX Apply 1 application topically 2 (two) times a day.     . ibuprofen (ADVIL) 200 MG tablet Take 400 mg by mouth every 4 (four) hours as needed for headache or moderate pain.    . metFORMIN (GLUCOPHAGE) 850 MG tablet Take 850 mg by mouth 3 (three) times daily.     . metoprolol tartrate (LOPRESSOR) 50 MG tablet Take 50 mg by mouth 2 (two) times daily.    . Multiple Vitamin (MULTIVITAMIN WITH MINERALS) TABS tablet Take 1 tablet by mouth 2 (two) times a day.    . nitroGLYCERIN (NITROSTAT) 0.4 MG SL tablet Place 0.4 mg under the tongue every 5 (five) minutes as  needed for chest pain.    Marland Kitchen OVER THE COUNTER MEDICATION Take 1 tablet by mouth daily. Neuriva otc supplement    . OVER THE COUNTER MEDICATION Take 2 tablets by mouth at bedtime. Olly Sleep    . OVER THE COUNTER MEDICATION Take 1 tablet by mouth 3 (three) times daily with meals. Sugar Blockers    . Semaglutide, 1 MG/DOSE, (OZEMPIC, 1 MG/DOSE,) 2 MG/1.5ML SOPN Inject 1 mg into the skin every Monday.    . traZODone (DESYREL) 50 MG tablet Take 25 mg by mouth at bedtime.      No current facility-administered medications for this visit.    Review of Systems  Constitutional:  Constitutional negative. HENT: HENT negative.  Eyes: Eyes negative.  Respiratory: Respiratory negative.  Cardiovascular: Positive for leg swelling.  GI: Gastrointestinal negative.  Musculoskeletal: Musculoskeletal negative.  Skin: Positive for wound.  Neurological: Neurological negative. Hematologic: Hematologic/lymphatic negative.  Psychiatric: Psychiatric negative.        Objective:  Objective   Vitals:   02/24/21 1149  BP: 135/68  Pulse: 83  Resp: 20  Temp: 98.1 F (36.7 C)  SpO2: 96%    Physical Exam HENT:     Head: Normocephalic.     Nose:     Comments: Wearing a mask Eyes:     Pupils: Pupils are equal, round, and reactive to light.  Cardiovascular:     Pulses:          Radial pulses are 2+ on the right side and 2+ on the left side.       Popliteal pulses are 0 on the right side and 0 on the left side.       Dorsalis pedis pulses are 0 on the right side and 0 on the left side.       Posterior tibial pulses are 0 on the right side and 0 on the left side.  Pulmonary:     Effort: Pulmonary effort is normal.  Abdominal:     General: Abdomen is flat.     Palpations: Abdomen is soft.  Musculoskeletal:        General: Normal range of motion.     Right lower leg: No edema.     Left lower leg: Edema present.  Skin:    Capillary Refill: Capillary refill takes 2 to 3 seconds.     Comments: Left  fourth toe has dry gangrenous eschar on the lateral and inferior aspect  Neurological:     General: No focal deficit present.     Mental Status: He is alert.  Psychiatric:        Mood and Affect: Mood normal.        Behavior: Behavior normal.        Thought Content: Thought content normal.        Judgment: Judgment normal.     Data: ABI Findings:  +---------+------------------+-----+--------+--------+  Right  Rt Pressure (mmHg)IndexWaveformComment   +---------+------------------+-----+--------+--------+  Brachial 255                     +---------+------------------+-----+--------+--------+  ATA   255        1.00 biphasic      +---------+------------------+-----+--------+--------+  PTA   255        1.00 biphasic      +---------+------------------+-----+--------+--------+  Great Toe81        0.32 Abnormal      +---------+------------------+-----+--------+--------+   +---------+------------------+-----+--------+-------+  Left   Lt Pressure (mmHg)IndexWaveformComment  +---------+------------------+-----+--------+-------+  Brachial 255                     +---------+------------------+-----+--------+-------+  ATA   255        1.00 biphasic      +---------+------------------+-----+--------+-------+  PTA   255        1.00 biphasic      +---------+------------------+-----+--------+-------+  Great Toe44        0.17 Abnormal      +---------+------------------+-----+--------+-------+   +-------+-----------+-----------+------------+------------+  ABI/TBIToday's ABIToday's TBIPrevious ABIPrevious TBI  +-------+-----------+-----------+------------+------------+  Right       0.32          0.33      +-------+-----------+-----------+------------+------------+  Left        0.3           0.17      +-------+-----------+-----------+------------+------------+      Assessment/Plan:     75 year old male with previous right lower extremity endovascular invention he has healed the wound there.  He now has a left fourth toe ulceration he is indicated for angiography with possible invention.  We discussed the risk benefits and alternatives he demonstrates good understanding we will get him scheduled on Monday in the near future.  He can continue aspirin and Plavix throughout the procedure.     Maeola Harman MD [facility]

## 2021-02-24 NOTE — Progress Notes (Signed)
Patient ID: [name], male   DOB: 1946-05-03, 75 y.o.   MRN: [id number]  Reason for Consult: No chief complaint on file.   Referred by Jesusita Oka,*  Subjective:     HPI:  [name] is a 75 y.o. male history of a right lower extremity posterior tibial atherectomy and balloon angioplasty in March 2019 this was done for a great toe tip ulceration.  He underwent repeat angiography which demonstrated runoff via the posterior tibial on the right and anterior tibial on the left.  At that time he had no intervention undertaken.  He is now sent back for evaluation of left great toe ulcer.  He states this has been present for at least a month.  Wounds on the right foot have all healed.  He does have associated swelling of the left leg.  He does not have any fevers or chills.  He is taking aspirin, Plavix and a statin.  Past Medical History:  Diagnosis Date  . CAD, AUTOLOGOUS BYPASS GRAFT   . DIABETES, TYPE 2   . DYSPNEA   . HYPERLIPIDEMIA   . OBESITY   . OBSTRUCTIVE SLEEP APNEA   . Old myocardial infarction    Family History  Problem Relation Age of Onset  . Diabetes Mother   . Alcoholism Father    Past Surgical History:  Procedure Laterality Date  . LOWER EXTREMITY ANGIOGRAPHY N/A 03/07/2018   Procedure: LOWER EXTREMITY ANGIOGRAPHY;  Surgeon: Maeola Harman, MD;  Location: [facility] INVASIVE CV LAB;  Service: Cardiovascular;  Laterality: N/A;  . LOWER EXTREMITY ANGIOGRAPHY Bilateral 05/29/2019   Procedure: LOWER EXTREMITY ANGIOGRAPHY;  Surgeon: Maeola Harman, MD;  Location: [facility] INVASIVE CV LAB;  Service: Cardiovascular;  Laterality: Bilateral;  . PERIPHERAL VASCULAR ATHERECTOMY Right 03/07/2018   Procedure: PERIPHERAL VASCULAR ATHERECTOMY;  Surgeon: Maeola Harman, MD;  Location: [facility] INVASIVE CV LAB;  Service: Cardiovascular;  Laterality: Right;  anterioer tibial    Short Social History:  Social History   Tobacco Use  . Smoking  status: Former Smoker    Types: Pipe  . Smokeless tobacco: Never Used  Substance Use Topics  . Alcohol use: Never    Allergies  Allergen Reactions  . Ace Inhibitors     Heart attack   . Lisinopril     Heart attack    Current Outpatient Medications  Medication Sig Dispense Refill  . ammonium lactate (LAC-HYDRIN) 12 % lotion Apply 1 application topically 2 (two) times a day.     Marland Kitchen aspirin EC 325 MG tablet Take 325 mg by mouth daily.    Marland Kitchen atorvastatin (LIPITOR) 40 MG tablet Take 20 mg by mouth at bedtime.     . cephALEXin (KEFLEX) 500 MG capsule Take 1 capsule (500 mg total) by mouth 4 (four) times daily. 54 capsule 0  . Cholecalciferol 2000 units TABS Take 2,000 Units by mouth daily.     . citalopram (CELEXA) 40 MG tablet Take 20 mg by mouth daily.     . clopidogrel (PLAVIX) 75 MG tablet Take 75 mg by mouth daily.    . Coenzyme Q10 (COQ-10) 200 MG CAPS Take 200 mg by mouth daily.    . fluorouracil (EFUDEX) 5 % cream Apply 1 application topically 2 (two) times daily as needed (cancerous flares).     . furosemide (LASIX) 40 MG tablet Take 40 mg by mouth daily.    Marland Kitchen gabapentin (NEURONTIN) 100 MG capsule Take 200 mg by mouth at bedtime.    Marland Kitchen  glipiZIDE (GLUCOTROL) 10 MG tablet Take 20 mg by mouth 2 (two) times a day.     Chilton Si Tea, Camellia sinensis, (GREEN TEA EXTRACT PO) Take 2 tablets by mouth 2 (two) times a day.    Marland Kitchen HYDROPHILIC EX Apply 1 application topically 2 (two) times a day.     . ibuprofen (ADVIL) 200 MG tablet Take 400 mg by mouth every 4 (four) hours as needed for headache or moderate pain.    . metFORMIN (GLUCOPHAGE) 850 MG tablet Take 850 mg by mouth 3 (three) times daily.     . metoprolol tartrate (LOPRESSOR) 50 MG tablet Take 50 mg by mouth 2 (two) times daily.    . Multiple Vitamin (MULTIVITAMIN WITH MINERALS) TABS tablet Take 1 tablet by mouth 2 (two) times a day.    . nitroGLYCERIN (NITROSTAT) 0.4 MG SL tablet Place 0.4 mg under the tongue every 5 (five) minutes as  needed for chest pain.    Marland Kitchen OVER THE COUNTER MEDICATION Take 1 tablet by mouth daily. Neuriva otc supplement    . OVER THE COUNTER MEDICATION Take 2 tablets by mouth at bedtime. Olly Sleep    . OVER THE COUNTER MEDICATION Take 1 tablet by mouth 3 (three) times daily with meals. Sugar Blockers    . Semaglutide, 1 MG/DOSE, (OZEMPIC, 1 MG/DOSE,) 2 MG/1.5ML SOPN Inject 1 mg into the skin every Monday.    . traZODone (DESYREL) 50 MG tablet Take 25 mg by mouth at bedtime.      No current facility-administered medications for this visit.    Review of Systems  Constitutional:  Constitutional negative. HENT: HENT negative.  Eyes: Eyes negative.  Respiratory: Respiratory negative.  Cardiovascular: Positive for leg swelling.  GI: Gastrointestinal negative.  Musculoskeletal: Musculoskeletal negative.  Skin: Positive for wound.  Neurological: Neurological negative. Hematologic: Hematologic/lymphatic negative.  Psychiatric: Psychiatric negative.        Objective:  Objective   Vitals:   02/24/21 1149  BP: 135/68  Pulse: 83  Resp: 20  Temp: 98.1 F (36.7 C)  SpO2: 96%    Physical Exam HENT:     Head: Normocephalic.     Nose:     Comments: Wearing a mask Eyes:     Pupils: Pupils are equal, round, and reactive to light.  Cardiovascular:     Pulses:          Radial pulses are 2+ on the right side and 2+ on the left side.       Popliteal pulses are 0 on the right side and 0 on the left side.       Dorsalis pedis pulses are 0 on the right side and 0 on the left side.       Posterior tibial pulses are 0 on the right side and 0 on the left side.  Pulmonary:     Effort: Pulmonary effort is normal.  Abdominal:     General: Abdomen is flat.     Palpations: Abdomen is soft.  Musculoskeletal:        General: Normal range of motion.     Right lower leg: No edema.     Left lower leg: Edema present.  Skin:    Capillary Refill: Capillary refill takes 2 to 3 seconds.     Comments: Left  fourth toe has dry gangrenous eschar on the lateral and inferior aspect  Neurological:     General: No focal deficit present.     Mental Status: He is alert.  Psychiatric:        Mood and Affect: Mood normal.        Behavior: Behavior normal.        Thought Content: Thought content normal.        Judgment: Judgment normal.     Data: ABI Findings:  +---------+------------------+-----+--------+--------+  Right  Rt Pressure (mmHg)IndexWaveformComment   +---------+------------------+-----+--------+--------+  Brachial 255                     +---------+------------------+-----+--------+--------+  ATA   255        1.00 biphasic      +---------+------------------+-----+--------+--------+  PTA   255        1.00 biphasic      +---------+------------------+-----+--------+--------+  Great Toe81        0.32 Abnormal      +---------+------------------+-----+--------+--------+   +---------+------------------+-----+--------+-------+  Left   Lt Pressure (mmHg)IndexWaveformComment  +---------+------------------+-----+--------+-------+  Brachial 255                     +---------+------------------+-----+--------+-------+  ATA   255        1.00 biphasic      +---------+------------------+-----+--------+-------+  PTA   255        1.00 biphasic      +---------+------------------+-----+--------+-------+  Great Toe44        0.17 Abnormal      +---------+------------------+-----+--------+-------+   +-------+-----------+-----------+------------+------------+  ABI/TBIToday's ABIToday's TBIPrevious ABIPrevious TBI  +-------+-----------+-----------+------------+------------+  Right       0.32          0.33      +-------+-----------+-----------+------------+------------+  Left        0.3           0.17      +-------+-----------+-----------+------------+------------+      Assessment/Plan:     74-year-old male with previous right lower extremity endovascular invention he has healed the wound there.  He now has a left fourth toe ulceration he is indicated for angiography with possible invention.  We discussed the risk benefits and alternatives he demonstrates good understanding we will get him scheduled on Monday in the near future.  He can continue aspirin and Plavix throughout the procedure.     Mitchel Delduca Christopher Evany Schecter MD [facility] [place]  

## 2021-03-08 ENCOUNTER — Other Ambulatory Visit (HOSPITAL_COMMUNITY)
Admission: RE | Admit: 2021-03-08 | Discharge: 2021-03-08 | Disposition: A | Discharge status: Home / Self Care | Payer: Medicare Other | Source: Ambulatory Visit | Attending: Vascular Surgery | Admitting: Vascular Surgery

## 2021-03-08 DIAGNOSIS — Z01812 Encounter for preprocedural laboratory examination: Secondary | ICD-10-CM | POA: Insufficient documentation

## 2021-03-08 DIAGNOSIS — Z20822 Contact with and (suspected) exposure to covid-19: Secondary | ICD-10-CM | POA: Diagnosis not present

## 2021-03-08 LAB — SARS CORONAVIRUS 2 (TAT 6-24 HRS): SARS Coronavirus 2: NEGATIVE

## 2021-03-10 ENCOUNTER — Other Ambulatory Visit: Payer: Self-pay

## 2021-03-10 ENCOUNTER — Ambulatory Visit (HOSPITAL_COMMUNITY)
Admission: RE | Admit: 2021-03-10 | Discharge: 2021-03-10 | Disposition: A | Discharge status: Home / Self Care | Payer: No Typology Code available for payment source | Attending: Vascular Surgery | Admitting: Vascular Surgery

## 2021-03-10 ENCOUNTER — Encounter (HOSPITAL_COMMUNITY)
Admission: RE | Disposition: A | Discharge status: Home / Self Care | Payer: Self-pay | Source: Home / Self Care | Attending: Vascular Surgery

## 2021-03-10 DIAGNOSIS — Z7984 Long term (current) use of oral hypoglycemic drugs: Secondary | ICD-10-CM | POA: Diagnosis not present

## 2021-03-10 DIAGNOSIS — E11621 Type 2 diabetes mellitus with foot ulcer: Secondary | ICD-10-CM | POA: Insufficient documentation

## 2021-03-10 DIAGNOSIS — Z833 Family history of diabetes mellitus: Secondary | ICD-10-CM | POA: Insufficient documentation

## 2021-03-10 DIAGNOSIS — I70262 Atherosclerosis of native arteries of extremities with gangrene, left leg: Secondary | ICD-10-CM | POA: Insufficient documentation

## 2021-03-10 DIAGNOSIS — Z794 Long term (current) use of insulin: Secondary | ICD-10-CM | POA: Insufficient documentation

## 2021-03-10 DIAGNOSIS — E1152 Type 2 diabetes mellitus with diabetic peripheral angiopathy with gangrene: Secondary | ICD-10-CM | POA: Diagnosis not present

## 2021-03-10 DIAGNOSIS — Z87891 Personal history of nicotine dependence: Secondary | ICD-10-CM | POA: Diagnosis not present

## 2021-03-10 DIAGNOSIS — Z7982 Long term (current) use of aspirin: Secondary | ICD-10-CM | POA: Insufficient documentation

## 2021-03-10 DIAGNOSIS — Z79899 Other long term (current) drug therapy: Secondary | ICD-10-CM | POA: Diagnosis not present

## 2021-03-10 DIAGNOSIS — L97529 Non-pressure chronic ulcer of other part of left foot with unspecified severity: Secondary | ICD-10-CM | POA: Diagnosis not present

## 2021-03-10 DIAGNOSIS — Z7902 Long term (current) use of antithrombotics/antiplatelets: Secondary | ICD-10-CM | POA: Diagnosis not present

## 2021-03-10 DIAGNOSIS — I96 Gangrene, not elsewhere classified: Secondary | ICD-10-CM

## 2021-03-10 HISTORY — PX: ABDOMINAL AORTOGRAM W/LOWER EXTREMITY: CATH118223

## 2021-03-10 LAB — POCT I-STAT, CHEM 8
BUN: 20 mg/dL (ref 8–23)
Calcium, Ion: 1.24 mmol/L (ref 1.15–1.40)
Chloride: 102 mmol/L (ref 98–111)
Creatinine, Ser: 1.2 mg/dL (ref 0.61–1.24)
Glucose, Bld: 104 mg/dL — ABNORMAL HIGH (ref 70–99)
HCT: 48 % (ref 39.0–52.0)
Hemoglobin: 16.3 g/dL (ref 13.0–17.0)
Potassium: 4.8 mmol/L (ref 3.5–5.1)
Sodium: 141 mmol/L (ref 135–145)
TCO2: 28 mmol/L (ref 22–32)

## 2021-03-10 LAB — GLUCOSE, CAPILLARY: Glucose-Capillary: 78 mg/dL (ref 70–99)

## 2021-03-10 SURGERY — ABDOMINAL AORTOGRAM W/LOWER EXTREMITY
Anesthesia: LOCAL | Laterality: Bilateral

## 2021-03-10 MED ORDER — MIDAZOLAM HCL 2 MG/2ML IJ SOLN
INTRAMUSCULAR | Status: AC
  Filled 2021-03-10: qty 2

## 2021-03-10 MED ORDER — HYDRALAZINE HCL 20 MG/ML IJ SOLN: 5.0000 mg | INTRAMUSCULAR | Status: DC | PRN

## 2021-03-10 MED ORDER — SODIUM CHLORIDE 0.9% FLUSH: 3.0000 mL | INTRAVENOUS | Status: DC | PRN

## 2021-03-10 MED ORDER — MIDAZOLAM HCL 2 MG/2ML IJ SOLN
INTRAMUSCULAR | Status: DC | PRN
  Administered 2021-03-10: 1 mg via INTRAVENOUS

## 2021-03-10 MED ORDER — IODIXANOL 320 MG/ML IV SOLN
INTRAVENOUS | Status: DC | PRN
  Administered 2021-03-10: 115 mL via INTRA_ARTERIAL

## 2021-03-10 MED ORDER — SODIUM CHLORIDE 0.9 % WEIGHT BASED INFUSION: 1.0000 mL/kg/h | INTRAVENOUS | Status: DC

## 2021-03-10 MED ORDER — SODIUM CHLORIDE 0.9% FLUSH: 3.0000 mL | Freq: Two times a day (BID) | INTRAVENOUS | Status: DC

## 2021-03-10 MED ORDER — HEPARIN (PORCINE) IN NACL 1000-0.9 UT/500ML-% IV SOLN
INTRAVENOUS | Status: DC | PRN
  Administered 2021-03-10 (×2): 500 mL

## 2021-03-10 MED ORDER — FENTANYL CITRATE (PF) 100 MCG/2ML IJ SOLN
INTRAMUSCULAR | Status: DC | PRN
  Administered 2021-03-10: 50 ug via INTRAVENOUS

## 2021-03-10 MED ORDER — OXYCODONE HCL 5 MG PO TABS: 5.0000 mg | ORAL_TABLET | ORAL | Status: DC | PRN

## 2021-03-10 MED ORDER — LIDOCAINE HCL (PF) 1 % IJ SOLN
INTRAMUSCULAR | Status: DC | PRN
  Administered 2021-03-10: 18 mL via INTRADERMAL

## 2021-03-10 MED ORDER — LABETALOL HCL 5 MG/ML IV SOLN: 10.0000 mg | INTRAVENOUS | Status: DC | PRN

## 2021-03-10 MED ORDER — MORPHINE SULFATE (PF) 2 MG/ML IV SOLN: 2.0000 mg | INTRAVENOUS | Status: DC | PRN

## 2021-03-10 MED ORDER — SODIUM CHLORIDE 0.9 % IV SOLN: INTRAVENOUS | Status: DC

## 2021-03-10 MED ORDER — SODIUM CHLORIDE 0.9 % IV SOLN: 250.0000 mL | INTRAVENOUS | Status: DC | PRN

## 2021-03-10 MED ORDER — HEPARIN (PORCINE) IN NACL 1000-0.9 UT/500ML-% IV SOLN
INTRAVENOUS | Status: AC
  Filled 2021-03-10: qty 1000

## 2021-03-10 MED ORDER — LIDOCAINE HCL (PF) 1 % IJ SOLN
INTRAMUSCULAR | Status: AC
  Filled 2021-03-10: qty 30

## 2021-03-10 MED ORDER — FENTANYL CITRATE (PF) 100 MCG/2ML IJ SOLN
INTRAMUSCULAR | Status: AC
  Filled 2021-03-10: qty 2

## 2021-03-10 MED ORDER — ACETAMINOPHEN 325 MG PO TABS: 650.0000 mg | ORAL_TABLET | ORAL | Status: DC | PRN

## 2021-03-10 MED ORDER — ONDANSETRON HCL 4 MG/2ML IJ SOLN: 4.0000 mg | Freq: Four times a day (QID) | INTRAMUSCULAR | Status: DC | PRN

## 2021-03-10 SURGICAL SUPPLY — 12 items
CATH OMNI FLUSH 5F 65CM (CATHETERS) ×2 IMPLANT
CATH QUICKCROSS SUPP .035X90CM (MICROCATHETER) ×2 IMPLANT
CLOSURE MYNX CONTROL 5F (Vascular Products) ×2 IMPLANT
GUIDEWIRE ANGLED .035X150CM (WIRE) ×2 IMPLANT
KIT MICROPUNCTURE NIT STIFF (SHEATH) ×2 IMPLANT
KIT PV (KITS) ×2 IMPLANT
SHEATH PINNACLE 5F 10CM (SHEATH) ×2 IMPLANT
SHEATH PROBE COVER 6X72 (BAG) ×2 IMPLANT
SYR MEDRAD MARK V 150ML (SYRINGE) ×2 IMPLANT
TRANSDUCER W/STOPCOCK (MISCELLANEOUS) ×2 IMPLANT
TRAY PV CATH (CUSTOM PROCEDURE TRAY) ×2 IMPLANT
WIRE BENTSON .035X145CM (WIRE) ×2 IMPLANT

## 2021-03-10 NOTE — Op Note (Signed)
    Patient name: [name] MRN: [id number] DOB: [birth year] Sex: male  03/10/2021 Pre-operative Diagnosis: Left lower extremity fourth toe ulceration Post-operative diagnosis:  Same Surgeon:  Apolinar Junes C. Randie Heinz, MD Procedure Performed: 1.  Ultrasound-guided cannulation right common femoral artery 2.  Aortogram with bilateral lower extremity runoff 3.  Selection of left popliteal artery and below-knee angiography on the left 4.  Moderate sedation with fentanyl Versed for 35 minutes 5.  Minx device closure right common femoral artery  Indications: 75 year old male with a history of right lower extremity endovascular intervention.  He healed wounds after that.  He now has left fourth toe ulceration with gangrenous changes and loss of the nail.  He is indicated for angiography with possible intervention.  Findings: The aorta and iliac segments are free of flow-limiting stenosis.  Bilateral renal arteries are patent no flow-limiting stenosis.  The right side the SFA popliteal arteries are patent.  Appears to have runoff via the posterior tibial artery in line to the foot on the right.  On the left side which is the site of interest he possibly has a low-grade stenosis in the mid anterior tibial artery after the SFA and popliteal are patent.  His posterior tibial artery is smaller however has inline flow all the way and fills the arch of the foot and no intervention was undertaken.  Peroneal artery is occluded shortly after the takeoff.   Plan will be for left fourth toe amputation.   Procedure:  The patient was identified in the holding area and taken to room 8.  The patient was then placed supine on the table and prepped and draped in the usual sterile fashion.  A time out was called.  Ultrasound was used to evaluate the right common femoral artery which is noted to be free of disease.  The area anesthetized 1% lidocaine cannulated micropuncture needle followed wire and sheath.  And images saved  the permanent record.  Bentson wire was placed followed by 5 [place] sheath.  Aortogram followed by lower extremity runoff was performed.  We then crossed the bifurcation with Glidewire and Omni catheter then placed a quick cross catheter at the level of popliteal artery and performed angiography below the knee.  No intervention was undertaken.  Cath and wire were removed.  Minx device was deployed which did appear to achieve hemostasis.  He tolerated procedure without any complication.  Contrast: 115c  Nazaria Riesen C. Randie Heinz, MD [facility] Office: [phone] Pager: [phone]

## 2021-03-10 NOTE — Discharge Instructions (Signed)
NO METFORMIN/ GLUCOPHAGE FOR 2 DAYS     Femoral Site Care  This sheet gives you information about how to care for yourself after your procedure. Your health care provider may also give you more specific instructions. If you have problems or questions, contact your health care provider. What can I expect after the procedure? After the procedure, it is common to have:  Bruising that usually fades within 1-2 weeks.  Tenderness at the site. Follow these instructions at home: Wound care  Follow instructions from your health care provider about how to take care of your insertion site. Make sure you: ? Wash your hands with soap and water before you change your bandage (dressing). If soap and water are not available, use hand sanitizer. ? Change your dressing as told by your health care provider. ? Leave stitches (sutures), skin glue, or adhesive strips in place. These skin closures may need to stay in place for 2 weeks or longer. If adhesive strip edges start to loosen and curl up, you may trim the loose edges. Do not remove adhesive strips completely unless your health care provider tells you to do that.  Do not take baths, swim, or use a hot tub until your health care provider approves.  You may shower 24-48 hours after the procedure or as told by your health care provider. ? Gently wash the site with plain soap and water. ? Pat the area dry with a clean towel. ? Do not rub the site. This may cause bleeding.  Do not apply powder or lotion to the site. Keep the site clean and dry.  Check your femoral site every day for signs of infection. Check for: ? Redness, swelling, or pain. ? Fluid or blood. ? Warmth. ? Pus or a bad smell. Activity  For the first 2-3 days after your procedure, or as long as directed: ? Avoid climbing stairs as much as possible. ? Do not squat.  Do not lift anything that is heavier than 10 lb (4.5 kg), or the limit that you are told, until your health care  provider says that it is safe.  Rest as directed. ? Avoid sitting for a long time without moving. Get up to take short walks every 1-2 hours.  Do not drive for 24 hours if you were given a medicine to help you relax (sedative). General instructions  Take over-the-counter and prescription medicines only as told by your health care provider.  Keep all follow-up visits as told by your health care provider. This is important. Contact a health care provider if you have:  A fever or chills.  You have redness, swelling, or pain around your insertion site. Get help right away if:  The catheter insertion area swells very fast.  You pass out.  You suddenly start to sweat or your skin gets clammy.  The catheter insertion area is bleeding, and the bleeding does not stop when you hold steady pressure on the area.  The area near or just beyond the catheter insertion site becomes pale, cool, tingly, or numb. These symptoms may represent a serious problem that is an emergency. Do not wait to see if the symptoms will go away. Get medical help right away. Call your local emergency services (911 in the [place] Do not drive yourself to the hospital. Summary  After the procedure, it is common to have bruising that usually fades within 1-2 weeks.  Check your femoral site every day for signs of infection.  Do not lift   anything that is heavier than 10 lb (4.5 kg), or the limit that you are told, until your health care provider says that it is safe. This information is not intended to replace advice given to you by your health care provider. Make sure you discuss any questions you have with your health care provider. Document Revised: 08/09/2020 Document Reviewed: 08/09/2020 Elsevier Patient Education  2021 [employer] Inc[employer]  

## 2021-03-10 NOTE — Interval H&P Note (Signed)
History and Physical Interval Note:  03/10/2021 8:40 AM  [name]  has presented today for surgery, with the diagnosis of pvd.  The various methods of treatment have been discussed with the patient and family. After consideration of risks, benefits and other options for treatment, the patient has consented to  Procedure(s): ABDOMINAL AORTOGRAM W/LOWER EXTREMITY (N/A) as a surgical intervention.  The patient's history has been reviewed, patient examined, no change in status, stable for surgery.  I have reviewed the patient's chart and labs.  Questions were answered to the patient's satisfaction.     Lemar Livings

## 2021-03-11 ENCOUNTER — Other Ambulatory Visit: Payer: Self-pay

## 2021-03-11 ENCOUNTER — Encounter (HOSPITAL_COMMUNITY): Payer: Self-pay | Admitting: Vascular Surgery

## 2021-03-15 ENCOUNTER — Encounter (HOSPITAL_COMMUNITY): Payer: Self-pay | Admitting: Vascular Surgery

## 2021-03-15 NOTE — Progress Notes (Addendum)
Gave pt. Instructions for surgery. Pt. To get covid test 3/38/22, informed him to quarantine afterwards. Fasting blood sugar 125-160, last A1C -not sure,hasn't received results. Instructed to check blood sugar every 2 hrs. When he wakes up the morning of surgery,and if less than 70, drink 1/2 cup of juice, recheck blood sugar and if still < 70 to call Short Stay. Number given to pt. Pt. Last does plavix 03/12/21.

## 2021-03-17 ENCOUNTER — Other Ambulatory Visit (HOSPITAL_COMMUNITY)
Admission: RE | Admit: 2021-03-17 | Discharge: 2021-03-17 | Disposition: A | Discharge status: Home / Self Care | Payer: Medicare Other | Source: Ambulatory Visit | Attending: Vascular Surgery | Admitting: Vascular Surgery

## 2021-03-17 DIAGNOSIS — Z01812 Encounter for preprocedural laboratory examination: Secondary | ICD-10-CM | POA: Diagnosis present

## 2021-03-17 DIAGNOSIS — Z20822 Contact with and (suspected) exposure to covid-19: Secondary | ICD-10-CM | POA: Diagnosis not present

## 2021-03-17 LAB — SARS CORONAVIRUS 2 (TAT 6-24 HRS): SARS Coronavirus 2: NEGATIVE

## 2021-03-17 NOTE — Progress Notes (Signed)
Anesthesia Chart Review: [name]   Case: [id number] Date/Time: 03/18/21 0905   Procedure: LEFT FOURTH TOE AMPUTATION (Left )   Anesthesia type: Choice   Pre-op diagnosis: LEFT TOE ULCER   Location: [facility] ROOM 09 / [facility] OR   Surgeons: Maeola Harman, MD      DISCUSSION: Patient is a 75 year old male scheduled for the above procedure.  Patient with left fourth toe ulceration with gangrenous changes in loss of nail. Left 4th toe amputation recommended following 03/10/21 aortogram with BLE runnoff: Findings: The aorta and iliac segments are free of flow-limiting stenosis.  Bilateral renal arteries are patent no flow-limiting stenosis.  The right side the SFA popliteal arteries are patent.  Appears to have runoff via the posterior tibial artery in line to the foot on the right.  On the left side which is the site of interest he possibly has a low-grade stenosis in the mid anterior tibial artery after the SFA and popliteal are patent.  His posterior tibial artery is smaller however has inline flow all the way and fills the arch of the foot and no intervention was undertaken.  Peroneal artery is occluded shortly after the takeoff.  Other history includes former smoker, DM2, HLD, CAD (MI, s/p CABG 2008) CVA (2005), PAD (s/p CSI atherectomy right AT/PT, balloon angioplasty right AT/TP trunk, PT 03/07/18), exertional dyspnea, OSA, obesity.  Patient with CABG in 2008. He received care through the [facility]. Limited records can be viewed in Care Everywhere, but do not include any cardiac studies. He has an gangrenous left 4th toes that needs amputating. Posted for Choice anesthesia. Anesthesia team will have to evaluate on the day of surgery. Last Plavix 03/12/2021.  VS:  BP Readings from Last 3 Encounters:  03/10/21 (!) 130/46  02/24/21 135/68  08/11/19 (!) 122/58   Pulse Readings from Last 3 Encounters:  03/10/21 79  02/24/21 83  08/11/19 80    PROVIDERS: Patient, No Pcp Per. He receives  care through the [facility].   LABS: For day of surgery as indicated. ISTAT 03/10/21 showed Cr 1.20, H/H 16.3/48.0. A1c 7.4% 02/27/21 ([facility])   EKG: EKG 03/10/21: Normal sinus rhythm Anteroseptal infarct , age undetermined Abnormal ECG No significant change since last tracing Confirmed by Nanetta Batty [phone]) on 03/10/2021 8:21:32 PM   CV: Currently only stress test from 2002 and echo from 2005 available for review in [facility] and these were both prior to 2008 CABG.  Echo 10/10/04: SUMMARY  - Overall left ventricular systolic function was at the lower     limits of normal. Left ventricular ejection fraction was     estimated , range being 50 % to 55 %.. There was possible     hypokinesis of the periapical wall.  - There was mild mitral valvular regurgitation.    Past Medical History:  Diagnosis Date  . CAD, AUTOLOGOUS BYPASS GRAFT   . Depression   . DIABETES, TYPE 2   . DYSPNEA    on exertion  . HYPERLIPIDEMIA   . OBESITY   . OBSTRUCTIVE SLEEP APNEA    cpap  . Old myocardial infarction 2005  . Peripheral vascular disease (HCC)   . Stroke [facility])    light stroke    Past Surgical History:  Procedure Laterality Date  . ABDOMINAL AORTOGRAM W/LOWER EXTREMITY Bilateral 03/10/2021   Procedure: ABDOMINAL AORTOGRAM W/LOWER EXTREMITY;  Surgeon: Maeola Harman, MD;  Location: [facility] INVASIVE CV LAB;  Service: Cardiovascular;  Laterality: Bilateral;  . APPENDECTOMY  1965  . CORONARY ARTERY BYPASS GRAFT  2008   at [place]  . cyst remove on lelt leg,posterior    . LOWER EXTREMITY ANGIOGRAPHY N/A 03/07/2018   Procedure: LOWER EXTREMITY ANGIOGRAPHY;  Surgeon: Maeola Harman, MD;  Location: [facility] INVASIVE CV LAB;  Service: Cardiovascular;  Laterality: N/A;  . LOWER EXTREMITY ANGIOGRAPHY Bilateral 05/29/2019   Procedure: LOWER EXTREMITY ANGIOGRAPHY;  Surgeon: Maeola Harman, MD;  Location: [facility] INVASIVE CV LAB;  Service: Cardiovascular;  Laterality:  Bilateral;  . PERIPHERAL VASCULAR ATHERECTOMY Right 03/07/2018   Procedure: PERIPHERAL VASCULAR ATHERECTOMY;  Surgeon: Maeola Harman, MD;  Location: [facility] INVASIVE CV LAB;  Service: Cardiovascular;  Laterality: Right;  anterioer tibial  . VASECTOMY      MEDICATIONS: No current facility-administered medications for this encounter.   Marland Kitchen acetaminophen (TYLENOL) 500 MG tablet  . aspirin EC 325 MG tablet  . atorvastatin (LIPITOR) 40 MG tablet  . Charcoal Activated (ACTIVATED CHARCOAL PO)  . Cholecalciferol 2000 units TABS  . citalopram (CELEXA) 40 MG tablet  . clopidogrel (PLAVIX) 75 MG tablet  . Coenzyme Q10 (COQ-10) 200 MG CAPS  . fluorouracil (EFUDEX) 5 % cream  . furosemide (LASIX) 40 MG tablet  . Green Tea, Camellia sinensis, (GREEN TEA EXTRACT PO)  . insulin aspart protamine- aspart (NOVOLOG MIX 70/30) (70-30) 100 UNIT/ML injection  . Melatonin-Theanine (MELATONIN FORTE/L-THEANINE PO)  . metFORMIN (GLUCOPHAGE) 1000 MG tablet  . metoprolol tartrate (LOPRESSOR) 50 MG tablet  . Misc Natural Products (NEURIVA) CAPS  . Multiple Vitamin (MULTIVITAMIN WITH MINERALS) TABS tablet  . nitroGLYCERIN (NITROSTAT) 0.4 MG SL tablet  . OVER THE COUNTER MEDICATION  . Resveratrol 250 MG CAPS  . Semaglutide, 1 MG/DOSE, (OZEMPIC, 1 MG/DOSE,) 4 MG/3ML SOPN  . traZODone (DESYREL) 50 MG tablet     Shonna Chock, PA-C Surgical Short Stay/Anesthesiology [facility] Phone [phone] [facility] Phone [phone] 03/17/2021 3:10 PM

## 2021-03-17 NOTE — Anesthesia Preprocedure Evaluation (Addendum)
Anesthesia Evaluation  Patient identified by MRN, date of birth, ID band Patient awake    Reviewed: Allergy & Precautions, NPO status , Patient's Chart, lab work & pertinent test results  Airway Mallampati: II  TM Distance: >3 FB Neck ROM: Full    Dental  (+) Dental Advisory Given   Pulmonary shortness of breath, sleep apnea , former smoker,    breath sounds clear to auscultation + decreased breath sounds      Cardiovascular hypertension, Pt. on home beta blockers and Pt. on medications + CAD, + Past MI, + CABG and + Peripheral Vascular Disease   Rhythm:Regular Rate:Normal     Neuro/Psych PSYCHIATRIC DISORDERS Depression CVA    GI/Hepatic negative GI ROS, Neg liver ROS,   Endo/Other  diabetes, Type 2  Renal/GU negative Renal ROS     Musculoskeletal negative musculoskeletal ROS (+)   Abdominal (+) + obese,   Peds  Hematology negative hematology ROS (+)   Anesthesia Other Findings   Reproductive/Obstetrics                           Anesthesia Physical Anesthesia Plan  ASA: III  Anesthesia Plan: Regional   Post-op Pain Management:    Induction: Intravenous  PONV Risk Score and Plan: 1 and Ondansetron, Treatment may vary due to age or medical condition, Propofol infusion and TIVA  Airway Management Planned:   Additional Equipment: None  Intra-op Plan:   Post-operative Plan:   Informed Consent: I have reviewed the patients History and Physical, chart, labs and discussed the procedure including the risks, benefits and alternatives for the proposed anesthesia with the patient or authorized representative who has indicated his/her understanding and acceptance.     Dental advisory given  Plan Discussed with: CRNA  Anesthesia Plan Comments: (PAT note written 03/17/2021 by Shonna Chock, PA-C. )     Anesthesia Quick Evaluation

## 2021-03-18 ENCOUNTER — Ambulatory Visit (HOSPITAL_COMMUNITY): Payer: No Typology Code available for payment source | Admitting: Vascular Surgery

## 2021-03-18 ENCOUNTER — Ambulatory Visit (HOSPITAL_COMMUNITY)
Admission: RE | Admit: 2021-03-18 | Discharge: 2021-03-18 | Disposition: A | Discharge status: Home / Self Care | Payer: No Typology Code available for payment source | Attending: Vascular Surgery | Admitting: Vascular Surgery

## 2021-03-18 ENCOUNTER — Encounter (HOSPITAL_COMMUNITY): Payer: Self-pay | Admitting: Vascular Surgery

## 2021-03-18 ENCOUNTER — Encounter (HOSPITAL_COMMUNITY)
Admission: RE | Disposition: A | Discharge status: Home / Self Care | Payer: Self-pay | Source: Home / Self Care | Attending: Vascular Surgery

## 2021-03-18 ENCOUNTER — Other Ambulatory Visit: Payer: Self-pay

## 2021-03-18 DIAGNOSIS — E1151 Type 2 diabetes mellitus with diabetic peripheral angiopathy without gangrene: Secondary | ICD-10-CM | POA: Insufficient documentation

## 2021-03-18 DIAGNOSIS — I2510 Atherosclerotic heart disease of native coronary artery without angina pectoris: Secondary | ICD-10-CM | POA: Diagnosis not present

## 2021-03-18 DIAGNOSIS — L97528 Non-pressure chronic ulcer of other part of left foot with other specified severity: Secondary | ICD-10-CM | POA: Diagnosis not present

## 2021-03-18 DIAGNOSIS — Z7984 Long term (current) use of oral hypoglycemic drugs: Secondary | ICD-10-CM | POA: Insufficient documentation

## 2021-03-18 DIAGNOSIS — Z79899 Other long term (current) drug therapy: Secondary | ICD-10-CM | POA: Insufficient documentation

## 2021-03-18 DIAGNOSIS — Z8673 Personal history of transient ischemic attack (TIA), and cerebral infarction without residual deficits: Secondary | ICD-10-CM | POA: Diagnosis not present

## 2021-03-18 DIAGNOSIS — Z791 Long term (current) use of non-steroidal anti-inflammatories (NSAID): Secondary | ICD-10-CM | POA: Insufficient documentation

## 2021-03-18 DIAGNOSIS — E11621 Type 2 diabetes mellitus with foot ulcer: Secondary | ICD-10-CM | POA: Insufficient documentation

## 2021-03-18 DIAGNOSIS — I739 Peripheral vascular disease, unspecified: Secondary | ICD-10-CM

## 2021-03-18 DIAGNOSIS — Z7982 Long term (current) use of aspirin: Secondary | ICD-10-CM | POA: Diagnosis not present

## 2021-03-18 DIAGNOSIS — Z87891 Personal history of nicotine dependence: Secondary | ICD-10-CM | POA: Diagnosis not present

## 2021-03-18 DIAGNOSIS — E1152 Type 2 diabetes mellitus with diabetic peripheral angiopathy with gangrene: Secondary | ICD-10-CM | POA: Insufficient documentation

## 2021-03-18 DIAGNOSIS — I96 Gangrene, not elsewhere classified: Secondary | ICD-10-CM | POA: Diagnosis not present

## 2021-03-18 DIAGNOSIS — Z833 Family history of diabetes mellitus: Secondary | ICD-10-CM | POA: Insufficient documentation

## 2021-03-18 DIAGNOSIS — Z951 Presence of aortocoronary bypass graft: Secondary | ICD-10-CM | POA: Diagnosis not present

## 2021-03-18 DIAGNOSIS — Z7902 Long term (current) use of antithrombotics/antiplatelets: Secondary | ICD-10-CM | POA: Insufficient documentation

## 2021-03-18 DIAGNOSIS — I252 Old myocardial infarction: Secondary | ICD-10-CM | POA: Insufficient documentation

## 2021-03-18 DIAGNOSIS — Z888 Allergy status to other drugs, medicaments and biological substances status: Secondary | ICD-10-CM | POA: Insufficient documentation

## 2021-03-18 HISTORY — PX: AMPUTATION: SHX166

## 2021-03-18 HISTORY — DX: Depression, unspecified: F32.A

## 2021-03-18 HISTORY — DX: Cerebral infarction, unspecified: I63.9

## 2021-03-18 LAB — POCT I-STAT, CHEM 8
BUN: 20 mg/dL (ref 8–23)
Calcium, Ion: 1.19 mmol/L (ref 1.15–1.40)
Chloride: 98 mmol/L (ref 98–111)
Creatinine, Ser: 1.3 mg/dL — ABNORMAL HIGH (ref 0.61–1.24)
Glucose, Bld: 168 mg/dL — ABNORMAL HIGH (ref 70–99)
HCT: 44 % (ref 39.0–52.0)
Hemoglobin: 15 g/dL (ref 13.0–17.0)
Potassium: 4.6 mmol/L (ref 3.5–5.1)
Sodium: 140 mmol/L (ref 135–145)
TCO2: 32 mmol/L (ref 22–32)

## 2021-03-18 LAB — GLUCOSE, CAPILLARY
Glucose-Capillary: 147 mg/dL — ABNORMAL HIGH (ref 70–99)
Glucose-Capillary: 151 mg/dL — ABNORMAL HIGH (ref 70–99)
Glucose-Capillary: 159 mg/dL — ABNORMAL HIGH (ref 70–99)

## 2021-03-18 SURGERY — AMPUTATION DIGIT
Anesthesia: Monitor Anesthesia Care | Site: Fourth Toe | Laterality: Left

## 2021-03-18 MED ORDER — CHLORHEXIDINE GLUCONATE 0.12 % MT SOLN
15.0000 mL | Freq: Once | OROMUCOSAL | Status: AC
  Administered 2021-03-18: 15 mL via OROMUCOSAL
  Filled 2021-03-18: qty 15

## 2021-03-18 MED ORDER — LIDOCAINE 2% (20 MG/ML) 5 ML SYRINGE
INTRAMUSCULAR | Status: AC
  Filled 2021-03-18: qty 5

## 2021-03-18 MED ORDER — OXYCODONE-ACETAMINOPHEN 5-325 MG PO TABS: 1.0000 | ORAL_TABLET | Freq: Four times a day (QID) | ORAL | 0 refills | Status: DC | PRN

## 2021-03-18 MED ORDER — FENTANYL CITRATE (PF) 250 MCG/5ML IJ SOLN
INTRAMUSCULAR | Status: AC
  Filled 2021-03-18: qty 5

## 2021-03-18 MED ORDER — PROMETHAZINE HCL 25 MG/ML IJ SOLN: 6.2500 mg | INTRAMUSCULAR | Status: DC | PRN

## 2021-03-18 MED ORDER — ONDANSETRON HCL 4 MG/2ML IJ SOLN
INTRAMUSCULAR | Status: AC
  Filled 2021-03-18: qty 2

## 2021-03-18 MED ORDER — MIDAZOLAM HCL 2 MG/2ML IJ SOLN
INTRAMUSCULAR | Status: AC
  Filled 2021-03-18: qty 2

## 2021-03-18 MED ORDER — LIDOCAINE HCL (CARDIAC) PF 100 MG/5ML IV SOSY
PREFILLED_SYRINGE | INTRAVENOUS | Status: DC | PRN
  Administered 2021-03-18: 40 mg via INTRATRACHEAL

## 2021-03-18 MED ORDER — MIDAZOLAM HCL 5 MG/5ML IJ SOLN
INTRAMUSCULAR | Status: DC | PRN
  Administered 2021-03-18: 2 mg via INTRAVENOUS

## 2021-03-18 MED ORDER — FENTANYL CITRATE (PF) 100 MCG/2ML IJ SOLN
25.0000 ug | INTRAMUSCULAR | Status: DC | PRN
Start: 2021-03-18 — End: 2021-03-18

## 2021-03-18 MED ORDER — 0.9 % SODIUM CHLORIDE (POUR BTL) OPTIME
TOPICAL | Status: DC | PRN
  Administered 2021-03-18: 1000 mL

## 2021-03-18 MED ORDER — CHLORHEXIDINE GLUCONATE 4 % EX LIQD: 60.0000 mL | Freq: Once | CUTANEOUS | Status: DC

## 2021-03-18 MED ORDER — CEFAZOLIN SODIUM-DEXTROSE 2-4 GM/100ML-% IV SOLN
2.0000 g | INTRAVENOUS | Status: AC
  Administered 2021-03-18: 2 g via INTRAVENOUS
  Filled 2021-03-18: qty 100

## 2021-03-18 MED ORDER — PROPOFOL 10 MG/ML IV BOLUS
INTRAVENOUS | Status: DC | PRN
  Administered 2021-03-18: 20 mg via INTRAVENOUS
  Administered 2021-03-18: 30 mg via INTRAVENOUS
  Administered 2021-03-18 (×2): 20 mg via INTRAVENOUS

## 2021-03-18 MED ORDER — SODIUM CHLORIDE 0.9 % IV SOLN: INTRAVENOUS | Status: DC

## 2021-03-18 MED ORDER — PHENYLEPHRINE 40 MCG/ML (10ML) SYRINGE FOR IV PUSH (FOR BLOOD PRESSURE SUPPORT)
PREFILLED_SYRINGE | INTRAVENOUS | Status: AC
  Filled 2021-03-18: qty 10

## 2021-03-18 MED ORDER — ROPIVACAINE HCL 7.5 MG/ML IJ SOLN
INTRAMUSCULAR | Status: DC | PRN
  Administered 2021-03-18: 15 mL

## 2021-03-18 MED ORDER — PHENYLEPHRINE 40 MCG/ML (10ML) SYRINGE FOR IV PUSH (FOR BLOOD PRESSURE SUPPORT)
PREFILLED_SYRINGE | INTRAVENOUS | Status: DC | PRN
  Administered 2021-03-18: 40 ug via INTRAVENOUS
  Administered 2021-03-18 (×3): 80 ug via INTRAVENOUS
  Administered 2021-03-18: 40 ug via INTRAVENOUS
  Administered 2021-03-18: 80 ug via INTRAVENOUS

## 2021-03-18 MED ORDER — PROPOFOL 10 MG/ML IV BOLUS
INTRAVENOUS | Status: AC
  Filled 2021-03-18: qty 20

## 2021-03-18 MED ORDER — STERILE WATER FOR IRRIGATION IR SOLN
Status: DC | PRN
  Administered 2021-03-18: 1000 mL

## 2021-03-18 MED ORDER — LACTATED RINGERS IV SOLN: INTRAVENOUS | Status: DC

## 2021-03-18 SURGICAL SUPPLY — 24 items
BNDG ELASTIC 4X5.8 VLCR STR LF (GAUZE/BANDAGES/DRESSINGS) ×2 IMPLANT
BNDG GAUZE ELAST 4 BULKY (GAUZE/BANDAGES/DRESSINGS) ×2 IMPLANT
CANISTER SUCT 3000ML PPV (MISCELLANEOUS) ×2 IMPLANT
COVER SURGICAL LIGHT HANDLE (MISCELLANEOUS) ×2 IMPLANT
DRAPE EXTREMITY T 121X128X90 (DISPOSABLE) ×2 IMPLANT
DRAPE HALF SHEET 40X57 (DRAPES) ×2 IMPLANT
ELECT REM PT RETURN 9FT ADLT (ELECTROSURGICAL) ×2
ELECTRODE REM PT RTRN 9FT ADLT (ELECTROSURGICAL) ×1 IMPLANT
GAUZE SPONGE 4X4 12PLY STRL (GAUZE/BANDAGES/DRESSINGS) ×2 IMPLANT
GLOVE BIO SURGEON STRL SZ7.5 (GLOVE) ×2 IMPLANT
GOWN STRL REUS W/ TWL LRG LVL3 (GOWN DISPOSABLE) ×2 IMPLANT
GOWN STRL REUS W/ TWL XL LVL3 (GOWN DISPOSABLE) ×2 IMPLANT
GOWN STRL REUS W/TWL LRG LVL3 (GOWN DISPOSABLE) ×4
GOWN STRL REUS W/TWL XL LVL3 (GOWN DISPOSABLE) ×4
KIT BASIN OR (CUSTOM PROCEDURE TRAY) ×2 IMPLANT
KIT TURNOVER KIT B (KITS) ×2 IMPLANT
NS IRRIG 1000ML POUR BTL (IV SOLUTION) ×2 IMPLANT
PACK GENERAL/GYN (CUSTOM PROCEDURE TRAY) ×2 IMPLANT
PAD ARMBOARD 7.5X6 YLW CONV (MISCELLANEOUS) ×4 IMPLANT
SUT ETHILON 3 0 PS 1 (SUTURE) ×4 IMPLANT
SYR CONTROL 10ML LL (SYRINGE) IMPLANT
TOWEL GREEN STERILE (TOWEL DISPOSABLE) ×4 IMPLANT
UNDERPAD 30X36 HEAVY ABSORB (UNDERPADS AND DIAPERS) ×2 IMPLANT
WATER STERILE IRR 1000ML POUR (IV SOLUTION) ×2 IMPLANT

## 2021-03-18 NOTE — Op Note (Signed)
    Patient name: [name] MRN: [id number] DOB: May 11, 1946 Sex: male  03/18/2021 Pre-operative Diagnosis: gangrene of left 4th toe Post-operative diagnosis:  Same Surgeon:  Luanna Salk. Randie Heinz, MD Assistant: Janeal Holmes, MS-3 Procedure Performed:  Left fourth toe amputation  Indications: 75 year old male with previous history of right lower extremity endovascular intervention to heal wounds.  He subsequently presented with left fourth toe worsening ulceration with concern for arterial insufficiency.  He underwent left lower extremity angiography with no intervention.  He is now indicated for toe amputation.  Findings: Toe was removed back to the first metatarsal head.  There was adequate bleeding in the wound bed to suggest healing probability.   Procedure:  The patient was identified in the holding area and taken to the operating room where is placed supine on the operative table.  A preoperative block of been placed.  Timeout was called antibiotics were administered.  The block was checked.  Tennis racquet type incision was made around the fourth toe.  We dissected down to the bone.  The bone was removed with the toe entirely.  We used a rasp on the metatarsal head.  We irrigated the wound and obtain hemostasis.  We closed the skin with interrupted 3-0 nylon suture x3.  Sterile dressing was placed.  He tolerated procedure well without immediate complication.  All counts were correct at completion.   EBL: 10cc  Lyah Millirons C. Randie Heinz, MD [facility] Office: [phone] Pager: [phone]

## 2021-03-18 NOTE — Anesthesia Procedure Notes (Signed)
Procedure Name: MAC Date/Time: 03/18/2021 9:41 AM Performed by: Michele Rockers, CRNA Pre-anesthesia Checklist: Patient identified, Emergency Drugs available, Suction available, Timeout performed and Patient being monitored Patient Re-evaluated:Patient Re-evaluated prior to induction Oxygen Delivery Method: Simple face mask

## 2021-03-18 NOTE — Progress Notes (Signed)
Orthopedic Tech Progress Note Patient Details:  [name] Jul 17, 1946 [id number]  Ortho Devices Type of Ortho Device: Darco shoe Ortho Device/Splint Location: LLE Ortho Device/Splint Interventions: Ordered,Application,Adjustment   Post Interventions Patient Tolerated: Well Instructions Provided: Care of device,Adjustment of device,Poper ambulation with device   [name] 03/18/2021, 11:43 AM

## 2021-03-18 NOTE — H&P (Signed)
[name] is a 75 y.o. male history of a right lower extremity posterior tibial atherectomy and balloon angioplasty in March 2019 this was done for a great toe tip ulceration.  He underwent repeat angiography which demonstrated runoff via the posterior tibial on the right and anterior tibial on the left.  At that time he had no intervention undertaken.  He is now sent back for evaluation of left great toe ulcer.  He states this has been present for at least a month.  Wounds on the right foot have all healed.  He does have associated swelling of the left leg.  He does not have any fevers or chills.  He is taking aspirin, Plavix and a statin.      Past Medical History:  Diagnosis Date  . CAD, AUTOLOGOUS BYPASS GRAFT   . DIABETES, TYPE 2   . DYSPNEA   . HYPERLIPIDEMIA   . OBESITY   . OBSTRUCTIVE SLEEP APNEA   . Old myocardial infarction         Family History  Problem Relation Age of Onset  . Diabetes Mother   . Alcoholism Father         Past Surgical History:  Procedure Laterality Date  . LOWER EXTREMITY ANGIOGRAPHY N/A 03/07/2018   Procedure: LOWER EXTREMITY ANGIOGRAPHY;  Surgeon: Maeola Harman, MD;  Location: [facility] INVASIVE CV LAB;  Service: Cardiovascular;  Laterality: N/A;  . LOWER EXTREMITY ANGIOGRAPHY Bilateral 05/29/2019   Procedure: LOWER EXTREMITY ANGIOGRAPHY;  Surgeon: Maeola Harman, MD;  Location: [facility] INVASIVE CV LAB;  Service: Cardiovascular;  Laterality: Bilateral;  . PERIPHERAL VASCULAR ATHERECTOMY Right 03/07/2018   Procedure: PERIPHERAL VASCULAR ATHERECTOMY;  Surgeon: Maeola Harman, MD;  Location: [facility] INVASIVE CV LAB;  Service: Cardiovascular;  Laterality: Right;  anterioer tibial    Short Social History:  Social History        Tobacco Use  . Smoking status: Former Smoker    Types: Pipe  . Smokeless tobacco: Never Used  Substance Use Topics  . Alcohol use: Never         Allergies  Allergen  Reactions  . Ace Inhibitors     Heart attack   . Lisinopril     Heart attack          Current Outpatient Medications  Medication Sig Dispense Refill  . ammonium lactate (LAC-HYDRIN) 12 % lotion Apply 1 application topically 2 (two) times a day.     Marland Kitchen aspirin EC 325 MG tablet Take 325 mg by mouth daily.    Marland Kitchen atorvastatin (LIPITOR) 40 MG tablet Take 20 mg by mouth at bedtime.     . cephALEXin (KEFLEX) 500 MG capsule Take 1 capsule (500 mg total) by mouth 4 (four) times daily. 54 capsule 0  . Cholecalciferol 2000 units TABS Take 2,000 Units by mouth daily.     . citalopram (CELEXA) 40 MG tablet Take 20 mg by mouth daily.     . clopidogrel (PLAVIX) 75 MG tablet Take 75 mg by mouth daily.    . Coenzyme Q10 (COQ-10) 200 MG CAPS Take 200 mg by mouth daily.    . fluorouracil (EFUDEX) 5 % cream Apply 1 application topically 2 (two) times daily as needed (cancerous flares).     . furosemide (LASIX) 40 MG tablet Take 40 mg by mouth daily.    Marland Kitchen gabapentin (NEURONTIN) 100 MG capsule Take 200 mg by mouth at bedtime.    Marland Kitchen glipiZIDE (GLUCOTROL) 10 MG tablet  Take 20 mg by mouth 2 (two) times a day.     Chilton Si Tea, Camellia sinensis, (GREEN TEA EXTRACT PO) Take 2 tablets by mouth 2 (two) times a day.    Marland Kitchen HYDROPHILIC EX Apply 1 application topically 2 (two) times a day.     . ibuprofen (ADVIL) 200 MG tablet Take 400 mg by mouth every 4 (four) hours as needed for headache or moderate pain.    . metFORMIN (GLUCOPHAGE) 850 MG tablet Take 850 mg by mouth 3 (three) times daily.     . metoprolol tartrate (LOPRESSOR) 50 MG tablet Take 50 mg by mouth 2 (two) times daily.    . Multiple Vitamin (MULTIVITAMIN WITH MINERALS) TABS tablet Take 1 tablet by mouth 2 (two) times a day.    . nitroGLYCERIN (NITROSTAT) 0.4 MG SL tablet Place 0.4 mg under the tongue every 5 (five) minutes as needed for chest pain.    Marland Kitchen OVER THE COUNTER MEDICATION Take 1 tablet by mouth daily.  Neuriva otc supplement    . OVER THE COUNTER MEDICATION Take 2 tablets by mouth at bedtime. Olly Sleep    . OVER THE COUNTER MEDICATION Take 1 tablet by mouth 3 (three) times daily with meals. Sugar Blockers    . Semaglutide, 1 MG/DOSE, (OZEMPIC, 1 MG/DOSE,) 2 MG/1.5ML SOPN Inject 1 mg into the skin every Monday.    . traZODone (DESYREL) 50 MG tablet Take 25 mg by mouth at bedtime.      No current facility-administered medications for this visit.    Review of Systems  Constitutional:  Constitutional negative. HENT: HENT negative.  Eyes: Eyes negative.  Respiratory: Respiratory negative.  Cardiovascular: Positive for leg swelling.  GI: Gastrointestinal negative.  Musculoskeletal: Musculoskeletal negative.  Skin: Positive for wound.  Neurological: Neurological negative. Hematologic: Hematologic/lymphatic negative.  Psychiatric: Psychiatric negative.        Objective:   Vitals:   03/18/21 0724 03/18/21 0827  BP: (!) 140/33 138/61  Pulse:    Resp:    Temp:    SpO2:      Physical Exam HENT:     Head: Normocephalic.     Nose:     Comments: Wearing a mask Eyes:     Pupils: Pupils are equal, round, and reactive to light.  Cardiovascular:     Pulses:          Radial pulses are 2+ on the right side and 2+ on the left side.       Popliteal pulses are 0 on the right side and 0 on the left side.       Dorsalis pedis pulses are 0 on the right side and 0 on the left side.       Posterior tibial pulses are 0 on the right side and 0 on the left side.  Pulmonary:     Effort: Pulmonary effort is normal.  Abdominal:     General: Abdomen is flat.     Palpations: Abdomen is soft.  Musculoskeletal:        General: Normal range of motion.     Right lower leg: No edema.     Left lower leg: Edema present.  Skin:    Capillary Refill: Capillary refill takes 2 to 3 seconds.     Comments: Left fourth toe has dry gangrenous eschar on the lateral and inferior aspect   Neurological:     General: No focal deficit present.     Mental Status: He is alert.  Psychiatric:  Mood and Affect: Mood normal.        Behavior: Behavior normal.        Thought Content: Thought content normal.        Judgment: Judgment normal.     Data: ABI Findings:  +---------+------------------+-----+--------+--------+  Right  Rt Pressure (mmHg)IndexWaveformComment   +---------+------------------+-----+--------+--------+  Brachial 255                     +---------+------------------+-----+--------+--------+  ATA   255        1.00 biphasic      +---------+------------------+-----+--------+--------+  PTA   255        1.00 biphasic      +---------+------------------+-----+--------+--------+  Great Toe81        0.32 Abnormal      +---------+------------------+-----+--------+--------+   +---------+------------------+-----+--------+-------+  Left   Lt Pressure (mmHg)IndexWaveformComment  +---------+------------------+-----+--------+-------+  Brachial 255                     +---------+------------------+-----+--------+-------+  ATA   255        1.00 biphasic      +---------+------------------+-----+--------+-------+  PTA   255        1.00 biphasic      +---------+------------------+-----+--------+-------+  Great Toe44        0.17 Abnormal      +---------+------------------+-----+--------+-------+   +-------+-----------+-----------+------------+------------+  ABI/TBIToday's ABIToday's TBIPrevious ABIPrevious TBI  +-------+-----------+-----------+------------+------------+  Right       0.32          0.33      +-------+-----------+-----------+------------+------------+  Left        0.3          0.17       +-------+-----------+-----------+------------+------------+      Assessment/Plan:   76 year old male with previous right lower extremity endovascular invention he has healed the wound there.  He now has a left fourth toe ulceration he is indicated for 4th toe amputation.  Maeola Harman MD [facility]

## 2021-03-18 NOTE — Discharge Instructions (Signed)
You may remove the dressing in 24 hours shower as needed and cover with dry dressing.  Heel weight bearing in post op shoe.

## 2021-03-18 NOTE — Transfer of Care (Signed)
Immediate Anesthesia Transfer of Care Note  Patient: [name]  Procedure(s) Performed: LEFT FOURTH TOE AMPUTATION (Left Fourth Toe)  Patient Location: PACU  Anesthesia Type:MAC  Level of Consciousness: awake, patient cooperative and responds to stimulation  Airway & Oxygen Therapy: Patient Spontanous Breathing and Patient connected to face mask oxygen  Post-op Assessment: Report given to RN and Post -op Vital signs reviewed and stable  Post vital signs: Reviewed and stable  Last Vitals:  Vitals Value Taken Time  BP 115/42 03/18/21 1025  Temp    Pulse 89 03/18/21 1026  Resp 19 03/18/21 1026  SpO2 97 % 03/18/21 1026  Vitals shown include unvalidated device data.  Last Pain:  Vitals:   03/18/21 0756  TempSrc:   PainSc: 0-No pain      Patients Stated Pain Goal: 4 (32/00/37 9444)  Complications: No complications documented.

## 2021-03-18 NOTE — Anesthesia Procedure Notes (Signed)
Anesthesia Regional Block: Ankle block   Pre-Anesthetic Checklist: ,, timeout performed, Correct Patient, Correct Site, Correct Laterality, Correct Procedure, Correct Position, site marked, Risks and benefits discussed,  Surgical consent,  Pre-op evaluation,  At surgeon's request and post-op pain management  Laterality: Left     Needles:   Needle Type: Stimulator Needle - 40      Needle Gauge: 22     Additional Needles:   Procedures:,,,,, intact distal pulses,,,  Narrative:  Start time: 03/18/2021 8:39 AM End time: 03/18/2021 8:54 AM Injection made incrementally with aspirations every 5 mL.  Performed by: Personally  Anesthesiologist: Lewie Loron, MD  Additional Notes: Ankle block performed in preop. Pt tolerated well. No RBCs aspirated and pt had good analgesic response.

## 2021-03-18 NOTE — Anesthesia Postprocedure Evaluation (Signed)
Anesthesia Post Note  Patient: [name]  Procedure(s) Performed: LEFT FOURTH TOE AMPUTATION (Left Fourth Toe)     Patient location during evaluation: PACU Anesthesia Type: Regional Level of consciousness: awake and alert Pain management: pain level controlled Vital Signs Assessment: post-procedure vital signs reviewed and stable Respiratory status: spontaneous breathing Cardiovascular status: stable Anesthetic complications: no   No complications documented.  Last Vitals:  Vitals:   03/18/21 1040 03/18/21 1055  BP: (!) 121/39 (!) 135/36  Pulse: 86 85  Resp: (!) 9 10  Temp:  36.7 C  SpO2: 96% 94%    Last Pain:  Vitals:   03/18/21 1055  TempSrc:   PainSc: 0-No pain                 Lewie Loron

## 2021-03-19 ENCOUNTER — Encounter (HOSPITAL_COMMUNITY): Payer: Self-pay | Admitting: Vascular Surgery

## 2021-03-21 ENCOUNTER — Ambulatory Visit (INDEPENDENT_AMBULATORY_CARE_PROVIDER_SITE_OTHER): Payer: No Typology Code available for payment source | Admitting: Vascular Surgery

## 2021-03-21 ENCOUNTER — Other Ambulatory Visit: Payer: Self-pay

## 2021-03-21 ENCOUNTER — Encounter: Payer: Self-pay | Admitting: Vascular Surgery

## 2021-03-21 ENCOUNTER — Telehealth: Payer: Self-pay

## 2021-03-21 VITALS — BP 163/78 | HR 85 | Temp 97.2°F | Resp 20 | Ht 68.0 in | Wt 218.0 lb

## 2021-03-21 DIAGNOSIS — L03116 Cellulitis of left lower limb: Secondary | ICD-10-CM

## 2021-03-21 DIAGNOSIS — I739 Peripheral vascular disease, unspecified: Secondary | ICD-10-CM

## 2021-03-21 MED ORDER — AMOXICILLIN-POT CLAVULANATE 500-125 MG PO TABS: 1.0000 | ORAL_TABLET | Freq: Three times a day (TID) | ORAL | 0 refills | Status: DC

## 2021-03-21 NOTE — Progress Notes (Signed)
    Subjective:     Patient ID: [name], male   DOB: 1946/07/05, 75 y.o.   MRN: [id number]  HPI 75 year old male history of gangrene of his left fourth toe.  He underwent amputation 3 days ago.  He states that last night he woke up with severe pain in his left foot noted that it was red.  He has not had a fever temperature at home was 99.  Not currently on antibiotics does not have allergies.  Has associated swelling of the left foot and ankle   Review of Systems As above    Objective:   Physical Exam Vitals:   03/21/21 1345  BP: (!) 163/78  Pulse: 85  Resp: 20  Temp: (!) 97.2 F (36.2 C)  SpO2: 98%         Assessment:     29 male status post left fourth toe amputation.  Foot appears very well perfused however he does have cellulitis.    Plan:     Prescription for Augmentin sent [facility] his [facility] in [place]  He will follow-up in 1 week for wound recheck  I have instructed the patient that should his pain or erythema worsen or should he develop unrelenting fever he needs to present to the [facility] emergency department where we can initiate IV antibiotics and consider opening up his wound.    Romaldo Saville C. Randie Heinz, MD [facility] Office: [phone] Pager: [phone]

## 2021-03-21 NOTE — Telephone Encounter (Signed)
Pt s/p toe amputation this week on 03/18/21, called with c/o toe with increased pain/swelling/redness since this morning when he woke up. Pt denies fever and no drainage from the area. MD is aware and has been added on to MD's schedule to be seen in office today. Pt verbalized understanding of this appt.

## 2021-03-26 DIAGNOSIS — L97509 Non-pressure chronic ulcer of other part of unspecified foot with unspecified severity: Secondary | ICD-10-CM | POA: Insufficient documentation

## 2021-03-26 DIAGNOSIS — Z1211 Encounter for screening for malignant neoplasm of colon: Secondary | ICD-10-CM | POA: Insufficient documentation

## 2021-03-26 DIAGNOSIS — I259 Chronic ischemic heart disease, unspecified: Secondary | ICD-10-CM | POA: Insufficient documentation

## 2021-03-26 DIAGNOSIS — H269 Unspecified cataract: Secondary | ICD-10-CM | POA: Insufficient documentation

## 2021-03-26 DIAGNOSIS — J40 Bronchitis, not specified as acute or chronic: Secondary | ICD-10-CM | POA: Insufficient documentation

## 2021-03-26 DIAGNOSIS — H52209 Unspecified astigmatism, unspecified eye: Secondary | ICD-10-CM | POA: Insufficient documentation

## 2021-03-26 DIAGNOSIS — F338 Other recurrent depressive disorders: Secondary | ICD-10-CM | POA: Insufficient documentation

## 2021-03-26 DIAGNOSIS — Z9861 Coronary angioplasty status: Secondary | ICD-10-CM | POA: Insufficient documentation

## 2021-03-26 DIAGNOSIS — F4329 Adjustment disorder with other symptoms: Secondary | ICD-10-CM | POA: Insufficient documentation

## 2021-03-26 DIAGNOSIS — M25519 Pain in unspecified shoulder: Secondary | ICD-10-CM | POA: Insufficient documentation

## 2021-03-26 DIAGNOSIS — Z85828 Personal history of other malignant neoplasm of skin: Secondary | ICD-10-CM | POA: Insufficient documentation

## 2021-03-26 DIAGNOSIS — I10 Essential (primary) hypertension: Secondary | ICD-10-CM | POA: Insufficient documentation

## 2021-03-26 DIAGNOSIS — N529 Male erectile dysfunction, unspecified: Secondary | ICD-10-CM | POA: Insufficient documentation

## 2021-03-26 DIAGNOSIS — I2510 Atherosclerotic heart disease of native coronary artery without angina pectoris: Secondary | ICD-10-CM | POA: Insufficient documentation

## 2021-03-26 DIAGNOSIS — G5600 Carpal tunnel syndrome, unspecified upper limb: Secondary | ICD-10-CM | POA: Insufficient documentation

## 2021-03-26 DIAGNOSIS — L570 Actinic keratosis: Secondary | ICD-10-CM | POA: Insufficient documentation

## 2021-03-26 DIAGNOSIS — E113299 Type 2 diabetes mellitus with mild nonproliferative diabetic retinopathy without macular edema, unspecified eye: Secondary | ICD-10-CM | POA: Insufficient documentation

## 2021-03-26 DIAGNOSIS — I6990 Unspecified sequelae of unspecified cerebrovascular disease: Secondary | ICD-10-CM | POA: Insufficient documentation

## 2021-03-26 DIAGNOSIS — B359 Dermatophytosis, unspecified: Secondary | ICD-10-CM | POA: Insufficient documentation

## 2021-03-26 DIAGNOSIS — H524 Presbyopia: Secondary | ICD-10-CM | POA: Insufficient documentation

## 2021-03-26 DIAGNOSIS — F4310 Post-traumatic stress disorder, unspecified: Secondary | ICD-10-CM | POA: Insufficient documentation

## 2021-03-26 DIAGNOSIS — M109 Gout, unspecified: Secondary | ICD-10-CM | POA: Insufficient documentation

## 2021-03-26 DIAGNOSIS — Z03818 Encounter for observation for suspected exposure to other biological agents ruled out: Secondary | ICD-10-CM | POA: Insufficient documentation

## 2021-03-26 DIAGNOSIS — M545 Low back pain, unspecified: Secondary | ICD-10-CM | POA: Insufficient documentation

## 2021-03-28 ENCOUNTER — Ambulatory Visit (INDEPENDENT_AMBULATORY_CARE_PROVIDER_SITE_OTHER): Payer: No Typology Code available for payment source | Admitting: Physician Assistant

## 2021-03-28 ENCOUNTER — Other Ambulatory Visit: Payer: Self-pay

## 2021-03-28 VITALS — BP 123/56 | HR 88 | Temp 98.2°F | Resp 20 | Ht 68.0 in | Wt 223.1 lb

## 2021-03-28 DIAGNOSIS — L03116 Cellulitis of left lower limb: Secondary | ICD-10-CM

## 2021-03-28 MED ORDER — OXYCODONE-ACETAMINOPHEN 10-325 MG PO TABS: 1.0000 | ORAL_TABLET | Freq: Four times a day (QID) | ORAL | 0 refills | Status: DC | PRN

## 2021-03-28 MED ORDER — AMOXICILLIN-POT CLAVULANATE 875-125 MG PO TABS: 1.0000 | ORAL_TABLET | Freq: Two times a day (BID) | ORAL | 0 refills | Status: DC

## 2021-03-28 NOTE — Progress Notes (Signed)
POST OPERATIVE OFFICE NOTE    CC:  F/u for surgery  HPI:  This is a 75 y.o. male who is s/p  left fourth toe amputation.  He developed postoperative cellulitis of the left forefoot and was placed on Augmentin.  He returns today for reevaluation.  He complains of ongoing pain and tightness at his amputation site.  He reports improved appearance of the erythema.  He denies fever, chills or drainage from this area.  He has been applying Santyl ointment to his incision.  He is compliant with Plavix and statin.  He completed 1 week of Augmentin.  He states his blood sugars are well controlled.  Allergies  Allergen Reactions  . Ace Inhibitors     Heart attack   . Lisinopril     Heart attack    Current Outpatient Medications  Medication Sig Dispense Refill  . acetaminophen (TYLENOL) 500 MG tablet Take 1,500 mg by mouth 2 (two) times daily as needed (pain).    Marland Kitchen amoxicillin-clavulanate (AUGMENTIN) 500-125 MG tablet Take 1 tablet (500 mg total) by mouth 3 (three) times daily. 21 tablet 0  . aspirin EC 325 MG tablet Take 325 mg by mouth in the morning.    Marland Kitchen atorvastatin (LIPITOR) 40 MG tablet Take 20 mg by mouth in the morning.    . Charcoal Activated (ACTIVATED CHARCOAL PO) Take 520 mg by mouth in the morning and at bedtime.    . Cholecalciferol 2000 units TABS Take 2,000 Units by mouth in the morning.    . citalopram (CELEXA) 40 MG tablet Take 20 mg by mouth in the morning.    . clopidogrel (PLAVIX) 75 MG tablet Take 75 mg by mouth in the morning.    . Coenzyme Q10 (COQ-10) 200 MG CAPS Take 200 mg by mouth in the morning.    . fluorouracil (EFUDEX) 5 % cream Apply 1 application topically 2 (two) times daily as needed (cancerous flares).     . furosemide (LASIX) 40 MG tablet Take 40 mg by mouth in the morning.    Chilton Si Tea, Camellia sinensis, (GREEN TEA EXTRACT PO) Take 2 tablets by mouth 2 (two) times a day.    . insulin aspart protamine- aspart (NOVOLOG MIX 70/30) (70-30) 100 UNIT/ML  injection Inject 30 Units into the skin in the morning and at bedtime.    . Melatonin-Theanine (MELATONIN FORTE/L-THEANINE PO) Take 2 tablets by mouth at bedtime. OLLY Sleep gummies    . metFORMIN (GLUCOPHAGE) 1000 MG tablet Take 1,000 mg by mouth in the morning and at bedtime.    . metoprolol tartrate (LOPRESSOR) 50 MG tablet Take 50 mg by mouth 2 (two) times daily.    . Misc Natural Products (NEURIVA) CAPS Take 1 capsule by mouth at bedtime.    . Multiple Vitamin (MULTIVITAMIN WITH MINERALS) TABS tablet Take 1 tablet by mouth in the morning, at noon, and at bedtime. Men's 50+    . nitroGLYCERIN (NITROSTAT) 0.4 MG SL tablet Place 0.4 mg under the tongue every 5 (five) minutes x 3 doses as needed for chest pain.    Marland Kitchen OVER THE COUNTER MEDICATION Take 1 tablet by mouth 3 (three) times daily with meals. Sugar Blockers    . oxyCODONE-acetaminophen (PERCOCET/ROXICET) 5-325 MG tablet Take 1 tablet by mouth every 6 (six) hours as needed. 20 tablet 0  . Resveratrol 250 MG CAPS Take 250 mg by mouth daily with lunch.    . Semaglutide, 1 MG/DOSE, (OZEMPIC, 1 MG/DOSE,) 4 MG/3ML SOPN Inject  1 mg into the skin every Monday.    . traZODone (DESYREL) 50 MG tablet Take 25 mg by mouth at bedtime.      No current facility-administered medications for this visit.     ROS:  See HPI  BP (!) 123/56 (BP Location: Right Arm, Patient Position: Sitting, Cuff Size: Large)   Pulse 88   Temp 98.2 F (36.8 C) (Temporal)   Resp 20   Ht 5\' 8"  (1.727 m)   Wt 223 lb 1.6 oz (101.2 kg)   SpO2 97%   BMI 33.92 kg/m   Physical Exam:  General appearance: Well developed, well-nourished in no apparent distress Cardiac: Heart rate and rhythm are regular Respiratory: Clear to auscultation bilaterally Incision: Well approximated with slight skin edge darkening and mildly macerated. Extremities: Left lower extremity.  There is mild edema of the ankle and foot.  Erythema extends from his fifth toe and the amputation site to  approximately mid forefoot.  There is no drainage from his incision.  His foot is warm with a palpable left anterior tibial pulse. Neuro: Alert and oriented x4      Assessment/Plan:  This is a 75 y.o. male who is s/p: Left fourth toe amputation on March 18, 2021.  Ongoing cellulitis of the left forefoot.  Dr. March 20, 2021 examined the patient today and agrees with additional 10 days of antibiotics.  I advised the patient not to use Santyl but to keep dry gauze on his incision. We will also refill his oxycodone.  He will follow-up on April 19th.  April 21, PA-C [facility] [phone]  Clinic MD: Dr. [phone]

## 2021-03-28 NOTE — Addendum Note (Signed)
Addended by: Milinda Antis on: 03/28/2021 12:34 PM   Modules accepted: Orders

## 2021-03-31 ENCOUNTER — Ambulatory Visit: Payer: No Typology Code available for payment source | Admitting: Vascular Surgery

## 2021-04-08 ENCOUNTER — Other Ambulatory Visit: Payer: Self-pay

## 2021-04-08 ENCOUNTER — Ambulatory Visit (INDEPENDENT_AMBULATORY_CARE_PROVIDER_SITE_OTHER): Payer: No Typology Code available for payment source | Admitting: Physician Assistant

## 2021-04-08 VITALS — BP 106/58 | HR 81 | Temp 97.9°F | Resp 20 | Ht 68.0 in | Wt 222.8 lb

## 2021-04-08 DIAGNOSIS — I739 Peripheral vascular disease, unspecified: Secondary | ICD-10-CM

## 2021-04-08 DIAGNOSIS — L03116 Cellulitis of left lower limb: Secondary | ICD-10-CM

## 2021-04-08 NOTE — Progress Notes (Signed)
POST OPERATIVE OFFICE NOTE    CC:  F/u for surgery  HPI:  This is a 75 y.o. male who is s/p left fourth toe amputation.  He developed postoperative cellulitis of the left forefoot and was placed on Augmentin.  He returns today for reevaluation.  He was seen last week with persistent, but improved appearance in the erythema. He complains of tightness and cold sensation of his left foot.   He denies fever, chills or drainage from this area.   He is compliant with Plavix and statin.  He has completed another 10 days of Augmentin as instructed. He states his blood sugars are well controlled.  Recent aortogram without intervention (see below).  His ABIs are non-compressible.  Allergies  Allergen Reactions  . Ace Inhibitors     Heart attack   . Lisinopril     Heart attack    Current Outpatient Medications  Medication Sig Dispense Refill  . acetaminophen (TYLENOL) 500 MG tablet Take 1,500 mg by mouth 2 (two) times daily as needed (pain).    Marland Kitchen amoxicillin-clavulanate (AUGMENTIN) 875-125 MG tablet Take 1 tablet by mouth 2 (two) times daily. 20 tablet 0  . aspirin EC 325 MG tablet Take 325 mg by mouth in the morning.    Marland Kitchen atorvastatin (LIPITOR) 40 MG tablet Take 20 mg by mouth in the morning.    . Charcoal Activated (ACTIVATED CHARCOAL PO) Take 520 mg by mouth in the morning and at bedtime.    . Cholecalciferol 2000 units TABS Take 2,000 Units by mouth in the morning.    . citalopram (CELEXA) 40 MG tablet Take 20 mg by mouth in the morning.    . clopidogrel (PLAVIX) 75 MG tablet Take 75 mg by mouth in the morning.    . Coenzyme Q10 (COQ-10) 200 MG CAPS Take 200 mg by mouth in the morning.    . fluorouracil (EFUDEX) 5 % cream Apply 1 application topically 2 (two) times daily as needed (cancerous flares).     . furosemide (LASIX) 40 MG tablet Take 40 mg by mouth in the morning.    Chilton Si Tea, Camellia sinensis, (GREEN TEA EXTRACT PO) Take 2 tablets by mouth 2 (two) times a day.    . insulin  aspart protamine- aspart (NOVOLOG MIX 70/30) (70-30) 100 UNIT/ML injection Inject 30 Units into the skin in the morning and at bedtime.    . Melatonin-Theanine (MELATONIN FORTE/L-THEANINE PO) Take 2 tablets by mouth at bedtime. OLLY Sleep gummies    . metFORMIN (GLUCOPHAGE) 1000 MG tablet Take 1,000 mg by mouth in the morning and at bedtime.    . metoprolol tartrate (LOPRESSOR) 50 MG tablet Take 50 mg by mouth 2 (two) times daily.    . Misc Natural Products (NEURIVA) CAPS Take 1 capsule by mouth at bedtime.    . Multiple Vitamin (MULTIVITAMIN WITH MINERALS) TABS tablet Take 1 tablet by mouth in the morning, at noon, and at bedtime. Men's 50+    . nitroGLYCERIN (NITROSTAT) 0.4 MG SL tablet Place 0.4 mg under the tongue every 5 (five) minutes x 3 doses as needed for chest pain.    Marland Kitchen OVER THE COUNTER MEDICATION Take 1 tablet by mouth 3 (three) times daily with meals. Sugar Blockers    . oxyCODONE-acetaminophen (PERCOCET) 10-325 MG tablet Take 1 tablet by mouth every 6 (six) hours as needed for pain. 20 tablet 0  . Resveratrol 250 MG CAPS Take 250 mg by mouth daily with lunch.    Marland Kitchen  Semaglutide, 1 MG/DOSE, (OZEMPIC, 1 MG/DOSE,) 4 MG/3ML SOPN Inject 1 mg into the skin every Monday.    . traZODone (DESYREL) 50 MG tablet Take 25 mg by mouth at bedtime.      No current facility-administered medications for this visit.     ROS:  See HPI   Physical Exam:  General appearance: Well developed, well-nourished in no apparent distress Cardiac: Heart rate and rhythm are regular Respiratory: Clear to auscultation bilaterally Extremities: Left lower extremity: Erythema improved.   Tiny ulcer of the tip of the left  5th toe. There is no drainage from his incision.  His foot is warm with brisk DP, peroneal and PT doppler signals. Neuro: Alert and oriented x4          TBIs 02/24/2021 ABI/TBI Today's TBI  Previous TBI Right 0.32 0.33 Left 0.3 0.17  Aortogram 03/10/2021: Indications: 75 year old male  with a history of right lower extremity endovascular intervention.  He healed wounds after that.  He now has left fourth toe ulceration with gangrenous changes and loss of the nail.  He is indicated for angiography with possible intervention.  Findings: The aorta and iliac segments are free of flow-limiting stenosis.  Bilateral renal arteries are patent no flow-limiting stenosis.  The right side the SFA popliteal arteries are patent.  Appears to have runoff via the posterior tibial artery in line to the foot on the right.  On the left side which is the site of interest he possibly has a low-grade stenosis in the mid anterior tibial artery after the SFA and popliteal are patent.  His posterior tibial artery is smaller however has inline flow all the way and fills the arch of the foot and no intervention was undertaken.  Peroneal artery is occluded shortly after the takeoff.  Assessment/Plan:  This is a 75 y.o. male who is s/p: Left fourth toe amputation on March 18, 2021. Improving cellulitis of the left forefoot. I removed his sutures and debrided some devitalized tissue from the wound bed. I probed the base with sterile Q tip. This did not tunnel. Promogran Prisma Ag placed in wound bed and covered with dry gauze and kling wrap. He is given Promogran and his daughter can change the dressing every two to three days.  PAD with non-compressible ABIs  Importance of managing his hypertension, hyperlipidemia and diabetes mellitus is reinforced.  Continue Plavix and aspirin.  Follow-up: in two weeks  Wendi Maya, PA-C [facility] [phone]  Clinic MD:  Lenell Antu

## 2021-04-22 ENCOUNTER — Other Ambulatory Visit: Payer: Self-pay

## 2021-04-22 ENCOUNTER — Encounter: Payer: Self-pay | Admitting: Physician Assistant

## 2021-04-22 ENCOUNTER — Encounter: Payer: Self-pay | Admitting: *Deleted

## 2021-04-22 ENCOUNTER — Ambulatory Visit (INDEPENDENT_AMBULATORY_CARE_PROVIDER_SITE_OTHER): Payer: No Typology Code available for payment source | Admitting: Physician Assistant

## 2021-04-22 VITALS — BP 110/70 | HR 84 | Temp 97.9°F | Resp 20 | Ht 68.0 in | Wt 224.8 lb

## 2021-04-22 DIAGNOSIS — L03116 Cellulitis of left lower limb: Secondary | ICD-10-CM

## 2021-04-22 DIAGNOSIS — I739 Peripheral vascular disease, unspecified: Secondary | ICD-10-CM

## 2021-04-22 NOTE — Progress Notes (Signed)
Office Note     CC:  follow up Requesting Provider:  [facility]  HPI: [name] is a 75 y.o. (05-08-1946) male who presents status post left fourth toe amputation by Dr. Randie Heinz on 03/18/2021.  He underwent an arteriogram prior to surgery however no intervention was warranted.  Patient states the wound looks worse and he has redness on his foot despite being on an antibiotic prescribed by the [place].  He has not been doing much in the way of wound care.  He also states his wife refuses to have anything to do with the wound.  He is on aspirin and statin.  He is an insulin-dependent diabetic.   Past Medical History:  Diagnosis Date  . CAD, AUTOLOGOUS BYPASS GRAFT   . Depression   . DIABETES, TYPE 2   . DYSPNEA    on exertion  . HYPERLIPIDEMIA   . OBESITY   . OBSTRUCTIVE SLEEP APNEA    cpap  . Old myocardial infarction 2005  . Peripheral vascular disease (HCC)   . Stroke [facility])    light stroke    Past Surgical History:  Procedure Laterality Date  . ABDOMINAL AORTOGRAM W/LOWER EXTREMITY Bilateral 03/10/2021   Procedure: ABDOMINAL AORTOGRAM W/LOWER EXTREMITY;  Surgeon: Maeola Harman, MD;  Location: [facility] INVASIVE CV LAB;  Service: Cardiovascular;  Laterality: Bilateral;  . AMPUTATION Left 03/18/2021   Procedure: LEFT FOURTH TOE AMPUTATION;  Surgeon: Maeola Harman, MD;  Location: [facility] OR;  Service: Vascular;  Laterality: Left;  . APPENDECTOMY  1965  . CORONARY ARTERY BYPASS GRAFT  2008   at [place]  . cyst remove on lelt leg,posterior    . LOWER EXTREMITY ANGIOGRAPHY N/A 03/07/2018   Procedure: LOWER EXTREMITY ANGIOGRAPHY;  Surgeon: Maeola Harman, MD;  Location: [facility] INVASIVE CV LAB;  Service: Cardiovascular;  Laterality: N/A;  . LOWER EXTREMITY ANGIOGRAPHY Bilateral 05/29/2019   Procedure: LOWER EXTREMITY ANGIOGRAPHY;  Surgeon: Maeola Harman, MD;  Location: [facility] INVASIVE CV LAB;  Service: Cardiovascular;  Laterality: Bilateral;  .  PERIPHERAL VASCULAR ATHERECTOMY Right 03/07/2018   Procedure: PERIPHERAL VASCULAR ATHERECTOMY;  Surgeon: Maeola Harman, MD;  Location: [facility] INVASIVE CV LAB;  Service: Cardiovascular;  Laterality: Right;  anterioer tibial  . VASECTOMY      Social History   Socioeconomic History  . Marital status: Married    Spouse name: Not on file  . Number of children: Not on file  . Years of education: Not on file  . Highest education level: Not on file  Occupational History  . Not on file  Tobacco Use  . Smoking status: Former Smoker    Types: Pipe  . Smokeless tobacco: Never Used  Vaping Use  . Vaping Use: Never used  Substance and Sexual Activity  . Alcohol use: Never  . Drug use: Never  . Sexual activity: Not on file  Other Topics Concern  . Not on file  Social History Narrative  . Not on file   Social Determinants of Health   Financial Resource Strain: Not on file  Food Insecurity: Not on file  Transportation Needs: Not on file  Physical Activity: Not on file  Stress: Not on file  Social Connections: Not on file  Intimate Partner Violence: Not on file    Family History  Problem Relation Age of Onset  . Diabetes Mother   . Alcoholism Father     Current Outpatient Medications  Medication Sig Dispense Refill  . acetaminophen (TYLENOL) 500 MG  tablet Take 1,500 mg by mouth 2 (two) times daily as needed (pain).    Marland Kitchen amoxicillin-clavulanate (AUGMENTIN) 875-125 MG tablet Take 1 tablet by mouth 2 (two) times daily. (Patient not taking: Reported on 04/08/2021) 20 tablet 0  . aspirin EC 325 MG tablet Take 325 mg by mouth in the morning.    Marland Kitchen atorvastatin (LIPITOR) 40 MG tablet Take 20 mg by mouth in the morning.    . Charcoal Activated (ACTIVATED CHARCOAL PO) Take 520 mg by mouth in the morning and at bedtime.    . Cholecalciferol 2000 units TABS Take 2,000 Units by mouth in the morning.    . citalopram (CELEXA) 40 MG tablet Take 20 mg by mouth in the morning.    .  clopidogrel (PLAVIX) 75 MG tablet Take 75 mg by mouth in the morning.    . Coenzyme Q10 (COQ-10) 200 MG CAPS Take 200 mg by mouth in the morning.    . fluorouracil (EFUDEX) 5 % cream Apply 1 application topically 2 (two) times daily as needed (cancerous flares).     . furosemide (LASIX) 40 MG tablet Take 40 mg by mouth in the morning.    Chilton Si Tea, Camellia sinensis, (GREEN TEA EXTRACT PO) Take 2 tablets by mouth 2 (two) times a day.    . insulin aspart protamine- aspart (NOVOLOG MIX 70/30) (70-30) 100 UNIT/ML injection Inject 30 Units into the skin in the morning and at bedtime.    . Melatonin-Theanine (MELATONIN FORTE/L-THEANINE PO) Take 2 tablets by mouth at bedtime. OLLY Sleep gummies    . metFORMIN (GLUCOPHAGE) 1000 MG tablet Take 1,000 mg by mouth in the morning and at bedtime.    . metoprolol tartrate (LOPRESSOR) 50 MG tablet Take 50 mg by mouth 2 (two) times daily.    . Misc Natural Products (NEURIVA) CAPS Take 1 capsule by mouth at bedtime.    . Multiple Vitamin (MULTIVITAMIN WITH MINERALS) TABS tablet Take 1 tablet by mouth in the morning, at noon, and at bedtime. Men's 50+    . nitroGLYCERIN (NITROSTAT) 0.4 MG SL tablet Place 0.4 mg under the tongue every 5 (five) minutes x 3 doses as needed for chest pain.    Marland Kitchen OVER THE COUNTER MEDICATION Take 1 tablet by mouth 3 (three) times daily with meals. Sugar Blockers    . oxyCODONE-acetaminophen (PERCOCET) 10-325 MG tablet Take 1 tablet by mouth every 6 (six) hours as needed for pain. (Patient not taking: Reported on 04/08/2021) 20 tablet 0  . Resveratrol 250 MG CAPS Take 250 mg by mouth daily with lunch.    . Semaglutide, 1 MG/DOSE, (OZEMPIC, 1 MG/DOSE,) 4 MG/3ML SOPN Inject 1 mg into the skin every Monday.    . traZODone (DESYREL) 50 MG tablet Take 25 mg by mouth at bedtime.      No current facility-administered medications for this visit.    Allergies  Allergen Reactions  . Ace Inhibitors     Heart attack   . Lisinopril     Heart  attack     REVIEW OF SYSTEMS:   [X]  denotes positive finding, [ ]  denotes negative finding Cardiac  Comments:  Chest pain or chest pressure:    Shortness of breath upon exertion:    Short of breath when lying flat:    Irregular heart rhythm:        Vascular    Pain in calf, thigh, or hip brought on by ambulation:    Pain in feet at night that wakes  you up from your sleep:     Blood clot in your veins:    Leg swelling:         Pulmonary    Oxygen at home:    Productive cough:     Wheezing:         Neurologic    Sudden weakness in arms or legs:     Sudden numbness in arms or legs:     Sudden onset of difficulty speaking or slurred speech:    Temporary loss of vision in one eye:     Problems with dizziness:         Gastrointestinal    Blood in stool:     Vomited blood:         Genitourinary    Burning when urinating:     Blood in urine:        Psychiatric    Major depression:         Hematologic    Bleeding problems:    Problems with blood clotting too easily:        Skin    Rashes or ulcers:        Constitutional    Fever or chills:      PHYSICAL EXAMINATION:  Vitals:   04/22/21 0951  BP: 110/70  Pulse: 84  Resp: 20  Temp: 97.9 F (36.6 C)  TempSrc: Temporal  SpO2: 96%  Weight: 224 lb 12.8 oz (102 kg)  Height: 5\' 8"  (1.727 m)    General:  WDWN in NAD; vital signs documented above Gait: Not observed HENT: WNL, normocephalic Pulmonary: normal non-labored breathing Cardiac: regular HR Abdomen: soft, NT, no masses Skin: without rashes Vascular Exam/Pulses: Brisk left DP and PT by Doppler Extremities: Left fourth toe amp site with fibrinous exudate with erythema onto the dorsal foot; there is also an odor Musculoskeletal: no muscle wasting or atrophy  Neurologic: A&O X 3;  No focal weakness or paresthesias are detected Psychiatric:  The pt has Normal affect.    ASSESSMENT/PLAN:: 75 y.o. male here for recheck of left fourth toe amputation  site  -Left foot is warm with a brisk DP and PT Doppler signal on exam -Despite the above the left fourth toe amputation site does not appear to be healing; and now has an odor with erythema spreading onto the dorsal foot -Amputation site was debrided as much as the patient can tolerate in the exam room -Plan will be for return to the operating room for debridement of surgical site and possible fifth toe amputation -In the meantime patient will cleanse the wound with soap and water twice daily; he will also continue his antibiotic to completion -Surgery was discussed with the patient and he agrees to proceed   66, PA-C [facility] [phone]  Clinic MD:   [phone]

## 2021-04-28 ENCOUNTER — Other Ambulatory Visit (HOSPITAL_COMMUNITY): Payer: No Typology Code available for payment source

## 2021-05-01 ENCOUNTER — Other Ambulatory Visit (HOSPITAL_COMMUNITY)
Admission: RE | Admit: 2021-05-01 | Discharge: 2021-05-01 | Disposition: A | Discharge status: Home / Self Care | Payer: Medicare Other | Source: Ambulatory Visit | Attending: Vascular Surgery | Admitting: Vascular Surgery

## 2021-05-01 ENCOUNTER — Other Ambulatory Visit: Payer: Self-pay

## 2021-05-01 ENCOUNTER — Encounter (HOSPITAL_COMMUNITY): Payer: Self-pay | Admitting: Vascular Surgery

## 2021-05-01 DIAGNOSIS — Z01812 Encounter for preprocedural laboratory examination: Secondary | ICD-10-CM | POA: Diagnosis present

## 2021-05-01 DIAGNOSIS — Z20822 Contact with and (suspected) exposure to covid-19: Secondary | ICD-10-CM | POA: Insufficient documentation

## 2021-05-01 LAB — SARS CORONAVIRUS 2 (TAT 6-24 HRS): SARS Coronavirus 2: NEGATIVE

## 2021-05-01 NOTE — Anesthesia Preprocedure Evaluation (Addendum)
Anesthesia Evaluation  Patient identified by MRN, date of birth, ID band Patient awake    Reviewed: Allergy & Precautions, Patient's Chart, lab work & pertinent test results  Airway Mallampati: II       Dental  (+) Dental Advisory Given   Pulmonary shortness of breath, sleep apnea , former smoker,    Pulmonary exam normal  (-) decreased breath sounds      Cardiovascular hypertension, Pt. on home beta blockers and Pt. on medications + CAD, + Past MI, + CABG and + Peripheral Vascular Disease  Normal cardiovascular exam     Neuro/Psych PSYCHIATRIC DISORDERS Anxiety Depression CVA    GI/Hepatic negative GI ROS, Neg liver ROS,   Endo/Other  diabetes, Type 2  Renal/GU negative Renal ROS  negative genitourinary   Musculoskeletal negative musculoskeletal ROS (+)   Abdominal Normal abdominal exam  (+) - obese,   Peds  Hematology negative hematology ROS (+)   Anesthesia Other Findings   Reproductive/Obstetrics                           Anesthesia Physical  Anesthesia Plan  ASA: III  Anesthesia Plan: Regional and MAC   Post-op Pain Management:    Induction: Intravenous  PONV Risk Score and Plan: 1 and Ondansetron, Treatment may vary due to age or medical condition, Propofol infusion and TIVA  Airway Management Planned: Natural Airway and Simple Face Mask  Additional Equipment: None  Intra-op Plan:   Post-operative Plan:   Informed Consent: I have reviewed the patients History and Physical, chart, labs and discussed the procedure including the risks, benefits and alternatives for the proposed anesthesia with the patient or authorized representative who has indicated his/her understanding and acceptance.     Dental advisory given  Plan Discussed with: CRNA  Anesthesia Plan Comments: (PAT note written 03/17/2021 by Myra Gianotti, PA-C. )       Anesthesia Quick Evaluation

## 2021-05-01 NOTE — Progress Notes (Signed)
Mr. Savarese denies chest pain or shortness of breath. Patient was tested for Covid and has been in quarantine since that time.  Mr. Crochet has type II diabetes, patient reports that CBG's run 103 -110. Mr. Wenke stated that the nurse at Dr. Ophelia Charter office said that he should take 50% of 70/30 Novolog Insulin and 50% in am, I told patient that our protocol does not want patient to take any 70/30 Insulin in am.  I instructed Mr. Zanetti to not take Metformin in am.I instructed patient to check CBG after awaking and every 2 hours until arrival  to the hospital.  I Instructed patient if CBG is less than 70 to take 4 Glucose Tablets or 1 tube of Glucose Gel or 1/2 cup of a clear juice. Recheck CBG in 15 minutes if CBG is not over 70 call, pre- op desk at [phone] for further instructions.

## 2021-05-02 ENCOUNTER — Encounter (HOSPITAL_COMMUNITY)
Admission: RE | Disposition: A | Discharge status: Home / Self Care | Payer: Self-pay | Source: Home / Self Care | Attending: Vascular Surgery

## 2021-05-02 ENCOUNTER — Ambulatory Visit (HOSPITAL_COMMUNITY): Payer: No Typology Code available for payment source | Admitting: Certified Registered Nurse Anesthetist

## 2021-05-02 ENCOUNTER — Encounter (HOSPITAL_COMMUNITY): Payer: Self-pay | Admitting: Vascular Surgery

## 2021-05-02 ENCOUNTER — Ambulatory Visit (HOSPITAL_COMMUNITY)
Admission: RE | Admit: 2021-05-02 | Discharge: 2021-05-02 | Disposition: A | Discharge status: Home / Self Care | Payer: No Typology Code available for payment source | Attending: Vascular Surgery | Admitting: Vascular Surgery

## 2021-05-02 ENCOUNTER — Other Ambulatory Visit: Payer: Self-pay

## 2021-05-02 DIAGNOSIS — Z7982 Long term (current) use of aspirin: Secondary | ICD-10-CM | POA: Insufficient documentation

## 2021-05-02 DIAGNOSIS — E1151 Type 2 diabetes mellitus with diabetic peripheral angiopathy without gangrene: Secondary | ICD-10-CM | POA: Diagnosis not present

## 2021-05-02 DIAGNOSIS — L97529 Non-pressure chronic ulcer of other part of left foot with unspecified severity: Secondary | ICD-10-CM | POA: Diagnosis not present

## 2021-05-02 DIAGNOSIS — Z87891 Personal history of nicotine dependence: Secondary | ICD-10-CM | POA: Insufficient documentation

## 2021-05-02 DIAGNOSIS — Z951 Presence of aortocoronary bypass graft: Secondary | ICD-10-CM | POA: Diagnosis not present

## 2021-05-02 DIAGNOSIS — Z7984 Long term (current) use of oral hypoglycemic drugs: Secondary | ICD-10-CM | POA: Insufficient documentation

## 2021-05-02 DIAGNOSIS — Z8673 Personal history of transient ischemic attack (TIA), and cerebral infarction without residual deficits: Secondary | ICD-10-CM | POA: Insufficient documentation

## 2021-05-02 DIAGNOSIS — T8189XA Other complications of procedures, not elsewhere classified, initial encounter: Secondary | ICD-10-CM | POA: Diagnosis not present

## 2021-05-02 DIAGNOSIS — I96 Gangrene, not elsewhere classified: Secondary | ICD-10-CM | POA: Diagnosis not present

## 2021-05-02 DIAGNOSIS — Z89422 Acquired absence of other left toe(s): Secondary | ICD-10-CM | POA: Insufficient documentation

## 2021-05-02 DIAGNOSIS — Z7902 Long term (current) use of antithrombotics/antiplatelets: Secondary | ICD-10-CM | POA: Diagnosis not present

## 2021-05-02 DIAGNOSIS — Z79899 Other long term (current) drug therapy: Secondary | ICD-10-CM | POA: Insufficient documentation

## 2021-05-02 DIAGNOSIS — E11621 Type 2 diabetes mellitus with foot ulcer: Secondary | ICD-10-CM | POA: Diagnosis not present

## 2021-05-02 DIAGNOSIS — Y835 Amputation of limb(s) as the cause of abnormal reaction of the patient, or of later complication, without mention of misadventure at the time of the procedure: Secondary | ICD-10-CM | POA: Diagnosis not present

## 2021-05-02 HISTORY — PX: WOUND DEBRIDEMENT: SHX247

## 2021-05-02 HISTORY — DX: Post-traumatic stress disorder, unspecified: F43.10

## 2021-05-02 LAB — POCT I-STAT, CHEM 8
BUN: 24 mg/dL — ABNORMAL HIGH (ref 8–23)
Calcium, Ion: 1.19 mmol/L (ref 1.15–1.40)
Chloride: 101 mmol/L (ref 98–111)
Creatinine, Ser: 1.2 mg/dL (ref 0.61–1.24)
Glucose, Bld: 167 mg/dL — ABNORMAL HIGH (ref 70–99)
HCT: 41 % (ref 39.0–52.0)
Hemoglobin: 13.9 g/dL (ref 13.0–17.0)
Potassium: 4.8 mmol/L (ref 3.5–5.1)
Sodium: 139 mmol/L (ref 135–145)
TCO2: 27 mmol/L (ref 22–32)

## 2021-05-02 LAB — GLUCOSE, CAPILLARY
Glucose-Capillary: 179 mg/dL — ABNORMAL HIGH (ref 70–99)
Glucose-Capillary: 151 mg/dL — ABNORMAL HIGH (ref 70–99)

## 2021-05-02 SURGERY — DEBRIDEMENT, WOUND
Anesthesia: Monitor Anesthesia Care | Laterality: Left

## 2021-05-02 MED ORDER — LIDOCAINE 2% (20 MG/ML) 5 ML SYRINGE
INTRAMUSCULAR | Status: AC
  Filled 2021-05-02: qty 5

## 2021-05-02 MED ORDER — MIDAZOLAM HCL 5 MG/5ML IJ SOLN
INTRAMUSCULAR | Status: DC | PRN
  Administered 2021-05-02: 1 mg via INTRAVENOUS

## 2021-05-02 MED ORDER — PROPOFOL 10 MG/ML IV BOLUS
INTRAVENOUS | Status: AC
  Filled 2021-05-02: qty 20

## 2021-05-02 MED ORDER — CHLORHEXIDINE GLUCONATE 4 % EX LIQD: 60.0000 mL | Freq: Once | CUTANEOUS | Status: DC

## 2021-05-02 MED ORDER — LIDOCAINE HCL 1 % IJ SOLN
INTRAMUSCULAR | Status: AC
  Filled 2021-05-02: qty 20

## 2021-05-02 MED ORDER — EPHEDRINE SULFATE-NACL 50-0.9 MG/10ML-% IV SOSY
PREFILLED_SYRINGE | INTRAVENOUS | Status: DC | PRN
  Administered 2021-05-02: 15 mg via INTRAVENOUS

## 2021-05-02 MED ORDER — PROPOFOL 10 MG/ML IV BOLUS
INTRAVENOUS | Status: DC | PRN
  Administered 2021-05-02: 100 mg via INTRAVENOUS

## 2021-05-02 MED ORDER — FENTANYL CITRATE (PF) 250 MCG/5ML IJ SOLN
INTRAMUSCULAR | Status: AC
  Filled 2021-05-02: qty 5

## 2021-05-02 MED ORDER — ACETAMINOPHEN 10 MG/ML IV SOLN: 1000.0000 mg | Freq: Once | INTRAVENOUS | Status: DC | PRN

## 2021-05-02 MED ORDER — CEFAZOLIN SODIUM-DEXTROSE 2-4 GM/100ML-% IV SOLN
2.0000 g | INTRAVENOUS | Status: AC
  Administered 2021-05-02: 2 g via INTRAVENOUS
  Filled 2021-05-02: qty 100

## 2021-05-02 MED ORDER — ROPIVACAINE HCL 7.5 MG/ML IJ SOLN
INTRAMUSCULAR | Status: DC | PRN
  Administered 2021-05-02 (×4): 5 mL via PERINEURAL

## 2021-05-02 MED ORDER — PROMETHAZINE HCL 25 MG/ML IJ SOLN: 6.2500 mg | INTRAMUSCULAR | Status: DC | PRN

## 2021-05-02 MED ORDER — PHENYLEPHRINE 40 MCG/ML (10ML) SYRINGE FOR IV PUSH (FOR BLOOD PRESSURE SUPPORT)
PREFILLED_SYRINGE | INTRAVENOUS | Status: AC
  Filled 2021-05-02: qty 10

## 2021-05-02 MED ORDER — PHENYLEPHRINE 40 MCG/ML (10ML) SYRINGE FOR IV PUSH (FOR BLOOD PRESSURE SUPPORT)
PREFILLED_SYRINGE | INTRAVENOUS | Status: DC | PRN
  Administered 2021-05-02: 120 ug via INTRAVENOUS
  Administered 2021-05-02 (×3): 80 ug via INTRAVENOUS

## 2021-05-02 MED ORDER — LACTATED RINGERS IV SOLN: INTRAVENOUS | Status: DC

## 2021-05-02 MED ORDER — HYDROMORPHONE HCL 1 MG/ML IJ SOLN: 0.2500 mg | INTRAMUSCULAR | Status: DC | PRN

## 2021-05-02 MED ORDER — ONDANSETRON HCL 4 MG/2ML IJ SOLN
INTRAMUSCULAR | Status: AC
  Filled 2021-05-02: qty 2

## 2021-05-02 MED ORDER — ORAL CARE MOUTH RINSE: 15.0000 mL | Freq: Once | OROMUCOSAL | Status: AC

## 2021-05-02 MED ORDER — ONDANSETRON HCL 4 MG/2ML IJ SOLN
INTRAMUSCULAR | Status: DC | PRN
  Administered 2021-05-02: 4 mg via INTRAVENOUS

## 2021-05-02 MED ORDER — LIDOCAINE HCL 1 % IJ SOLN
INTRAMUSCULAR | Status: DC | PRN
  Administered 2021-05-02: 10 mL

## 2021-05-02 MED ORDER — MEPERIDINE HCL 25 MG/ML IJ SOLN: 6.2500 mg | INTRAMUSCULAR | Status: DC | PRN

## 2021-05-02 MED ORDER — SODIUM CHLORIDE 0.9 % IV SOLN: INTRAVENOUS | Status: DC

## 2021-05-02 MED ORDER — CHLORHEXIDINE GLUCONATE 0.12 % MT SOLN
15.0000 mL | Freq: Once | OROMUCOSAL | Status: AC
  Administered 2021-05-02: 15 mL via OROMUCOSAL
  Filled 2021-05-02: qty 15

## 2021-05-02 MED ORDER — AMOXICILLIN-POT CLAVULANATE 875-125 MG PO TABS: 1.0000 | ORAL_TABLET | Freq: Two times a day (BID) | ORAL | 0 refills | Status: DC

## 2021-05-02 MED ORDER — MIDAZOLAM HCL 2 MG/2ML IJ SOLN
INTRAMUSCULAR | Status: AC
  Filled 2021-05-02: qty 2

## 2021-05-02 MED ORDER — 0.9 % SODIUM CHLORIDE (POUR BTL) OPTIME
TOPICAL | Status: DC | PRN
  Administered 2021-05-02: 1000 mL

## 2021-05-02 MED ORDER — PROPOFOL 500 MG/50ML IV EMUL
INTRAVENOUS | Status: DC | PRN
  Administered 2021-05-02: 75 ug/kg/min via INTRAVENOUS

## 2021-05-02 MED ORDER — LIDOCAINE 2% (20 MG/ML) 5 ML SYRINGE
INTRAMUSCULAR | Status: DC | PRN
  Administered 2021-05-02: 40 mg via INTRAVENOUS

## 2021-05-02 MED ORDER — FENTANYL CITRATE (PF) 250 MCG/5ML IJ SOLN
INTRAMUSCULAR | Status: DC | PRN
  Administered 2021-05-02: 25 ug via INTRAVENOUS
  Administered 2021-05-02: 50 ug via INTRAVENOUS

## 2021-05-02 MED ORDER — OXYCODONE-ACETAMINOPHEN 10-325 MG PO TABS: 1.0000 | ORAL_TABLET | Freq: Three times a day (TID) | ORAL | 0 refills | Status: DC | PRN

## 2021-05-02 SURGICAL SUPPLY — 31 items
BNDG ELASTIC 4X5.8 VLCR STR LF (GAUZE/BANDAGES/DRESSINGS) ×2 IMPLANT
BNDG GAUZE ELAST 4 BULKY (GAUZE/BANDAGES/DRESSINGS) ×2 IMPLANT
CANISTER SUCT 3000ML PPV (MISCELLANEOUS) ×2 IMPLANT
COVER SURGICAL LIGHT HANDLE (MISCELLANEOUS) ×2 IMPLANT
COVER WAND RF STERILE (DRAPES) ×2 IMPLANT
DRAPE EXTREMITY T 121X128X90 (DISPOSABLE) ×2 IMPLANT
DRAPE HALF SHEET 40X57 (DRAPES) ×2 IMPLANT
DRSG EMULSION OIL 3X3 NADH (GAUZE/BANDAGES/DRESSINGS) ×2 IMPLANT
DRSG TELFA 3X8 NADH (GAUZE/BANDAGES/DRESSINGS) IMPLANT
ELECT REM PT RETURN 9FT ADLT (ELECTROSURGICAL) ×2
ELECTRODE REM PT RTRN 9FT ADLT (ELECTROSURGICAL) ×1 IMPLANT
GAUZE PACKING IODOFORM 1/4X15 (PACKING) ×2 IMPLANT
GAUZE SPONGE 4X4 12PLY STRL (GAUZE/BANDAGES/DRESSINGS) ×2 IMPLANT
GLOVE BIO SURGEON STRL SZ7.5 (GLOVE) ×2 IMPLANT
GLOVE SRG 8 PF TXTR STRL LF DI (GLOVE) ×1 IMPLANT
GLOVE SURG UNDER POLY LF SZ8 (GLOVE) ×2
GOWN STRL REUS W/ TWL LRG LVL3 (GOWN DISPOSABLE) IMPLANT
GOWN STRL REUS W/ TWL XL LVL3 (GOWN DISPOSABLE) ×2 IMPLANT
GOWN STRL REUS W/TWL LRG LVL3 (GOWN DISPOSABLE)
GOWN STRL REUS W/TWL XL LVL3 (GOWN DISPOSABLE) ×4
KIT BASIN OR (CUSTOM PROCEDURE TRAY) ×2 IMPLANT
KIT TURNOVER KIT B (KITS) ×2 IMPLANT
NS IRRIG 1000ML POUR BTL (IV SOLUTION) ×2 IMPLANT
PACK GENERAL/GYN (CUSTOM PROCEDURE TRAY) ×2 IMPLANT
PAD ARMBOARD 7.5X6 YLW CONV (MISCELLANEOUS) ×4 IMPLANT
SUT ETHILON 3 0 PS 1 (SUTURE) ×2 IMPLANT
SUT VIC AB 3-0 SH 27 (SUTURE)
SUT VIC AB 3-0 SH 27X BRD (SUTURE) IMPLANT
TOWEL GREEN STERILE (TOWEL DISPOSABLE) ×4 IMPLANT
UNDERPAD 30X36 HEAVY ABSORB (UNDERPADS AND DIAPERS) ×2 IMPLANT
WATER STERILE IRR 1000ML POUR (IV SOLUTION) IMPLANT

## 2021-05-02 NOTE — Discharge Instructions (Signed)
Debridement of left fourth toe amputation site today.  Would recommend wet-to-dry dressing daily to the wound.  I have sent my office a message to arrange home health.  I will send him with 1 week of p.o. antibiotic prescription for Augmentin.  Follow-up arranged in 1 to 2 weeks in our office for wound check.

## 2021-05-02 NOTE — Anesthesia Postprocedure Evaluation (Signed)
Anesthesia Post Note  Patient: [name]  Procedure(s) Performed: Left 4th toe amputation site debridement including metatarsal head (Left )     Patient location during evaluation: PACU Anesthesia Type: Regional and MAC Level of consciousness: awake Pain management: pain level controlled Vital Signs Assessment: post-procedure vital signs reviewed and stable Respiratory status: spontaneous breathing Cardiovascular status: stable Postop Assessment: no apparent nausea or vomiting Anesthetic complications: no   No complications documented.  Last Vitals:  Vitals:   05/02/21 0910 05/02/21 0915  BP: (!) 124/40 (!) 135/33  Pulse: 88 86  Resp: 19 16  Temp:  (!) 36.4 C  SpO2: 97% 95%    Last Pain:  Vitals:   05/02/21 0915  TempSrc:   PainSc: 0-No pain                 Huston Foley

## 2021-05-02 NOTE — Anesthesia Procedure Notes (Signed)
Anesthesia Regional Block: Popliteal block   Pre-Anesthetic Checklist: ,, timeout performed, Correct Patient, Correct Site, Correct Laterality, Correct Procedure, Correct Position, site marked, Risks and benefits discussed,  Surgical consent,  Pre-op evaluation,  At surgeon's request and post-op pain management  Laterality: Lower and Left  Prep: chloraprep       Needles:  Injection technique: Single-shot  Needle Type: Echogenic Stimulator Needle     Needle Length: 9cm  Needle Gauge: 20   Needle insertion depth: 1.5 cm   Additional Needles:   Procedures:,,,, ultrasound used (permanent image in chart),,,,  Narrative:  Start time: 05/02/2021 7:20 AM End time: 05/02/2021 7:27 AM Injection made incrementally with aspirations every 5 mL.  Performed by: Personally  Anesthesiologist: Leilani Able, MD

## 2021-05-02 NOTE — Progress Notes (Signed)
Patient expressed that he is not comfortable with having the 5th toe amputated today.  Dr Chestine Spore notified.  Dr Chestine Spore stated that he did not want to do anything that the patient was not comfortable with.  Consent changed to remove the possible 5th toe amputation per dr Chestine Spore.  Dr Chestine Spore will talk to patient when he arrives this morning

## 2021-05-02 NOTE — Progress Notes (Signed)
Wife at bedside to review dressing change instructions. All questions answered and supplies sent home with family.

## 2021-05-02 NOTE — Transfer of Care (Signed)
Immediate Anesthesia Transfer of Care Note  Patient: [name]  Procedure(s) Performed: Left 4th toe amputation site debridement including metatarsal head (Left )  Patient Location: PACU  Anesthesia Type:GA combined with regional for post-op pain  Level of Consciousness: drowsy and patient cooperative  Airway & Oxygen Therapy: Patient Spontanous Breathing and Patient connected to nasal cannula oxygen  Post-op Assessment: Report given to RN and Post -op Vital signs reviewed and stable  Post vital signs: Reviewed and stable  Last Vitals:  Vitals Value Taken Time  BP 118/40 05/02/21 0847  Temp    Pulse 87 05/02/21 0849  Resp 15 05/02/21 0849  SpO2 96 % 05/02/21 0849  Vitals shown include unvalidated device data.  Last Pain:  Vitals:   05/02/21 0621  TempSrc:   PainSc: 5       Patients Stated Pain Goal: 3 (05/02/21 6789)  Complications: No complications documented.

## 2021-05-02 NOTE — Anesthesia Procedure Notes (Signed)
Procedure Name: LMA Insertion Date/Time: 05/02/2021 8:11 AM Performed by: Waynard Edwards, CRNA Pre-anesthesia Checklist: Patient identified, Emergency Drugs available, Suction available and Patient being monitored Patient Re-evaluated:Patient Re-evaluated prior to induction Oxygen Delivery Method: Circle system utilized Preoxygenation: Pre-oxygenation with 100% oxygen Induction Type: IV induction LMA: LMA inserted LMA Size: 5.0 Placement Confirmation: positive ETCO2 Dental Injury: Teeth and Oropharynx as per pre-operative assessment

## 2021-05-02 NOTE — H&P (Signed)
History and Physical Interval Note:  05/02/2021 7:47 AM  [name]  has presented today for surgery, with the diagnosis of foot ulcer.  The various methods of treatment have been discussed with the patient and family. After consideration of risks, benefits and other options for treatment, the patient has consented to  Procedure(s): Left 4th toe amputation site debridement (Left) as a surgical intervention.  The patient's history has been reviewed, patient examined, no change in status, stable for surgery.  I have reviewed the patient's chart and labs.  Questions were answered to the patient's satisfaction.    Debridement left 4th toe amputation - refusing left 5th toe amputation.  Cephus Shelling  Office Note     CC:  follow up Requesting Provider:  [facility]  HPI: [name] is a 75 y.o. ([birth year]) male who presents status post left fourth toe amputation by Dr. Randie Heinz on 03/18/2021.  He underwent an arteriogram prior to surgery however no intervention was warranted.  Patient states the wound looks worse and he has redness on his foot despite being on an antibiotic prescribed by the [place].  He has not been doing much in the way of wound care.  He also states his wife refuses to have anything to do with the wound.  He is on aspirin and statin.  He is an insulin-dependent diabetic.       Past Medical History:  Diagnosis Date  . CAD, AUTOLOGOUS BYPASS GRAFT   . Depression   . DIABETES, TYPE 2   . DYSPNEA    on exertion  . HYPERLIPIDEMIA   . OBESITY   . OBSTRUCTIVE SLEEP APNEA    cpap  . Old myocardial infarction 2005  . Peripheral vascular disease (HCC)   . Stroke [facility])    light stroke         Past Surgical History:  Procedure Laterality Date  . ABDOMINAL AORTOGRAM W/LOWER EXTREMITY Bilateral 03/10/2021   Procedure: ABDOMINAL AORTOGRAM W/LOWER EXTREMITY;  Surgeon: Maeola Harman, MD;  Location: [facility] INVASIVE CV LAB;  Service:  Cardiovascular;  Laterality: Bilateral;  . AMPUTATION Left 03/18/2021   Procedure: LEFT FOURTH TOE AMPUTATION;  Surgeon: Maeola Harman, MD;  Location: [facility] OR;  Service: Vascular;  Laterality: Left;  . APPENDECTOMY  1965  . CORONARY ARTERY BYPASS GRAFT  2008   at [place]  . cyst remove on lelt leg,posterior    . LOWER EXTREMITY ANGIOGRAPHY N/A 03/07/2018   Procedure: LOWER EXTREMITY ANGIOGRAPHY;  Surgeon: Maeola Harman, MD;  Location: [facility] INVASIVE CV LAB;  Service: Cardiovascular;  Laterality: N/A;  . LOWER EXTREMITY ANGIOGRAPHY Bilateral 05/29/2019   Procedure: LOWER EXTREMITY ANGIOGRAPHY;  Surgeon: Maeola Harman, MD;  Location: [facility] INVASIVE CV LAB;  Service: Cardiovascular;  Laterality: Bilateral;  . PERIPHERAL VASCULAR ATHERECTOMY Right 03/07/2018   Procedure: PERIPHERAL VASCULAR ATHERECTOMY;  Surgeon: Maeola Harman, MD;  Location: [facility] INVASIVE CV LAB;  Service: Cardiovascular;  Laterality: Right;  anterioer tibial  . VASECTOMY      Social History        Socioeconomic History  . Marital status: Married    Spouse name: Not on file  . Number of children: Not on file  . Years of education: Not on file  . Highest education level: Not on file  Occupational History  . Not on file  Tobacco Use  . Smoking status: Former Smoker    Types: Pipe  . Smokeless tobacco: Never Used  Vaping Use  .  Vaping Use: Never used  Substance and Sexual Activity  . Alcohol use: Never  . Drug use: Never  . Sexual activity: Not on file  Other Topics Concern  . Not on file  Social History Narrative  . Not on file   Social Determinants of Health   Financial Resource Strain: Not on file  Food Insecurity: Not on file  Transportation Needs: Not on file  Physical Activity: Not on file  Stress: Not on file  Social Connections: Not on file  Intimate Partner Violence: Not on file         Family History  Problem Relation Age of Onset   . Diabetes Mother   . Alcoholism Father           Current Outpatient Medications  Medication Sig Dispense Refill  . acetaminophen (TYLENOL) 500 MG tablet Take 1,500 mg by mouth 2 (two) times daily as needed (pain).    Marland Kitchen amoxicillin-clavulanate (AUGMENTIN) 875-125 MG tablet Take 1 tablet by mouth 2 (two) times daily. (Patient not taking: Reported on 04/08/2021) 20 tablet 0  . aspirin EC 325 MG tablet Take 325 mg by mouth in the morning.    Marland Kitchen atorvastatin (LIPITOR) 40 MG tablet Take 20 mg by mouth in the morning.    . Charcoal Activated (ACTIVATED CHARCOAL PO) Take 520 mg by mouth in the morning and at bedtime.    . Cholecalciferol 2000 units TABS Take 2,000 Units by mouth in the morning.    . citalopram (CELEXA) 40 MG tablet Take 20 mg by mouth in the morning.    . clopidogrel (PLAVIX) 75 MG tablet Take 75 mg by mouth in the morning.    . Coenzyme Q10 (COQ-10) 200 MG CAPS Take 200 mg by mouth in the morning.    . fluorouracil (EFUDEX) 5 % cream Apply 1 application topically 2 (two) times daily as needed (cancerous flares).     . furosemide (LASIX) 40 MG tablet Take 40 mg by mouth in the morning.    Chilton Si Tea, Camellia sinensis, (GREEN TEA EXTRACT PO) Take 2 tablets by mouth 2 (two) times a day.    . insulin aspart protamine- aspart (NOVOLOG MIX 70/30) (70-30) 100 UNIT/ML injection Inject 30 Units into the skin in the morning and at bedtime.    . Melatonin-Theanine (MELATONIN FORTE/L-THEANINE PO) Take 2 tablets by mouth at bedtime. OLLY Sleep gummies    . metFORMIN (GLUCOPHAGE) 1000 MG tablet Take 1,000 mg by mouth in the morning and at bedtime.    . metoprolol tartrate (LOPRESSOR) 50 MG tablet Take 50 mg by mouth 2 (two) times daily.    . Misc Natural Products (NEURIVA) CAPS Take 1 capsule by mouth at bedtime.    . Multiple Vitamin (MULTIVITAMIN WITH MINERALS) TABS tablet Take 1 tablet by mouth in the morning, at noon, and at bedtime. Men's 50+    .  nitroGLYCERIN (NITROSTAT) 0.4 MG SL tablet Place 0.4 mg under the tongue every 5 (five) minutes x 3 doses as needed for chest pain.    Marland Kitchen OVER THE COUNTER MEDICATION Take 1 tablet by mouth 3 (three) times daily with meals. Sugar Blockers    . oxyCODONE-acetaminophen (PERCOCET) 10-325 MG tablet Take 1 tablet by mouth every 6 (six) hours as needed for pain. (Patient not taking: Reported on 04/08/2021) 20 tablet 0  . Resveratrol 250 MG CAPS Take 250 mg by mouth daily with lunch.    . Semaglutide, 1 MG/DOSE, (OZEMPIC, 1 MG/DOSE,) 4 MG/3ML SOPN Inject  1 mg into the skin every Monday.    . traZODone (DESYREL) 50 MG tablet Take 25 mg by mouth at bedtime.      No current facility-administered medications for this visit.         Allergies  Allergen Reactions  . Ace Inhibitors     Heart attack   . Lisinopril     Heart attack     REVIEW OF SYSTEMS:   [X]  denotes positive finding, [ ]  denotes negative finding Cardiac  Comments:  Chest pain or chest pressure:    Shortness of breath upon exertion:    Short of breath when lying flat:    Irregular heart rhythm:        Vascular    Pain in calf, thigh, or hip brought on by ambulation:    Pain in feet at night that wakes you up from your sleep:     Blood clot in your veins:    Leg swelling:         Pulmonary    Oxygen at home:    Productive cough:     Wheezing:         Neurologic    Sudden weakness in arms or legs:     Sudden numbness in arms or legs:     Sudden onset of difficulty speaking or slurred speech:    Temporary loss of vision in one eye:     Problems with dizziness:         Gastrointestinal    Blood in stool:     Vomited blood:         Genitourinary    Burning when urinating:     Blood in urine:        Psychiatric    Major depression:         Hematologic    Bleeding problems:    Problems with blood clotting too  easily:        Skin    Rashes or ulcers:        Constitutional    Fever or chills:      PHYSICAL EXAMINATION:     Vitals:   04/22/21 0951  BP: 110/70  Pulse: 84  Resp: 20  Temp: 97.9 F (36.6 C)  TempSrc: Temporal  SpO2: 96%  Weight: 224 lb 12.8 oz (102 kg)  Height: 5\' 8"  (1.727 m)    General:  WDWN in NAD; vital signs documented above Gait: Not observed HENT: WNL, normocephalic Pulmonary: normal non-labored breathing Cardiac: regular HR Abdomen: soft, NT, no masses Skin: without rashes Vascular Exam/Pulses: Brisk left DP and PT by Doppler Extremities: Left fourth toe amp site with fibrinous exudate with erythema onto the dorsal foot; there is also an odor Musculoskeletal: no muscle wasting or atrophy       Neurologic: A&O X 3;  No focal weakness or paresthesias are detected Psychiatric:  The pt has Normal affect.    ASSESSMENT/PLAN:: 75 y.o. male here for recheck of left fourth toe amputation site  -Left foot is warm with a brisk DP and PT Doppler signal on exam -Despite the above the left fourth toe amputation site does not appear to be healing; and now has an odor with erythema spreading onto the dorsal foot -Amputation site was debrided as much as the patient can tolerate in the exam room -Plan will be for return to the operating room for debridement of surgical site and possible fifth toe amputation -In the meantime patient will cleanse the  wound with soap and water twice daily; he will also continue his antibiotic to completion -Surgery was discussed with the patient and he agrees to proceed   Emilie RutterMatthew Eveland, PA-C [facility] [phone]  Clinic MD:   Chestine Sporelark

## 2021-05-02 NOTE — Op Note (Signed)
Date: May 02, 2021  Preoperative diagnosis: Nonhealing left fourth toe amputation wound  Postoperative diagnosis: Same  Procedure: Debridement of left fourth toe amputation including metatarsal head  Surgeon: Dr. Cephus Shelling, MD  Assistant: OR staff  Indications: Patient is a 75 year old male who recently underwent left lower extremity arteriogram with Dr. Randie Heinz and then a left fourth toe amputation on 03/18/2021.  He was recently seen in the [facility] clinic with a nonhealing left 4th toe amputation and presents today for debridement of the toe amputation.  He was also posted for possible additional 5th toe amputation but today is refusing any additional toe amputation except for fourth toe debridement.  Risk benefits discussed.  Findings: The left fourth toe amputation wound was debrided with a rongeur back to healthy appearing tissue with bleeding and this included taking some of the metatarsal head.  A wet to dry dressing was placed.  Anesthesia: LMA  Details: Patient was taken to the operating room after informed consent was obtained.  Placed on the operative table in the supine position.  The left foot was prepped and draped in the usual sterile fashion.  Timeout was performed.  I initially used 10 cc of 1% lidocaine without epinephrine and a left 4th toe digital block was placed on the left fourth toe amputation site.  I then used a rongeur and debrided back all the subcutaneous tissue including fat in the left fourth toe amputation site until I got to bleeding viable appearing tissue.  This included resecting the head of the fourth metatarsal.  The wound was then irrigated and packed with wet-to-dry dressing and dry sterile dressings were applied.  Complication: None  Condition: Stable  Cephus Shelling, MD [facility] [place] Office: [phone]   Cephus Shelling

## 2021-05-02 NOTE — Anesthesia Procedure Notes (Signed)
Procedure Name: MAC Date/Time: 05/02/2021 7:59 AM Performed by: Colin Benton, CRNA Pre-anesthesia Checklist: Patient identified, Emergency Drugs available, Suction available and Patient being monitored Patient Re-evaluated:Patient Re-evaluated prior to induction Oxygen Delivery Method: Nasal cannula Induction Type: IV induction Placement Confirmation: positive ETCO2 Dental Injury: Teeth and Oropharynx as per pre-operative assessment

## 2021-05-03 ENCOUNTER — Encounter (HOSPITAL_COMMUNITY): Payer: Self-pay | Admitting: Vascular Surgery

## 2021-05-08 ENCOUNTER — Telehealth: Payer: Self-pay | Admitting: *Deleted

## 2021-05-08 ENCOUNTER — Other Ambulatory Visit: Payer: Self-pay

## 2021-05-08 ENCOUNTER — Ambulatory Visit (INDEPENDENT_AMBULATORY_CARE_PROVIDER_SITE_OTHER): Payer: Medicare Other | Admitting: Vascular Surgery

## 2021-05-08 DIAGNOSIS — I739 Peripheral vascular disease, unspecified: Secondary | ICD-10-CM

## 2021-05-08 NOTE — Telephone Encounter (Signed)
Patient wife called stating she thought they were getting Home Health for dsg changes. Patient has [employer] insurance and referral has been put in for wound care center. Explained to wife that wound care center is not Home Health. She states she has been changing his dressing daily and she had received a call from wound care she just misunderstood and thought it was supposed to be Home Health. She will continue dressing changes and follow with wound care center.

## 2021-05-08 NOTE — Progress Notes (Signed)
Patient came in for instructions on his dressing to left fourth toe amputation site.  I instructed patient to cleanse the area, pack gently with saline gauze and wrap with kerlix.  I gave him some supplies to enable the dressing to be lighter and to better stay in place.

## 2021-05-13 ENCOUNTER — Ambulatory Visit (INDEPENDENT_AMBULATORY_CARE_PROVIDER_SITE_OTHER): Payer: No Typology Code available for payment source | Admitting: Physician Assistant

## 2021-05-13 ENCOUNTER — Other Ambulatory Visit: Payer: Self-pay

## 2021-05-13 VITALS — BP 123/59 | HR 87 | Temp 98.6°F | Resp 20 | Ht 68.0 in | Wt 221.8 lb

## 2021-05-13 DIAGNOSIS — I739 Peripheral vascular disease, unspecified: Secondary | ICD-10-CM

## 2021-05-13 MED ORDER — SULFAMETHOXAZOLE-TRIMETHOPRIM 400-80 MG PO TABS: 1.0000 | ORAL_TABLET | Freq: Two times a day (BID) | ORAL | 0 refills | Status: DC

## 2021-05-13 NOTE — Progress Notes (Signed)
    Postoperative Visit    History of Present Illness   [name] is a 75 y.o. male who presents for postoperative follow-up after debridement of non healing L 4th toe amp site by Dr. Chestine Spore 05/02/21.  He underwent an arteriogram 03/10/21 however no intervention was warranted.  He has a small ulcer on 5th toe tip and 2nd toe tip ulcer.  He is wearing his darco shoe.  He is cleansing wounds with soap and water daily.  He denies fevers, chills, N/V.  For VQI Use Only   PRE-ADM LIVING: Home  AMB STATUS: Ambulatory   Physical Examination   Vitals:   05/13/21 1519  BP: (!) 123/59  Pulse: 87  Resp: 20  Temp: 98.6 F (37 C)  SpO2: 96%    LLE: brisk DP and PT signal by doppler; 4th toe amp site clean with granulation tissue; pressure wound 2nd toe and 5th toe tip       Medical Decision Making   [name] is a 75 y.o. male who presents for wound check of 4th toe amp site debridement   L foot seemingly well perfused with brisk PT and DP signal by doppler  Now with pressure sore of 5th toe tip and 2nd toe tip; recommended avoiding any pressure to these areas.  No indication for further amputation currently.  Prescibed 10 days of Bactrim.  Re-dressed with wet to dry  Continue cleansing with soap and water.  Recheck wounds in 2 weeks.  [name] PA-C [facility] [place] Office: [phone]  Clinic MD: Chestine Spore

## 2021-05-27 ENCOUNTER — Other Ambulatory Visit: Payer: Self-pay

## 2021-05-27 ENCOUNTER — Ambulatory Visit (INDEPENDENT_AMBULATORY_CARE_PROVIDER_SITE_OTHER): Payer: No Typology Code available for payment source | Admitting: Physician Assistant

## 2021-05-27 ENCOUNTER — Encounter: Payer: Self-pay | Admitting: Physician Assistant

## 2021-05-27 VITALS — BP 98/58 | HR 80 | Temp 97.8°F | Resp 20 | Ht 68.0 in | Wt 219.8 lb

## 2021-05-27 DIAGNOSIS — I739 Peripheral vascular disease, unspecified: Secondary | ICD-10-CM

## 2021-05-27 NOTE — Progress Notes (Signed)
POST OPERATIVE OFFICE NOTE    CC:  F/u for surgery  HPI:  This is a 75 y.o. male who is s/p debridement of left fourth toe amputation including metatarsal head by Dr. Chestine Spore on 05/02/21. This was done secondary to non healing 4th amputation wound. He had previously undergone arteriogram on 03/10/21 but no intervention was indicated. He has had small ulcers present on the 2nd and 5th toes. These overall are unchanged. At time of his last visit he was started on Bactrim. He completed the full course. He reports no real change in how foot feels or looks following taking the antibiotics. He continues to report constant aching pain with intermittent sharp pains in the forefoot. He is taking 1500 mg of Tylenol a day for the pain. He has been doing the wound care by herself with soap and water and applying wet to dry dressings daily. HH was suppose to be arranged but there were issues with setting this up and coverage. He denies any fever or chills  Allergies  Allergen Reactions  . Ace Inhibitors     Heart attack   . Lisinopril     Heart attack    Current Outpatient Medications  Medication Sig Dispense Refill  . acetaminophen (TYLENOL) 500 MG tablet Take 1,500 mg by mouth 2 (two) times daily as needed (pain).    Marland Kitchen amoxicillin-clavulanate (AUGMENTIN) 875-125 MG tablet Take 1 tablet by mouth 2 (two) times daily. 14 tablet 0  . aspirin EC 325 MG tablet Take 325 mg by mouth in the morning.    Marland Kitchen atorvastatin (LIPITOR) 40 MG tablet Take 40 mg by mouth in the morning.    . Charcoal Activated (ACTIVATED CHARCOAL PO) Take 520 mg by mouth in the morning and at bedtime.    . Cholecalciferol 2000 units TABS Take 2,000 Units by mouth in the morning.    . citalopram (CELEXA) 40 MG tablet Take 20 mg by mouth in the morning.    . clopidogrel (PLAVIX) 75 MG tablet Take 75 mg by mouth in the morning.    . Coenzyme Q10 (COQ-10) 200 MG CAPS Take 200 mg by mouth in the morning.    . fluorouracil (EFUDEX) 5 % cream  Apply 1 application topically daily.    . furosemide (LASIX) 40 MG tablet Take 40 mg by mouth in the morning.    Chilton Si Tea, Camellia sinensis, (GREEN TEA EXTRACT PO) Take 800 mg by mouth 2 (two) times a day. 400 mg each    . insulin aspart protamine- aspart (NOVOLOG MIX 70/30) (70-30) 100 UNIT/ML injection Inject 30 Units into the skin in the morning and at bedtime.    . Melatonin-Theanine (MELATONIN FORTE/L-THEANINE PO) Take 2 tablets by mouth at bedtime. OLLY Sleep gummies    . metFORMIN (GLUCOPHAGE) 1000 MG tablet Take 1,000 mg by mouth in the morning and at bedtime.    . metoprolol tartrate (LOPRESSOR) 50 MG tablet Take 50 mg by mouth 2 (two) times daily.    . Misc Natural Products (NEURIVA) CAPS Take 1 capsule by mouth at bedtime.    . Multiple Vitamin (MULTIVITAMIN WITH MINERALS) TABS tablet Take 1 tablet by mouth in the morning, at noon, and at bedtime. Men's 50+    . nitroGLYCERIN (NITROSTAT) 0.4 MG SL tablet Place 0.4 mg under the tongue every 5 (five) minutes x 3 doses as needed for chest pain.    Marland Kitchen OVER THE COUNTER MEDICATION Take 1 tablet by mouth 3 (three) times daily with  meals. Sugar Blockers    . OVER THE COUNTER MEDICATION 5 sprays by Mouth Rinse route daily. Mouth coat    . oxyCODONE-acetaminophen (PERCOCET) 10-325 MG tablet Take 1 tablet by mouth every 8 (eight) hours as needed for pain. 15 tablet 0  . Resveratrol 250 MG CAPS Take 250 mg by mouth daily with lunch.    . Semaglutide, 1 MG/DOSE, (OZEMPIC, 1 MG/DOSE,) 4 MG/3ML SOPN Inject 1 mg into the skin every Monday.    . sulfamethoxazole-trimethoprim (BACTRIM) 400-80 MG tablet Take 1 tablet by mouth 2 (two) times daily. 20 tablet 0  . traZODone (DESYREL) 50 MG tablet Take 25 mg by mouth at bedtime.      No current facility-administered medications for this visit.     ROS:  See HPI  Physical Exam:  Vitals:   05/27/21 1159  BP: (!) 98/58  Pulse: 80  Resp: 20  Temp: 97.8 F (36.6 C)  TempSrc: Temporal  SpO2: 98%   Weight: 219 lb 12.8 oz (99.7 kg)  Height: 5\' 8"  (1.727 m)   General: well appearing, well nourished, not in any distress Incision:  4th toe amputation site with some slough in wound bed otherwise okay appearing. 5th toe and 2nd toe ulcerations stable.    Extremities:  Well perfused and warm. Biphasic doppler AT/Dp/ pero signals Neuro: alert and oriented Abdomen: distended, soft, non tender  Assessment/Plan:  This is a 75 y.o. male who is s/p debridement of left fourth toe amputation including metatarsal head by Dr. 65 on 05/02/21. The wound is doing okay. Clinically his left lower extremity is well perfused with doppler PT/DP and Peroneal signals - Re discussed importance of protecting his feet and walking with Darco shoe - Continue daily wet to dry dressing changes - discussed importance of adequate blood sugar control  - Will have HH with [facility] arranged to assist with wound care - he will follow up in 3 weeks for wound check   05/04/21, PA-C [facility] [phone]  Clinic MD:  [phone] / Chestine Spore

## 2021-05-28 ENCOUNTER — Encounter (HOSPITAL_BASED_OUTPATIENT_CLINIC_OR_DEPARTMENT_OTHER): Payer: Medicare Other | Attending: Internal Medicine | Admitting: Physician Assistant

## 2021-05-28 ENCOUNTER — Other Ambulatory Visit (HOSPITAL_COMMUNITY)
Admission: RE | Admit: 2021-05-28 | Discharge: 2021-05-28 | Disposition: A | Discharge status: Home / Self Care | Payer: Medicare Other | Source: Other Acute Inpatient Hospital | Attending: Physician Assistant | Admitting: Physician Assistant

## 2021-05-28 DIAGNOSIS — E1151 Type 2 diabetes mellitus with diabetic peripheral angiopathy without gangrene: Secondary | ICD-10-CM | POA: Insufficient documentation

## 2021-05-28 DIAGNOSIS — L97528 Non-pressure chronic ulcer of other part of left foot with other specified severity: Secondary | ICD-10-CM | POA: Diagnosis not present

## 2021-05-28 DIAGNOSIS — E11621 Type 2 diabetes mellitus with foot ulcer: Secondary | ICD-10-CM | POA: Diagnosis present

## 2021-05-28 DIAGNOSIS — Z888 Allergy status to other drugs, medicaments and biological substances status: Secondary | ICD-10-CM | POA: Diagnosis not present

## 2021-05-28 DIAGNOSIS — L089 Local infection of the skin and subcutaneous tissue, unspecified: Secondary | ICD-10-CM | POA: Insufficient documentation

## 2021-05-28 NOTE — Progress Notes (Signed)
[name] ([id number]) Visit Report for 05/28/2021 Abuse/Suicide Risk Screen Details Patient Name: Date of Service: [name], [name] 05/28/2021 9:00 A M Medical Record Number: [id number] Patient Account Number: [id number] Date of Birth/Sex: Treating RN: 1946/04/20 (75 y.o. Male) Zandra Abts Primary Care Juliany Daughety: [facility] Other Clinician: Referring Bridgett Hattabaugh: Treating Aanchal Cope/Extender: Tomasita Crumble in Treatment: 0 Abuse/Suicide Risk Screen Items Answer ABUSE RISK SCREEN: Has anyone close to you tried to hurt or harm you recentlyo No Do you feel uncomfortable with anyone in your familyo No Has anyone forced you do things that you didnt want to doo No Electronic Signature(s) Signed: 05/28/2021 5:57:05 PM By: Zandra Abts RN, BSN Entered By: Zandra Abts on 05/28/2021 10:30:50 -------------------------------------------------------------------------------- Activities of Daily Living Details Patient Name: Date of Service: [name] 05/28/2021 9:00 A M Medical Record Number: [id number] Patient Account Number: [id number] Date of Birth/Sex: Treating RN: 1946-03-27 (75 y.o. Male) Zandra Abts Primary Care Daana Petrasek: [facility] Other Clinician: Referring Aanvi Voyles: Treating Norton Bivins/Extender: Tomasita Crumble in Treatment: 0 Activities of Daily Living Items Answer Activities of Daily Living (Please select one for each item) Drive Automobile Completely Able T Medications ake Completely Able Use T elephone Completely Able Care for Appearance Completely Able Use T oilet Completely Able Bath / Shower Completely Able Dress Self Completely Able Feed Self Completely Able Walk Completely Able Get In / Out Bed Completely Able Housework Completely Able Prepare Meals Completely Able Handle Money Completely Able Shop for Self Completely Able Electronic Signature(s) Signed: 05/28/2021 5:57:05 PM By: Zandra Abts  RN, BSN Entered By: Zandra Abts on 05/28/2021 10:31:05 -------------------------------------------------------------------------------- Education Screening Details Patient Name: Date of Service: [name] 05/28/2021 9:00 A M Medical Record Number: [id number] Patient Account Number: [id number] Date of Birth/Sex: Treating RN: 1946/01/19 (75 y.o. Male) Zandra Abts Primary Care Lillyonna Armstead: [facility] Other Clinician: Referring Ammara Raj: Treating Manvi Guilliams/Extender: Tomasita Crumble in Treatment: 0 Primary Learner Assessed: Patient Learning Preferences/Education Level/Primary Language Learning Preference: Explanation, Demonstration, Printed Material Highest Education Level: High School Preferred Language: English Cognitive Barrier Language Barrier: No Translator Needed: No Memory Deficit: No Emotional Barrier: No Cultural/Religious Beliefs Affecting Medical Care: No Physical Barrier Impaired Vision: No Impaired Hearing: No Decreased Hand dexterity: No Knowledge/Comprehension Knowledge Level: High Comprehension Level: High Ability to understand written instructions: High Ability to understand verbal instructions: High Motivation Anxiety Level: Calm Cooperation: Cooperative Education Importance: Acknowledges Need Interest in Health Problems: Asks Questions Perception: Coherent Willingness to Engage in Self-Management High Activities: Readiness to Engage in Self-Management High Activities: Electronic Signature(s) Signed: 05/28/2021 5:57:05 PM By: Zandra Abts RN, BSN Entered By: Zandra Abts on 05/28/2021 10:31:28 -------------------------------------------------------------------------------- Fall Risk Assessment Details Patient Name: Date of Service: [name] 05/28/2021 9:00 A M Medical Record Number: [id number] Patient Account Number: [id number] Date of Birth/Sex: Treating RN: 11-15-1946 (75 y.o. Male) Zandra Abts Primary Care Toriana Sponsel: [facility] Other Clinician: Referring Antoinette Haskett: Treating Priya Matsen/Extender: Tomasita Crumble in Treatment: 0 Fall Risk Assessment Items Have you had 2 or more falls in the last 12 monthso 0 No Have you had any fall that resulted in injury in the last 12 monthso 0 No FALLS RISK SCREEN History of falling - immediate or within 3 months 0 No Secondary diagnosis (Do you have 2 or more medical diagnoseso) 15 Yes Ambulatory aid None/bed rest/wheelchair/nurse 0 Yes Crutches/cane/walker 0 No Furniture 0 No Intravenous therapy Access/Saline/Heparin Lock 0  No Gait/Transferring Normal/ bed rest/ wheelchair 0 Yes Weak (short steps with or without shuffle, stooped but able to lift head while walking, may seek 0 No support from furniture) Impaired (short steps with shuffle, may have difficulty arising from chair, head down, impaired 0 No balance) Mental Status Oriented to own ability 0 Yes Electronic Signature(s) Signed: 05/28/2021 5:57:05 PM By: Zandra Abts RN, BSN Entered By: Zandra Abts on 05/28/2021 10:31:47 -------------------------------------------------------------------------------- Foot Assessment Details Patient Name: Date of Service: [name] 05/28/2021 9:00 A M Medical Record Number: [id number] Patient Account Number: [id number] Date of Birth/Sex: Treating RN: [birth year] (75 y.o. Male) Zandra Abts Primary Care Raneshia Derick: [facility] Other Clinician: Referring Maizy Davanzo: Treating Mayerli Kirst/Extender: Tomasita Crumble in Treatment: 0 Foot Assessment Items Site Locations + = Sensation present, - = Sensation absent, C = Callus, U = Ulcer R = Redness, W = Warmth, M = Maceration, PU = Pre-ulcerative lesion F = Fissure, S = Swelling, D = Dryness Assessment Right: Left: Other Deformity: No No Prior Foot Ulcer: No No Prior Amputation: No No Charcot Joint: No No Ambulatory Status:  Ambulatory Without Help Gait: Steady Electronic Signature(s) Signed: 05/28/2021 5:57:05 PM By: Zandra Abts RN, BSN Entered By: Zandra Abts on 05/28/2021 10:32:44 -------------------------------------------------------------------------------- Nutrition Risk Screening Details Patient Name: Date of Service: [name] 05/28/2021 9:00 A M Medical Record Number: [id number] Patient Account Number: [id number] Date of Birth/Sex: Treating RN: [birth year] (75 y.o. Male) Zandra Abts Primary Care Roxy Filler: [facility] Other Clinician: Referring Lorriann Hansmann: Treating Ulonda Klosowski/Extender: Tomasita Crumble in Treatment: 0 Height (in): 68 Weight (lbs): 214 Body Mass Index (BMI): 32.5 Nutrition Risk Screening Items Score Screening NUTRITION RISK SCREEN: I have an illness or condition that made me change the kind and/or amount of food I eat 2 Yes I eat fewer than two meals per day 0 No I eat few fruits and vegetables, or milk products 0 No I have three or more drinks of beer, liquor or wine almost every day 0 No I have tooth or mouth problems that make it hard for me to eat 0 No I don't always have enough money to buy the food I need 0 No I eat alone most of the time 0 No I take three or more different prescribed or over-the-counter drugs a day 1 Yes Without wanting to, I have lost or gained 10 pounds in the last six months 0 No I am not always physically able to shop, cook and/or feed myself 0 No Nutrition Protocols Good Risk Protocol Moderate Risk Protocol 0 Provide education on nutrition High Risk Proctocol Risk Level: Moderate Risk Score: 3 Electronic Signature(s) Signed: 05/28/2021 5:57:05 PM By: Zandra Abts RN, BSN Entered By: Zandra Abts on 05/28/2021 10:31:57

## 2021-05-28 NOTE — Progress Notes (Signed)
[name], [name] B. ([id number]) Visit Report for 05/28/2021 Chief Complaint Document Details Patient Name: Date of Service: [name] PorchRUSSELL, [name] M B. 05/28/2021 9:00 A M Medical Record Number: [id number] Patient Account Number: [id number] Date of Birth/Sex: Treating RN: 09-06-1946 (74 y.o. Male) Zenaida [name], [name] Primary Care Provider: [facility] Other Clinician: Referring Provider: Treating Provider/Extender: Tomasita CrumbleStone III, [name], [name] in Treatment: 0 Information Obtained from: Patient Chief Complaint Left foot ulcers Electronic Signature(s) Signed: 05/28/2021 10:56:35 AM By: Lenda KelpStone III, Beyounce Dickens PA-C Entered By: Lenda KelpStone III, Tajanae Guilbault on 05/28/2021 10:56:35 -------------------------------------------------------------------------------- Debridement Details Patient Name: Date of Service: [name] PorchUSSELL, [name] M B. 05/28/2021 9:00 A M Medical Record Number: [id number] Patient Account Number: [id number] Date of Birth/Sex: Treating RN: 09-06-1946 (74 y.o. Male) Zenaida [name], [name] Primary Care Provider: [facility] Other Clinician: Referring Provider: Treating Provider/Extender: Tomasita CrumbleStone III, Timberlyn Pickford [name], [name] in Treatment: 0 Debridement Performed for Assessment: Wound #3 Left T Fifth oe Performed By: Physician Lenda KelpStone III, [name] Boeder, PA Debridement Type: Debridement Severity of Tissue Pre Debridement: Fat layer exposed Level of Consciousness (Pre-procedure): Awake and Alert Pre-procedure Verification/Time Out Yes - 11:05 Taken: Start Time: 11:05 Pain Control: Other : benzocaine 20% spray T Area Debrided (L x W): otal 0.5 (cm) x 0.5 (cm) = 0.25 (cm) Tissue and other material debrided: Viable, Non-Viable, Eschar, Subcutaneous, Skin: Epidermis Level: Skin/Subcutaneous Tissue Debridement Description: Excisional Instrument: Curette Bleeding: Minimum Hemostasis Achieved: Pressure End Time: 11:08 Procedural Pain: 7 Post Procedural Pain: 3 Response to Treatment: Procedure was tolerated  well Level of Consciousness (Post- Awake and Alert procedure): Post Debridement Measurements of Total Wound Length: (cm) 0.5 Width: (cm) 0.5 Depth: (cm) 0.1 Volume: (cm) 0.02 Character of Wound/Ulcer Post Debridement: Improved Severity of Tissue Post Debridement: Fat layer exposed Post Procedure Diagnosis Same as Pre-procedure Electronic Signature(s) Signed: 05/28/2021 5:40:25 PM By: Lenda KelpStone III, [name] Roepke PA-C Signed: 05/28/2021 6:12:56 PM By: Zenaida [name], Linda RN, BSN Entered By: Zenaida [name], [name] on 05/28/2021 11:08:06 -------------------------------------------------------------------------------- Debridement Details Patient Name: Date of Service: [name] PorchUSSELL, [name] M B. 05/28/2021 9:00 A M Medical Record Number: [id number] Patient Account Number: [id number] Date of Birth/Sex: Treating RN: 09-06-1946 36(74 y.o. Male) Zenaida [name], [name] Primary Care Provider: [facility] Other Clinician: Referring Provider: Treating Provider/Extender: Tomasita CrumbleStone III, Izella Ybanez [name], [name] in Treatment: 0 Debridement Performed for Assessment: Wound #2 Left,Dorsal T Second oe Performed By: Physician Lenda KelpStone III, [name] Bustamante, PA Debridement Type: Debridement Severity of Tissue Pre Debridement: Fat layer exposed Level of Consciousness (Pre-procedure): Awake and Alert Pre-procedure Verification/Time Out Yes - 11:05 Taken: Start Time: 11:05 Pain Control: Other : benzocaine 20% spray T Area Debrided (L x W): otal 0.6 (cm) x 0.7 (cm) = 0.42 (cm) Tissue and other material debrided: Viable, Non-Viable, Eschar, Slough, Subcutaneous, Skin: Epidermis, Slough Level: Skin/Subcutaneous Tissue Debridement Description: Excisional Instrument: Curette Bleeding: Minimum Hemostasis Achieved: Pressure End Time: 11:08 Procedural Pain: 9 Post Procedural Pain: 3 Response to Treatment: Procedure was tolerated well Level of Consciousness (Post- Awake and Alert procedure): Post Debridement Measurements of Total Wound Length: (cm)  0.6 Width: (cm) 0.7 Depth: (cm) 0.1 Volume: (cm) 0.033 Character of Wound/Ulcer Post Debridement: Improved Severity of Tissue Post Debridement: Fat layer exposed Post Procedure Diagnosis Same as Pre-procedure Electronic Signature(s) Signed: 05/28/2021 5:40:25 PM By: Lenda KelpStone III, [name] Borjon PA-C Signed: 05/28/2021 6:12:56 PM By: Zenaida [name], Linda RN, BSN Entered By: Zenaida [name], [name] on 05/28/2021 11:09:04 -------------------------------------------------------------------------------- HPI Details Patient Name: Date of Service: [name] PorchUSSELL, [name] M B. 05/28/2021 9:00 A M Medical Record Number: [id number] Patient Account Number: [id number] Date of Birth/Sex: Treating RN:  1946-12-09 (75 y.o. Male) Zenaida Deed Primary Care Provider: [facility] Other Clinician: Referring Provider: Treating Provider/Extender: Tomasita Crumble in Treatment: 0 History of Present Illness HPI Description: 05/28/2021 upon evaluation today patient appears to be doing somewhat poorly in regard to wounds on his foot. He has really 3 main wounds noted. This is the surgical site which is the amputation of the fourth toe of the left foot. He also has a wound on the dorsal second toe of the left foot and a wound on the dorsal fifth toe of the left foot. 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 With that being said he did seem to have enough blood flow to support a light debridement which I think will benefit him as far as trying to get the areas to heal more effectively. 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 Subsequently due to necrotic tissue and otherwise he had to be taken back to  surgery on May 13 for surgical debridement. Unfortunately he states he is just really not improving the way that he would like to see. He does have a little bit of erythema and though there could be some cellulitis I may put him on an antibiotic for this. It sounds as if there is really not much from a vascular standpoint that can be done at this point for the patient. Nonetheless I think that from a purely wound care standpoint we may be able to get these wounds healed but I think it would definitely take a bit more time than it were not for a patient with more significant and appropriate blood flow. Electronic Signature(s) Signed: 05/28/2021 12:35:16 PM By: Lenda Kelp PA-C Entered By: Lenda Kelp on 05/28/2021 12:35:16 -------------------------------------------------------------------------------- Physical Exam Details Patient Name: Date of Service: RIHAN, SCHUELER 05/28/2021 9:00 A M Medical Record Number: [id number] Patient Account Number: [id number] Date of Birth/Sex: Treating RN: 11-06-1946 (74 y.o. Male) Zenaida Deed Primary Care Provider: [facility] Other Clinician: Referring Provider: Treating Provider/Extender: Tomasita Crumble in Treatment: 0 Constitutional sitting or standing blood pressure is within target range for patient.. pulse regular and within target range for patient.Marland Kitchen respirations regular, non-labored and within target range for patient.Marland Kitchen temperature within target range for patient.. Well-nourished and well-hydrated in no acute distress. Eyes conjunctiva clear no eyelid edema noted. pupils equal round and reactive to light and accommodation. Ears, Nose, Mouth, and Throat no gross abnormality of ear auricles or external auditory canals. normal hearing noted during conversation. mucus membranes moist. Respiratory normal breathing without difficulty. Cardiovascular Absent posterior tibial and dorsalis pedis pulses bilateral lower  extremities. no clubbing, cyanosis, significant edema, <3 sec cap refill. Musculoskeletal normal gait and posture. no significant deformity or arthritic changes, no loss or range of motion, no clubbing. Psychiatric this patient is able to make decisions and demonstrates good insight into disease process. Alert and Oriented x 3. pleasant and cooperative. Notes Upon inspection patient's wound bed actually showed signs of good granulation epithelization at this point in regard to the main surgical wound although there was some necrotic tissue in the base of the wound  I am a little skeptical to going to do a lot of surgical debridement here. With that being said I did recommend for the patient based on what I am seeing that we go ahead and debride the 2 toe ulcerations to try to clear away some of the eschar so that the Santyl can work better on the areas. He was actually in agreement with that plan. Therefore we did perform debridement today he had some discomfort but was able to tolerate which was great. Post debridement the wound beds appear to be doing better I still think Santyl is a good option for him but we will see how things do in this regard. Electronic Signature(s) Signed: 05/28/2021 12:38:24 PM By: Lenda Kelp PA-C Entered By: Lenda Kelp on 05/28/2021 12:38:23 -------------------------------------------------------------------------------- Physician Orders Details Patient Name: Date of Service: [name] 05/28/2021 9:00 A M Medical Record Number: [id number] Patient Account Number: [id number] Date of Birth/Sex: Treating RN: [birth year] (74 y.o. 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 - with Leonard Schwartz Return A Bathing/ Shower/ Hygiene May shower and wash wound with soap and water. - with dressing changes Off-Loading Wedge shoe to: - front offloading shoe to left foot Wound Treatment Wound #1 - Amputation Site - Toe Wound Laterality: Left Prim Dressing: KerraCel Ag Gelling Fiber Dressing, 2x2 in (silver alginate) (DME) (Dispense As Written) 1 x Per Day/15 Days ary Discharge Instructions: Apply silver alginate to wound bed pack lightly into empty space Secondary Dressing: Woven Gauze Sponges 2x2 in (Generic) 1 x Per Day/15 Days Discharge Instructions: Apply over primary dressing as directed. Secured With: Insurance underwriter, Sterile 2x75 (in/in) (Generic) 1 x Per Day/15 Days Discharge Instructions: Secure with stretch gauze as directed. Wound #2 - T Second oe Wound Laterality: Dorsal, Left Prim Dressing: Santyl Ointment 1 x Per Day/30 Days ary Discharge Instructions: Apply nickel thick amount to wound bed as instructed Secondary Dressing: Woven Gauze Sponges 2x2 in (Generic) 1 x Per Day/30 Days Discharge Instructions: Apply over primary dressing as directed. Secured With: Insurance underwriter, Sterile 2x75 (in/in) 1 x Per Day/30 Days Discharge Instructions: Secure with stretch gauze as directed. Wound #3 - T Fifth oe Wound Laterality: Left Prim Dressing: Santyl Ointment 1 x Per Day/30 Days ary Discharge Instructions: Apply nickel thick amount to wound bed as instructed Secondary Dressing:  Woven Gauze Sponges 2x2 in (Generic) 1 x Per Day/30 Days Discharge Instructions: Apply over primary dressing as directed. Secured With: Insurance underwriter, Sterile 2x75 (in/in) 1 x Per Day/30 Days Discharge Instructions: Secure with stretch gauze as directed. Laboratory naerobe culture (MICRO) Bacteria identified in Unspecified specimen by A LOINC Code: 635-3 Convenience Name: Anerobic culture Patient Medications llergies: ACE Inhibitors, lisinopril A Notifications Medication Indication Start End 05/28/2021 Bactrim DS DOSE 1 - oral 800 mg-160 mg tablet - 1 tablet oral taken 2 times per day for 14  days Electronic Signature(s) Signed: 05/28/2021 11:21:18 AM By: Lenda Kelp PA-C Entered By: Lenda Kelp on 05/28/2021 11:21:17 -------------------------------------------------------------------------------- Problem List Details Patient Name: Date of Service: MASYN, ROSTRO 05/28/2021 9:00 A M Medical Record Number: [id number] Patient Account Number: [id number] Date of Birth/Sex: Treating RN: September 07, 1946 (74 y.o. Male) Zenaida Deed Primary Care Provider: [facility] Other Clinician: Referring Provider: Treating Provider/Extender: Tomasita Crumble in Treatment: 0 Active Problems ICD-10 Encounter Code Description Active Date MDM Diagnosis T81.31XA Disruption of external operation (surgical) wound, not elsewhere classified, 05/28/2021 No Yes initial encounter E11.621 Type 2 diabetes mellitus with foot ulcer 05/28/2021 No Yes L97.502 Non-pressure chronic ulcer of other part of unspecified foot with fat layer 05/28/2021 No Yes exposed L97.528 Non-pressure chronic ulcer of other part of left foot with other specified 05/28/2021 No Yes severity I73.89 Other specified peripheral vascular diseases 05/28/2021 No Yes 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) 05/28/2021 No Yes I10 Essential (primary) hypertension 05/28/2021 No Yes I25.10  Atherosclerotic heart disease of native coronary artery without angina pectoris 05/28/2021 No Yes G47.30 Sleep apnea, unspecified 05/28/2021 No Yes Inactive Problems Resolved Problems Electronic Signature(s) Signed: 05/28/2021 11:22:31 AM By: Lenda Kelp PA-C Previous Signature: 05/28/2021 10:55:47 AM Version By: Lenda Kelp PA-C Entered By: Lenda Kelp on 05/28/2021 11:22:31 -------------------------------------------------------------------------------- Progress Note Details Patient Name: Date of Service: [name]. 05/28/2021 9:00 A M Medical Record Number: [id number] Patient Account Number: [id number] Date of Birth/Sex: Treating RN: 1946/04/20 (74 y.o. Male) Zenaida Deed Primary Care Provider: [facility] Other Clinician: Referring Provider: Treating Provider/Extender: Tomasita Crumble in Treatment: 0 Subjective Chief Complaint Information obtained from Patient Left foot ulcers History of Present Illness (HPI) 05/28/2021 upon evaluation today patient appears to be doing somewhat poorly in regard to wounds on his foot. He has really 3 main wounds noted. This is the surgical site which is the amputation of the fourth toe of the left foot. He also has a wound on the dorsal second toe of the left foot and a wound on the dorsal fifth toe of the left foot. 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 With that being said he did seem to have enough blood flow to support a light debridement which I think will benefit him as far as trying to get the areas to heal more effectively. 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 Subsequently due  to necrotic tissue and otherwise he had to be taken back to surgery on May 13 for surgical debridement. Unfortunately he states he is just really not improving the way that he would like to see. He does have a little bit of erythema and though there could be some cellulitis I may put him on an antibiotic for this. It sounds as if there is really not much from a vascular standpoint that can be done at this point for the patient. Nonetheless I think that from a purely wound care standpoint we may be able to get these wounds healed but I think it would definitely take a bit more time than it were not for a  patient with more significant and appropriate blood flow. Patient History Information obtained from Patient. Allergies ACE Inhibitors (Severity: Severe, Reaction: heart attack), lisinopril (Severity: Severe, Reaction: heart attack) Family History Unknown History. Social History Never smoker, Marital Status - Married, Alcohol Use - Never, Drug Use - No History, Caffeine Use - Rarely. Medical History Respiratory Patient has history of Sleep Apnea Cardiovascular Patient has history of Coronary Artery Disease, Hypertension, Myocardial Infarction - 2005, Peripheral Arterial Disease Endocrine Patient has history of Type II Diabetes Musculoskeletal Denies history of Osteomyelitis Neurologic Patient has history of Neuropathy Patient is treated with Insulin, Oral Agents. Blood sugar is tested. Review of Systems (ROS) Constitutional Symptoms (General Health) Denies complaints or symptoms of Fatigue, Fever, Chills, Marked Weight Change. Eyes Complains or has symptoms of Glasses / Contacts - glasses. Denies complaints or symptoms of Dry Eyes, Vision Changes. Ear/Nose/Mouth/Throat Denies complaints or symptoms of Chronic sinus problems or rhinitis. Gastrointestinal Denies complaints or symptoms of Frequent diarrhea, Nausea, Vomiting. Genitourinary Denies complaints or symptoms of Frequent  urination. Integumentary (Skin) Complains or has symptoms of Wounds - wounds on left toes x 3. Musculoskeletal Denies complaints or symptoms of Muscle Pain, Muscle Weakness. Psychiatric Denies complaints or symptoms of Claustrophobia, Suicidal. Objective Constitutional sitting or standing blood pressure is within target range for patient.. pulse regular and within target range for patient.Marland Kitchen respirations regular, non-labored and within target range for patient.Marland Kitchen temperature within target range for patient.. Well-nourished and well-hydrated in no acute distress. Vitals Time Taken: 9:37 AM, Height: 68 in, Source: Stated, Weight: 214 lbs, Source: Stated, BMI: 32.5, Temperature: 98.1 F, Pulse: 85 bpm, Respiratory Rate: 18 breaths/min, Blood Pressure: 104/39 mmHg, Capillary Blood Glucose: 102 mg/dl. Eyes conjunctiva clear no eyelid edema noted. pupils equal round and reactive to light and accommodation. Ears, Nose, Mouth, and Throat no gross abnormality of ear auricles or external auditory canals. normal hearing noted during conversation. mucus membranes moist. Respiratory normal breathing without difficulty. Cardiovascular Absent posterior tibial and dorsalis pedis pulses bilateral lower extremities. no clubbing, cyanosis, significant edema, Musculoskeletal normal gait and posture. no significant deformity or arthritic changes, no loss or range of motion, no clubbing. Psychiatric this patient is able to make decisions and demonstrates good insight into disease process. Alert and Oriented x 3. pleasant and cooperative. General Notes: Upon inspection patient's wound bed actually showed signs of good granulation epithelization at this point in regard to the main surgical wound although there was some necrotic tissue in the base of the wound I am a little skeptical to going to do a lot of surgical debridement here. With that being said I did recommend for the patient based on what I am seeing  that we go ahead and debride the 2 toe ulcerations to try to clear away some of the eschar so that the Santyl can work better on the areas. He was actually in agreement with that plan. Therefore we did perform debridement today he had some discomfort but was able to tolerate which was great. Post debridement the wound beds appear to be doing better I still think Santyl is a good option for him but we will see how things do in this regard. Integumentary (Hair, Skin) Wound #1 status is Open. Original cause of wound was Surgical Injury. The date acquired was: 03/18/2021. The wound is located on the Left Amputation Site - T The wound measures 1.5cm length x 1cm width x 1cm depth; 1.178cm^2 area and 1.178cm^3 volume. There is Fat Layer (Subcutaneous Tissue) exposed. oe. There  is no tunneling or undermining noted. There is a medium amount of serosanguineous drainage noted. The wound margin is flat and intact. There is medium (34-66%) pink granulation within the wound bed. There is a medium (34-66%) amount of necrotic tissue within the wound bed including Adherent Slough. Wound #2 status is Open. Original cause of wound was Gradually Appeared. The date acquired was: 04/20/2021. The wound is located on the Left,Dorsal T oe Second. The wound measures 0.6cm length x 0.7cm width x 0.1cm depth; 0.33cm^2 area and 0.033cm^3 volume. There is no tunneling or undermining noted. There is a medium amount of serosanguineous drainage noted. The wound margin is flat and intact. There is no granulation within the wound bed. There is a large (67-100%) amount of necrotic tissue within the wound bed including Adherent Slough. Wound #3 status is Open. Original cause of wound was Gradually Appeared. The date acquired was: 04/20/2021. The wound is located on the Left T Fifth. The oe wound measures 0.5cm length x 0.5cm width x 0.1cm depth; 0.196cm^2 area and 0.02cm^3 volume. There is no tunneling or undermining noted. There is  a medium amount of serosanguineous drainage noted. The wound margin is flat and intact. There is no granulation within the wound bed. There is a large (67- 100%) amount of necrotic tissue within the wound bed including Eschar. 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 There was a Excisional Skin/Subcutaneous Tissue Debridement with a total area of 0.42 sq cm performed by Lenda Kelp, PA. With the following instrument(s): Curette to remove Viable and Non-Viable tissue/material. Material removed includes Eschar, Subcutaneous Tissue, Slough, and Skin: Epidermis after achieving pain control using Other (benzocaine 20% spray). No specimens were taken. A time out was conducted at 11:05, prior to the start of the procedure. A Minimum amount of bleeding was controlled with Pressure. The procedure was tolerated well with a pain level of 9 throughout and a pain level of 3 following the procedure. Post Debridement Measurements: 0.6cm length x 0.7cm width x 0.1cm depth; 0.033cm^3 volume. Character of Wound/Ulcer Post Debridement is improved. Severity of Tissue Post Debridement is: Fat layer exposed. Post procedure Diagnosis Wound #2: Same as Pre-Procedure Wound #3 Pre-procedure diagnosis of  Wound #3 is a Diabetic Wound/Ulcer of the Lower Extremity located on the Left T Fifth .Severity of Tissue Pre Debridement is: oe Fat layer exposed. There was a Excisional Skin/Subcutaneous Tissue Debridement with a total area of 0.25 sq cm performed by Lenda Kelp, PA. With the following instrument(s): Curette to remove Viable and Non-Viable tissue/material. Material removed includes Eschar, Subcutaneous Tissue, and Skin: Epidermis after achieving pain control using Other (benzocaine 20% spray). No specimens were taken. A time out was conducted at 11:05, prior to the start of the procedure. A Minimum amount of bleeding was controlled with Pressure. The procedure was tolerated well with a pain level of 7 throughout and a pain level of 3 following  the procedure. Post Debridement Measurements: 0.5cm length x 0.5cm width x 0.1cm depth; 0.02cm^3 volume. Character of Wound/Ulcer Post Debridement is improved. Severity of Tissue Post Debridement is: Fat layer exposed. Post procedure Diagnosis Wound #3: Same as Pre-Procedure Plan Follow-up Appointments: Return Appointment in 1 week. - with Luana Shu Shower/ Hygiene: May shower and wash wound with soap and water. - with dressing changes Off-Loading: Wedge shoe to: - front offloading shoe to left foot Laboratory ordered were: Anerobic culture The following medication(s) was prescribed: Bactrim DS oral 800 mg-160 mg tablet 1 1 tablet oral taken 2 times per day for 14 days starting 05/28/2021 WOUND #1: - Amputation Site - T oe Wound Laterality: Left Prim Dressing: KerraCel Ag Gelling Fiber Dressing, 2x2 in (silver alginate) (DME) (Dispense As Written) 1 x Per Day/15 Days ary Discharge Instructions: Apply silver alginate to wound bed pack lightly into empty space Secondary Dressing: Woven Gauze Sponges 2x2 in (Generic) 1 x Per Day/15 Days Discharge Instructions: Apply over primary dressing as directed. Secured With: Administrator, sports, Sterile 2x75 (in/in) (Generic) 1 x Per Day/15 Days Discharge Instructions: Secure with stretch gauze as directed. WOUND #2: - T Second Wound Laterality: Dorsal, Left oe Prim Dressing: Santyl Ointment 1 x Per Day/30 Days ary Discharge Instructions: Apply nickel thick amount to wound bed as instructed Secondary Dressing: Woven Gauze Sponges 2x2 in (Generic) 1 x Per Day/30 Days Discharge Instructions: Apply over primary dressing as directed. Secured With: Insurance underwriter, Sterile 2x75 (in/in) 1 x Per Day/30 Days Discharge Instructions: Secure with stretch gauze as directed. WOUND #3: - T Fifth Wound Laterality: Left oe Prim Dressing: Santyl Ointment 1 x Per Day/30 Days ary Discharge Instructions: Apply nickel thick amount to wound bed as instructed Secondary Dressing: Woven Gauze Sponges 2x2 in (Generic) 1 x Per Day/30 Days Discharge Instructions: Apply over primary dressing as directed. Secured With: Insurance underwriter, Sterile 2x75 (in/in) 1 x Per Day/30 Days Discharge Instructions: Secure with stretch gauze as directed. 1. Would recommend at this time based on what I am seeing that we go ahead and initiate treatment with a continuation of the Santyl. For the surgical wound we will actually use some silver alginate and behind to help pack the area and to hopefully fill in the space to allow this to wick out fluid and allow the wound to heal more effectively and quickly. 2. With regard to the wounds on the toes I think Santyl is appropriate for both of these as well. 3. I am also can recommend the patient continue to wear his offloading shoe. I think that he can walk I would say limit what he does walking wise but again I do not think that he has to be completely nonweightbearing. 4. I am going to place him on Bactrim DS I am also going to send a culture today to see what that will show. Obviously if we need to make any adjustments in therapy we will  do so as necessary. We will see patient back for reevaluation in 1 week here in the clinic. If anything worsens or changes patient will contact our office for additional recommendations. Electronic Signature(s) Signed: 05/28/2021 12:52:34 PM By: Lenda Kelp PA-C Entered By: Lenda Kelp on 05/28/2021 12:52:34 -------------------------------------------------------------------------------- HxROS Details Patient Name: Date of Service: DEVINN, HURWITZ 05/28/2021 9:00 A M Medical Record Number: [id number] Patient Account Number: [id number] Date of Birth/Sex: Treating RN: Aug 17, 1946 (74 y.o. Male) Zandra Abts Primary Care Provider:  [facility] Other Clinician: Referring Provider: Treating Provider/Extender: Tomasita Crumble in Treatment: 0 Information Obtained From Patient Constitutional Symptoms (General Health) Complaints and Symptoms: Negative for: Fatigue; Fever; Chills; Marked Weight Change Eyes Complaints and Symptoms: Positive for: Glasses / Contacts - glasses Negative for: Dry Eyes; Vision Changes Ear/Nose/Mouth/Throat Complaints and Symptoms: Negative for: Chronic sinus problems or rhinitis Gastrointestinal Complaints and Symptoms: Negative for: Frequent diarrhea; Nausea; Vomiting Genitourinary Complaints and Symptoms: Negative for: Frequent urination Integumentary (Skin) Complaints and Symptoms: Positive for: Wounds - wounds on left toes x 3 Musculoskeletal Complaints and Symptoms: Negative for: Muscle Pain; Muscle Weakness Medical History: Negative for: Osteomyelitis Psychiatric Complaints and Symptoms: Negative for: Claustrophobia; Suicidal Hematologic/Lymphatic Respiratory Medical History: Positive for: Sleep Apnea Cardiovascular Medical History: Positive for: Coronary Artery Disease; Hypertension; Myocardial Infarction - 2005; Peripheral Arterial Disease Endocrine Medical History: Positive for: Type II Diabetes Time  with diabetes: since 1984 Treated with: Insulin, Oral agents Blood sugar tested every day: Yes Tested : 4 times a day Immunological Neurologic Medical History: Positive for: Neuropathy Oncologic Immunizations Pneumococcal Vaccine: Received Pneumococcal Vaccination: Yes Implantable Devices None Family and Social History Unknown History: Yes; Never smoker; Marital Status - Married; Alcohol Use: Never; Drug Use: No History; Caffeine Use: Rarely; Financial Concerns: No; Food, Clothing or Shelter Needs: No; Support System Lacking: No; Transportation Concerns: No Electronic Signature(s) Signed: 05/28/2021 5:40:25 PM By: Lenda Kelp PA-C Signed: 05/28/2021 5:57:05 PM By: Zandra Abts RN, BSN Entered By: Zandra Abts on 05/28/2021 10:30:45 -------------------------------------------------------------------------------- SuperBill Details Patient Name: Date of Service: [name] 05/28/2021 Medical Record Number: [id number] Patient Account Number: [id number] Date of Birth/Sex: Treating RN: [birth year] (74 y.o. 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) I10 Essential (primary) hypertension I25.10 Atherosclerotic heart disease of native coronary artery without angina pectoris G47.30 Sleep apnea, unspecified Facility Procedures CPT4 Code: 40981191 Description: 99213 - WOUND CARE VISIT-LEV 3 EST PT Modifier: 25 Quantity: 1 CPT4 Code: [id number] Description: 11042 -  DEB SUBQ TISSUE 20 SQ CM/< ICD-10 Diagnosis Description L97.528 Non-pressure chronic ulcer of other part of left foot with other specified sever Modifier: ity Quantity: 1 Physician Procedures : CPT4 Code Description Modifier [id number] 99204 - WC PHYS LEVEL 4 - NEW PT 25 ICD-10 Diagnosis Description E11.621 Type 2 diabetes mellitus with foot ulcer 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

## 2021-05-28 NOTE — Progress Notes (Signed)
[name], [name] ([id number]) Visit Report for 05/28/2021 Allergy List Details Patient Name: Date of Service: [name], [name] 05/28/2021 9:00 A M Medical Record Number: [id number] Patient Account Number: [id number] Date of Birth/Sex: Treating RN: 1946/07/16 (74 y.o. Male) Zandra Abts Primary Care Esbeidy Mclaine: [facility] Other Clinician: Referring Juddson Cobern: Treating Kaislee Chao/Extender: Tomasita Crumble in Treatment: 0 Allergies Active Allergies ACE Inhibitors Reaction: heart attack Severity: Severe lisinopril Reaction: heart attack Severity: Severe Allergy Notes Electronic Signature(s) Signed: 05/28/2021 5:57:05 PM By: Zandra Abts RN, BSN Entered By: Zandra Abts on 05/28/2021 10:24:31 -------------------------------------------------------------------------------- Arrival Information Details Patient Name: Date of Service: [name] 05/28/2021 9:00 A M Medical Record Number: [id number] Patient Account Number: [id number] Date of Birth/Sex: Treating RN: 12/08/1946 (74 y.o. Male) Zenaida Deed Primary Care Zamoria Boss: [facility] Other Clinician: Referring Mikiah Durall: Treating Saidi Santacroce/Extender: Tomasita Crumble in Treatment: 0 Visit Information Patient Arrived: Ambulatory Arrival Time: 09:37 Accompanied By: self Transfer Assistance: None Patient Identification Verified: Yes Secondary Verification Process Completed: Yes Patient Requires Transmission-Based Precautions: No Patient Has Alerts: Yes Patient Alerts: L ABI: Jeannette TBI: 0.30 R ABI: [place] TBI: 0.32 Electronic Signature(s) Signed: 05/28/2021 5:57:05 PM By: Zandra Abts RN, BSN Entered By: Zandra Abts on 05/28/2021 10:23:27 -------------------------------------------------------------------------------- Clinic Level of Care Assessment Details Patient Name: Date of Service: [name]. 05/28/2021 9:00 A M Medical Record Number: [id number] Patient  Account Number: [id number] Date of Birth/Sex: Treating RN: 1946/07/29 (74 y.o. Male) Zenaida Deed Primary Care Han Lysne: [facility] Other Clinician: Referring Victoriah Wilds: Treating Kaydin Labo/Extender: Tomasita Crumble in Treatment: 0 Clinic Level of Care Assessment Items TOOL 1 Quantity Score  - 0 Use when EandM and Procedure is performed on INITIAL visit ASSESSMENTS - Nursing Assessment / Reassessment X- 1 20 General Physical Exam (combine w/ comprehensive assessment (listed just below) when performed on new pt. evals) X- 1 25 Comprehensive Assessment (HX, ROS, Risk Assessments, Wounds Hx, etc.) ASSESSMENTS - Wound and Skin Assessment / Reassessment  - 0 Dermatologic / Skin Assessment (not related to wound area) ASSESSMENTS - Ostomy and/or Continence Assessment and Care  - 0 Incontinence Assessment and Management  - 0 Ostomy Care Assessment and Management (repouching, etc.) PROCESS - Coordination of Care X - Simple Patient / Family Education for ongoing care 1 15  - 0 Complex (extensive) Patient / Family Education for ongoing care X- 1 10 Staff obtains Chiropractor, Records, T Results / Process Orders est  - 0 Staff telephones HHA, Nursing Homes / Clarify orders / etc  - 0 Routine Transfer to another Facility (non-emergent condition)  - 0 Routine Hospital Admission (non-emergent condition) X- 1 15 New Admissions / Manufacturing engineer / Ordering NPWT Apligraf, etc. ,  - 0 Emergency Hospital Admission (emergent condition) PROCESS - Special Needs  - 0 Pediatric / Minor Patient Management  - 0 Isolation Patient Management  - 0 Hearing / Language / Visual special needs  - 0 Assessment of Community assistance (transportation, D/C planning, etc.)  - 0 Additional assistance / Altered mentation  - 0 Support Surface(s) Assessment (bed, cushion, seat, etc.) INTERVENTIONS - Miscellaneous  - 0 External ear exam  -  0 Patient Transfer (multiple staff / Nurse, adult / Similar devices)  - 0 Simple Staple / Suture removal (25 or less)  - 0 Complex Staple / Suture removal (26 or more)  - 0 Hypo/Hyperglycemic Management (do not check if billed separately)  - 0 Ankle / Brachial Index (  ABI) - do not check if billed separately Has the patient been seen at the hospital within the last three years: Yes Total Score: 85 Level Of Care: New/Established - Level 3 Electronic Signature(s) Signed: 05/28/2021 6:12:56 PM By: Zenaida Deed RN, BSN Signed: 05/28/2021 6:12:56 PM By: Zenaida Deed RN, BSN Entered By: Zenaida Deed on 05/28/2021 11:05:53 -------------------------------------------------------------------------------- Encounter Discharge Information Details Patient Name: Date of Service: [name]. 05/28/2021 9:00 A M Medical Record Number: [id number] Patient Account Number: [id number] Date of Birth/Sex: Treating RN: [birth year] (75 y.o. Male) Fonnie Mu Primary Care Emelee Rodocker: [facility] Other Clinician: Referring Nahum Sherrer: Treating Charmin Aguiniga/Extender: Tomasita Crumble in Treatment: 0 Encounter Discharge Information Items Post Procedure Vitals Discharge Condition: Stable Temperature (F): 98.4 Ambulatory Status: Ambulatory Pulse (bpm): 74 Discharge Destination: Home Respiratory Rate (breaths/min): 17 Transportation: Private Auto Blood Pressure (mmHg): 140/74 Accompanied By: self Schedule Follow-up Appointment: Yes Clinical Summary of Care: Patient Declined Electronic Signature(s) Signed: 05/28/2021 6:04:38 PM By: Fonnie Mu RN Entered By: Fonnie Mu on 05/28/2021 12:45:23 -------------------------------------------------------------------------------- Lower Extremity Assessment Details Patient Name: Date of Service: [name] 05/28/2021 9:00 A M Medical Record Number: [id number] Patient Account Number: [id number] Date of  Birth/Sex: Treating RN: 06/14/1946 (74 y.o. Male) Zandra Abts Primary Care Nubia Ziesmer: [facility] Other Clinician: Referring Laquandra Carrillo: Treating Porche Steinberger/Extender: Tomasita Crumble in Treatment: 0 Edema Assessment Assessed: [Left: No] [Right: No] E[Left: dema] [Right: :] Calf Left: Right: Point of Measurement: 31 cm From Medial Instep 42 cm Ankle Left: Right: Point of Measurement: 10 cm From Medial Instep 24 cm Knee To Floor Left: Right: From Medial Instep 40 cm Vascular Assessment Pulses: Dorsalis Pedis Palpable: [Left:No] Electronic Signature(s) Signed: 05/28/2021 5:57:05 PM By: Zandra Abts RN, BSN Entered By: Zandra Abts on 05/28/2021 10:33:10 -------------------------------------------------------------------------------- Multi-Disciplinary Care Plan Details Patient Name: Date of Service: [name]. 05/28/2021 9:00 A M Medical Record Number: [id number] Patient Account Number: [id number] Date of Birth/Sex: Treating RN: 1946-08-26 (74 y.o. Male) Zenaida Deed Primary Care Haley Roza: [facility] Other Clinician: Referring Dolores Mcgovern: Treating Diannah Rindfleisch/Extender: Tomasita Crumble in Treatment: 0 Multidisciplinary Care Plan reviewed with physician Active Inactive Nutrition Nursing Diagnoses: Impaired glucose control: actual or potential Potential for alteratiion in Nutrition/Potential for imbalanced nutrition Goals: Patient/caregiver will maintain therapeutic glucose control Date Initiated: 05/28/2021 Target Resolution Date: 06/25/2021 Goal Status: Active Interventions: Assess HgA1c results as ordered upon admission and as needed Assess patient nutrition upon admission and as needed per policy Provide education on elevated blood sugars and impact on wound healing Treatment Activities: Patient referred to Primary Care Physician for further nutritional evaluation : 05/28/2021 Notes: Tissue Oxygenation Nursing  Diagnoses: Actual ineffective tissue perfusion; peripheral (select once diagnosis is confirmed) Knowledge deficit related to disease process and management Goals: Patient/caregiver will verbalize understanding of disease process and disease management Date Initiated: 05/28/2021 Target Resolution Date: 06/25/2021 Goal Status: Active Interventions: Assess patient understanding of disease process and management upon diagnosis and as needed Assess peripheral arterial status upon admission and as needed Provide education on tissue oxygenation and ischemia Treatment Activities: T ordered outside of clinic : 05/28/2021 est Notes: Wound/Skin Impairment Nursing Diagnoses: Impaired tissue integrity Knowledge deficit related to ulceration/compromised skin integrity Goals: Patient/caregiver will verbalize understanding of skin care regimen Date Initiated: 05/28/2021 Target Resolution Date: 06/25/2021 Goal Status: Active Ulcer/skin breakdown will have a volume reduction of 30% by week 4 Date Initiated: 05/28/2021 Target Resolution Date: 06/25/2021 Goal Status: Active Interventions: Assess  patient/caregiver ability to obtain necessary supplies Assess patient/caregiver ability to perform ulcer/skin care regimen upon admission and as needed Assess ulceration(s) every visit Provide education on ulcer and skin care Treatment Activities: Skin care regimen initiated : 05/28/2021 Topical wound management initiated : 05/28/2021 Notes: Electronic Signature(s) Signed: 05/28/2021 6:12:56 PM By: Zenaida Deed RN, BSN Entered By: Zenaida Deed on 05/28/2021 11:03:39 -------------------------------------------------------------------------------- Pain Assessment Details Patient Name: Date of Service: [name] 05/28/2021 9:00 A M Medical Record Number: [id number] Patient Account Number: [id number] Date of Birth/Sex: Treating RN: [birth year] (74 y.o. Male) Zandra Abts Primary Care Jeovanny Cuadros: [facility] Other Clinician: Referring Abbigail Anstey: Treating Joel Mericle/Extender: Tomasita Crumble in Treatment: 0 Active Problems Location of Pain Severity and Description of Pain Patient Has Paino Yes Site Locations Pain Location: Pain in Ulcers With Dressing Change: Yes Duration of the Pain. Constant / Intermittento Constant Rate the pain. Current Pain Level: 5 Worst Pain Level: 10 Least Pain Level: 8 Character of Pain Describe the Pain: Throbbing Pain Management and Medication Current Pain Management: Medication: Yes Cold Application: No Rest: No Massage: No Activity: No T.E.N.S.: No Heat Application: No Leg drop or elevation: No Is the Current Pain Management Adequate: Adequate How does your wound impact your activities of daily livingo Sleep: No Bathing: No Appetite: No Relationship With Others: No Bladder Continence: No Emotions: No Bowel Continence: No Work: No Toileting: No Drive: No Dressing: No Hobbies: No Electronic Signature(s) Signed: 05/28/2021 5:57:05 PM By: Zandra Abts RN, BSN Entered By: Zandra Abts on 05/28/2021 10:38:01 -------------------------------------------------------------------------------- Patient/Caregiver Education Details Patient Name: Date of Service: [name], [name] M B. 6/8/2022andnbsp9:00 A M Medical Record Number: [id number] Patient Account Number: [id number] Date of Birth/Gender: Treating RN: 1946-04-29 (75 y.o. Male) Zenaida Deed Primary Care Physician: [facility] Other Clinician: Referring Physician: Treating Physician/Extender: Tomasita Crumble in Treatment: 0 Education Assessment Education Provided To: Patient Education Topics Provided Elevated Blood Sugar/ Impact on Healing: Handouts: Elevated Blood Sugars: How Do They Affect Wound Healing Methods: Explain/Verbal, Printed Responses: Reinforcements needed, State content correctly Welcome [facility]: o Handouts: Welcome [facility] o Methods: Explain/Verbal Responses: Reinforcements needed, State content correctly Wound/Skin Impairment: Handouts: Caring for Your Ulcer, Skin Care Do's and Dont's Methods: Explain/Verbal, Printed Responses: Reinforcements needed, State content correctly Electronic Signature(s) Signed: 05/28/2021 6:12:56 PM By: Zenaida Deed RN, BSN Entered By: Zenaida Deed on 05/28/2021 11:04:48 -------------------------------------------------------------------------------- Wound Assessment Details Patient Name: Date of Service: [name]. 05/28/2021 9:00 A M Medical Record Number: [id number] Patient Account Number: [id number] Date of Birth/Sex: Treating RN: 05/30/1946 (74 y.o. 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 Wounding Event: Surgical Injury Etiology: Date Acquired: 03/18/2021 Wound Open Weeks Of Treatment: 0 Status: Clustered Wound: No Comorbid Sleep Apnea, Coronary Artery Disease, Hypertension, Myocardial History: Infarction, Peripheral Arterial Disease, Type II Diabetes, Neuropathy Photos Wound Measurements Length: (cm) 1.5 Width: (cm) 1 Depth: (cm) 1 Area: (cm) 1.178 Volume: (cm) 1.178 % Reduction in Area: 0% % Reduction in Volume: 0% Epithelialization: None Tunneling: No Undermining: No Wound Description Classification: Full Thickness Without Exposed Support Structures Wound Margin: Flat and Intact Exudate Amount: Medium Exudate Type: Serosanguineous Exudate Color: red, brown Foul Odor After Cleansing: No Slough/Fibrino  Yes Wound Bed Granulation Amount: Medium (34-66%) Exposed  Structure Granulation Quality: Pink Fascia Exposed: No Necrotic Amount: Medium (34-66%) Fat Layer (Subcutaneous Tissue) Exposed: Yes Necrotic Quality: Adherent Slough Tendon Exposed: No Muscle Exposed: No Joint Exposed: No Bone Exposed: No Treatment Notes Wound #1 (Amputation Site - Toe) Wound Laterality: Left Cleanser Peri-Wound Care Topical Primary Dressing KerraCel Ag Gelling Fiber Dressing, 2x2 in (silver alginate) Discharge Instruction: Apply silver alginate to wound bed pack lightly into empty space Secondary Dressing Woven Gauze Sponges 2x2 in Discharge Instruction: Apply over primary dressing as directed. Secured With Conforming Stretch Gauze Bandage, Sterile 2x75 (in/in) Discharge Instruction: Secure with stretch gauze as directed. Compression Wrap Compression Stockings Add-Ons Electronic Signature(s) Signed: 05/28/2021 5:19:11 PM By: Karl Ito Signed: 05/28/2021 5:57:05 PM By: Zandra Abts RN, BSN Entered By: Karl Ito on 05/28/2021 17:09:52 -------------------------------------------------------------------------------- Wound Assessment Details Patient Name: Date of Service: [name] 05/28/2021 9:00 A M Medical Record Number: [id number] Patient Account Number: [id number] Date of Birth/Sex: Treating RN: [birth year] (74 y.o. Male) Zenaida Deed Primary Care Juelle Dickmann: [facility] Other Clinician: Referring Hydia Copelin: Treating Laterica Matarazzo/Extender: Tomasita Crumble in Treatment: 0 Wound Status Wound Number: 2 Primary Diabetic Wound/Ulcer of the Lower Extremity Etiology: Wound Location: Left, Dorsal T Second oe Wound Open Wounding Event: Gradually Appeared Status: Date Acquired: 04/20/2021 Comorbid Sleep Apnea, Coronary Artery Disease, Hypertension, Myocardial Weeks Of Treatment: 0 History: Infarction, Peripheral Arterial Disease, Type II Diabetes, Clustered Wound: No Neuropathy Photos Wound Measurements Length:  (cm) 0.6 Width: (cm) 0.7 Depth: (cm) 0.1 Area: (cm) 0.33 Volume: (cm) 0.033 % Reduction in Area: 0% % Reduction in Volume: 0% Epithelialization: None Tunneling: No Undermining: No Wound Description Classification: Unable to visualize wound bed Wound Margin: Flat and Intact Exudate Amount: Medium Exudate Type: Serosanguineous Exudate Color: red, brown Foul Odor After Cleansing: No Slough/Fibrino Yes Wound Bed Granulation Amount: None Present (0%) Exposed Structure Necrotic Amount: Large (67-100%) Fascia Exposed: No Fat Layer (Subcutaneous Tissue) Exposed: No Tendon Exposed: No Muscle Exposed: No Joint Exposed: No Bone Exposed: No Treatment Notes Wound #2 (Toe Second) Wound Laterality: Dorsal, Left Cleanser Peri-Wound Care Topical Primary Dressing Santyl Ointment Discharge Instruction: Apply nickel thick amount to wound bed as instructed Secondary Dressing Woven Gauze Sponges 2x2 in Discharge Instruction: Apply over primary dressing as directed. Secured With Conforming Stretch Gauze Bandage, Sterile 2x75 (in/in) Discharge Instruction: Secure with stretch gauze as directed. Compression Wrap Compression Stockings Add-Ons Electronic Signature(s) Signed: 05/28/2021 5:19:11 PM By: Karl Ito Signed: 05/28/2021 6:12:56 PM By: Zenaida Deed RN, BSN Entered By: Karl Ito on 05/28/2021 17:08:39 -------------------------------------------------------------------------------- Wound Assessment Details Patient Name: Date of Service: [name] 05/28/2021 9:00 A M Medical Record Number: [id number] Patient Account Number: [id number] Date of Birth/Sex: Treating RN: 1946/03/08 (75 y.o. Male) Zandra Abts Primary Care Etha Stambaugh: [facility] Other Clinician: Referring Aalaya Yadao: Treating Marlowe Lawes/Extender: Tomasita Crumble in Treatment: 0 Wound Status Wound Number: 3 Primary Diabetic Wound/Ulcer of the Lower  Extremity Etiology: Wound Location: Left T Fifth oe Wound Open Wounding Event: Gradually Appeared Status: Date Acquired: 04/20/2021 Comorbid Sleep Apnea, Coronary Artery Disease, Hypertension, Myocardial Weeks Of Treatment: 0 History: Infarction, Peripheral Arterial Disease, Type II Diabetes, Clustered Wound: No Neuropathy Photos Wound Measurements Length: (cm) 0.5 Width: (cm) 0.5 Depth: (cm) 0.1 Area: (cm) 0.196 Volume: (cm) 0.02 % Reduction in Area: 0% % Reduction in Volume: 0% Epithelialization: None Tunneling: No Undermining: No Wound Description Classification: Unable to visualize wound bed Wound Margin: Flat  and Intact Exudate Amount: Medium Exudate Type: Serosanguineous Exudate Color: red, brown Foul Odor After Cleansing: No Slough/Fibrino Yes Wound Bed Granulation Amount: None Present (0%) Exposed Structure Necrotic Amount: Large (67-100%) Fascia Exposed: No Necrotic Quality: Eschar Fat Layer (Subcutaneous Tissue) Exposed: No Tendon Exposed: No Muscle Exposed: No Joint Exposed: No Bone Exposed: No Treatment Notes Wound #3 (Toe Fifth) Wound Laterality: Left Cleanser Peri-Wound Care Topical Primary Dressing Santyl Ointment Discharge Instruction: Apply nickel thick amount to wound bed as instructed Secondary Dressing Woven Gauze Sponges 2x2 in Discharge Instruction: Apply over primary dressing as directed. Secured With Conforming Stretch Gauze Bandage, Sterile 2x75 (in/in) Discharge Instruction: Secure with stretch gauze as directed. Compression Wrap Compression Stockings Add-Ons Electronic Signature(s) Signed: 05/28/2021 5:19:11 PM By: Karl Itoawkins, Destiny Signed: 05/28/2021 5:57:05 PM By: Zandra AbtsLynch, Shatara RN, BSN Entered By: Karl Itoawkins, Destiny on 05/28/2021 17:09:13 -------------------------------------------------------------------------------- Vitals Details Patient Name: Date of Service: [name] PorchUSSELL, [name] M B. 05/28/2021 9:00 A M Medical Record Number:  [id number] Patient Account Number: [id number] Date of Birth/Sex: Treating RN: 28-Jun-1946 (74 y.o. Male) [name], Linda Primary Care Aron Needles: [facility] Other Clinician: Referring Leiah Giannotti: Treating Runell Kovich/Extender: Tomasita CrumbleStone III, Hoyt Clark, Christopher Weeks in Treatment: 0 Vital Signs Time Taken: 09:37 Temperature (F): 98.1 Height (in): 68 Pulse (bpm): 85 Source: Stated Respiratory Rate (breaths/min): 18 Weight (lbs): 214 Blood Pressure (mmHg): 104/39 Source: Stated Capillary Blood Glucose (mg/dl): 956102 Body Mass Index (BMI): 32.5 Reference Range: 80 - 120 mg / dl Electronic Signature(s) Signed: 05/28/2021 5:19:11 PM By: Karl Itoawkins, Destiny Entered By: Karl Itoawkins, Destiny on 05/28/2021 09:38:01

## 2021-06-01 LAB — AEROBIC/ANAEROBIC CULTURE W GRAM STAIN (SURGICAL/DEEP WOUND): Gram Stain: NONE SEEN

## 2021-06-02 ENCOUNTER — Telehealth: Payer: Self-pay

## 2021-06-02 NOTE — Telephone Encounter (Signed)
Patient and [facility] nurse called to report that patient is still in severe pain s/p L toe amputation. He was seen in the office on 05/27/2021 by PA and was also in pain. Nurse notes some yellow slough at amp site. She will continue to do wet to dry dressing changes. It appears he does not have other revascularization options. He would like pain medication because it hurts all the time 24/7 and he can't sleep. Advised him we would need to bring in for o/v. Bringing in tomorrow for wound check.

## 2021-06-03 ENCOUNTER — Other Ambulatory Visit: Payer: Self-pay

## 2021-06-03 ENCOUNTER — Ambulatory Visit (INDEPENDENT_AMBULATORY_CARE_PROVIDER_SITE_OTHER): Payer: No Typology Code available for payment source | Admitting: Vascular Surgery

## 2021-06-03 DIAGNOSIS — I739 Peripheral vascular disease, unspecified: Secondary | ICD-10-CM

## 2021-06-03 MED ORDER — HYDROCODONE-ACETAMINOPHEN 5-325 MG PO TABS: 1.0000 | ORAL_TABLET | Freq: Four times a day (QID) | ORAL | 0 refills | Status: DC | PRN

## 2021-06-03 NOTE — Progress Notes (Signed)
Patient name: [name] MRN: [id number] DOB: 03-12-1946 Sex: male  REASON FOR VISIT: Triage visit for pain in the left foot  HPI: [name] is a 75 y.o. male the presents for triage visit for pain in his left foot.  He is status post left lower extremity angiogram with Dr. Randie Heinz on 03/10/2021 and had inline flow with no intervention.  He then underwent a left fourth toe amputation on 03/18/2021 with Dr. Randie Heinz.  I then debrided this on 05/02/2021.  He is going to the wound clinic and is putting Santyl in the wound and just started wound clinic about 1 week ago. Pain in mainly in the toe amputation site.  Unfortunately does have some new left second and fifth toe ulcerations.  Past Medical History:  Diagnosis Date   CAD, AUTOLOGOUS BYPASS GRAFT    Depression    DIABETES, TYPE 2    DYSPNEA    on exertion   HYPERLIPIDEMIA    OBESITY    OBSTRUCTIVE SLEEP APNEA    cpap   Old myocardial infarction 2005   Peripheral vascular disease (HCC)    PTSD (post-traumatic stress disorder)    Stroke (HCC) 09/2007   light stroke    Past Surgical History:  Procedure Laterality Date   ABDOMINAL AORTOGRAM W/LOWER EXTREMITY Bilateral 03/10/2021   Procedure: ABDOMINAL AORTOGRAM W/LOWER EXTREMITY;  Surgeon: Maeola Harman, MD;  Location: [facility] INVASIVE CV LAB;  Service: Cardiovascular;  Laterality: Bilateral;   AMPUTATION Left 03/18/2021   Procedure: LEFT FOURTH TOE AMPUTATION;  Surgeon: Maeola Harman, MD;  Location: [facility] OR;  Service: Vascular;  Laterality: Left;   APPENDECTOMY  1965   CORONARY ARTERY BYPASS GRAFT  2008   at [facility]   cyst remove on lelt leg,posterior     LOWER EXTREMITY ANGIOGRAPHY N/A 03/07/2018   Procedure: LOWER EXTREMITY ANGIOGRAPHY;  Surgeon: Maeola Harman, MD;  Location: [facility] INVASIVE CV LAB;  Service: Cardiovascular;  Laterality: N/A;   LOWER EXTREMITY ANGIOGRAPHY Bilateral 05/29/2019   Procedure: LOWER EXTREMITY ANGIOGRAPHY;  Surgeon:  Maeola Harman, MD;  Location: [facility] INVASIVE CV LAB;  Service: Cardiovascular;  Laterality: Bilateral;   PERIPHERAL VASCULAR ATHERECTOMY Right 03/07/2018   Procedure: PERIPHERAL VASCULAR ATHERECTOMY;  Surgeon: Maeola Harman, MD;  Location: [facility] INVASIVE CV LAB;  Service: Cardiovascular;  Laterality: Right;  anterioer tibial   VASECTOMY     WOUND DEBRIDEMENT Left 05/02/2021   Procedure: Left 4th toe amputation site debridement including metatarsal head;  Surgeon: Cephus Shelling, MD;  Location: [facility] OR;  Service: Vascular;  Laterality: Left;    Family History  Problem Relation Age of Onset   Diabetes Mother    Alcoholism Father     SOCIAL HISTORY: Social History   Tobacco Use   Smoking status: Former    Pack years: 0.00    Types: Pipe   Smokeless tobacco: Never  Substance Use Topics   Alcohol use: Never    Allergies  Allergen Reactions   Ace Inhibitors     Heart attack    Lisinopril     Heart attack    Current Outpatient Medications  Medication Sig Dispense Refill   acetaminophen (TYLENOL) 500 MG tablet Take 1,500 mg by mouth 2 (two) times daily as needed (pain).     amoxicillin-clavulanate (AUGMENTIN) 875-125 MG tablet Take 1 tablet by mouth 2 (two) times daily. 14 tablet 0   aspirin EC 325 MG tablet Take 325 mg by mouth in the morning.  atorvastatin (LIPITOR) 40 MG tablet Take 40 mg by mouth in the morning.     Charcoal Activated (ACTIVATED CHARCOAL PO) Take 520 mg by mouth in the morning and at bedtime.     Cholecalciferol 2000 units TABS Take 2,000 Units by mouth in the morning.     citalopram (CELEXA) 40 MG tablet Take 20 mg by mouth in the morning.     clopidogrel (PLAVIX) 75 MG tablet Take 75 mg by mouth in the morning.     Coenzyme Q10 (COQ-10) 200 MG CAPS Take 200 mg by mouth in the morning.     fluorouracil (EFUDEX) 5 % cream Apply 1 application topically daily.     furosemide (LASIX) 40 MG tablet Take 40 mg by mouth in the morning.      Green Tea, Camellia sinensis, (GREEN TEA EXTRACT PO) Take 800 mg by mouth 2 (two) times a day. 400 mg each     insulin aspart protamine- aspart (NOVOLOG MIX 70/30) (70-30) 100 UNIT/ML injection Inject 30 Units into the skin in the morning and at bedtime.     Melatonin-Theanine (MELATONIN FORTE/L-THEANINE PO) Take 2 tablets by mouth at bedtime. OLLY Sleep gummies     metFORMIN (GLUCOPHAGE) 1000 MG tablet Take 1,000 mg by mouth in the morning and at bedtime.     metoprolol tartrate (LOPRESSOR) 50 MG tablet Take 50 mg by mouth 2 (two) times daily.     Misc Natural Products (NEURIVA) CAPS Take 1 capsule by mouth at bedtime.     Multiple Vitamin (MULTIVITAMIN WITH MINERALS) TABS tablet Take 1 tablet by mouth in the morning, at noon, and at bedtime. Men's 50+     nitroGLYCERIN (NITROSTAT) 0.4 MG SL tablet Place 0.4 mg under the tongue every 5 (five) minutes x 3 doses as needed for chest pain.     OVER THE COUNTER MEDICATION Take 1 tablet by mouth 3 (three) times daily with meals. Sugar Blockers     OVER THE COUNTER MEDICATION 5 sprays by Mouth Rinse route daily. Mouth coat     oxyCODONE-acetaminophen (PERCOCET) 10-325 MG tablet Take 1 tablet by mouth every 8 (eight) hours as needed for pain. 15 tablet 0   Resveratrol 250 MG CAPS Take 250 mg by mouth daily with lunch.     Semaglutide, 1 MG/DOSE, (OZEMPIC, 1 MG/DOSE,) 4 MG/3ML SOPN Inject 1 mg into the skin every Monday.     sulfamethoxazole-trimethoprim (BACTRIM) 400-80 MG tablet Take 1 tablet by mouth 2 (two) times daily. 20 tablet 0   traZODone (DESYREL) 50 MG tablet Take 25 mg by mouth at bedtime.      No current facility-administered medications for this visit.    REVIEW OF SYSTEMS:  [X]  denotes positive finding, [ ]  denotes negative finding Cardiac  Comments:  Chest pain or chest pressure:    Shortness of breath upon exertion:    Short of breath when lying flat:    Irregular heart rhythm:        Vascular    Pain in calf, thigh, or hip  brought on by ambulation:    Pain in feet at night that wakes you up from your sleep:     Blood clot in your veins:    Leg swelling:         Pulmonary    Oxygen at home:    Productive cough:     Wheezing:         Neurologic    Sudden weakness in arms or legs:  Sudden numbness in arms or legs:     Sudden onset of difficulty speaking or slurred speech:    Temporary loss of vision in one eye:     Problems with dizziness:         Gastrointestinal    Blood in stool:     Vomited blood:         Genitourinary    Burning when urinating:     Blood in urine:        Psychiatric    Major depression:         Hematologic    Bleeding problems:    Problems with blood clotting too easily:        Skin    Rashes or ulcers:        Constitutional    Fever or chills:      PHYSICAL EXAM: Vitals:   06/03/21 0838  BP: (!) 94/38  Pulse: 83  Temp: 98 F (36.7 C)  TempSrc: Skin  SpO2: 96%  Weight: 222 lb 3.2 oz (100.8 kg)  Height: 5\' 8"  (1.727 m)    GENERAL: The patient is a well-nourished male, in no acute distress. The vital signs are documented above. CARDIAC: There is a regular rate and rhythm.  VASCULAR:  Discolored left great toenail with superficial ulcerations on the left second and fifth toes now Left fourth toe amputation site looks fairly clean      DATA:     Assessment/Plan:   75 year old male previously underwent a left fourth toe amputation after arteriogram showed In-line flow down the left lower extremity by Dr. 65.  This has previously undergone debridement as well.  He is having fairly significant pain at home and I did write him for 30 Norco tablets today given the Tylenol is not providing relief.  The fourth toe amputation site looks fairly clean and he is putting Santyl in the wound and is going to the wound clinic.  I did discuss my concern for the new wounds on the left second and fifth toes as well as the discolored left great toenail.  There be  some concern if this progresses he may ultimately require more proximal amputation but I do not think he needs that at this time.  I will have him come back in a couple weeks in the [facility] clinic for wound check.  Randie Heinz, MD [facility] Office: [phone]

## 2021-06-04 ENCOUNTER — Encounter (HOSPITAL_BASED_OUTPATIENT_CLINIC_OR_DEPARTMENT_OTHER): Payer: Medicare Other | Admitting: Physician Assistant

## 2021-06-12 ENCOUNTER — Encounter (HOSPITAL_BASED_OUTPATIENT_CLINIC_OR_DEPARTMENT_OTHER): Payer: Medicare Other | Admitting: Internal Medicine

## 2021-06-12 ENCOUNTER — Other Ambulatory Visit: Payer: Self-pay

## 2021-06-12 DIAGNOSIS — L97528 Non-pressure chronic ulcer of other part of left foot with other specified severity: Secondary | ICD-10-CM | POA: Diagnosis not present

## 2021-06-12 DIAGNOSIS — E11621 Type 2 diabetes mellitus with foot ulcer: Secondary | ICD-10-CM | POA: Diagnosis not present

## 2021-06-12 DIAGNOSIS — T8131XA Disruption of external operation (surgical) wound, not elsewhere classified, initial encounter: Secondary | ICD-10-CM

## 2021-06-12 DIAGNOSIS — L97205 Non-pressure chronic ulcer of unspecified calf with muscle involvement without evidence of necrosis: Secondary | ICD-10-CM | POA: Diagnosis not present

## 2021-06-12 NOTE — Progress Notes (Addendum)
[name], [name] ([id number]) Visit Report for 06/12/2021 Chief Complaint Document Details Patient Name: Date of Service: [name], [name] 06/12/2021 10:45 A M Medical Record Number: [id number] Patient Account Number: [id number] Date of Birth/Sex: Treating RN: March 07, 1946 (75 y.o. [name] Primary Care Provider: [facility] Other Clinician: Referring Provider: Treating Provider/Extender: [name], [name]: 2 Information Obtained from: Patient Chief Complaint Left foot ulcers Electronic Signature(s) Signed: 06/12/2021 2:49:56 PM By: [name] Shan DO Entered By: [name] on 06/12/2021 13:43:13 -------------------------------------------------------------------------------- HPI Details Patient Name: Date of Service: [name] 06/12/2021 10:45 A M Medical Record Number: [id number] Patient Account Number: [id number] Date of Birth/Sex: Treating RN: Aug 26, 1946 (75 y.o. [name] Primary Care Provider: [facility] Other Clinician: Referring Provider: Treating Provider/Extender: [name] [name]: 2 History of Present Illness HPI Description: 05/28/2021 upon evaluation today patient appears to be doing somewhat poorly in regard to wounds on his foot. He has really 3 main wounds noted. This is the surgical site which is the amputation of the fourth toe of the left foot. He also has a wound on the dorsal second toe of the left foot and a wound on the dorsal fifth toe of the left foot. 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 With that being said he did seem to have enough blood flow to support a light debridement which  I think will benefit him as far as trying to get the areas to heal more effectively. 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 Subsequently due to necrotic tissue and otherwise he had to be taken back to surgery on May 13 for surgical debridement. Unfortunately he states he is just really not improving the way that he would like to see. He does have a little bit of erythema and though there could be some cellulitis I may put him on an antibiotic for this. It sounds as if there is really not much from a vascular standpoint that can be done at this point for the patient. Nonetheless I think that from a purely wound care standpoint we may be able to get these wounds healed but I think it would definitely take a bit more time than it were not for a patient with more significant and appropriate blood flow. 6/23; patient was admitted 2 weeks ago by [name] in our clinic. He was started on Santyl and silver alginate and reports doing the dressing changes. He reports finishing Bactrim. He also completed his course of Bactrim. He is reporting continued pain to his foot and states that there is more redness to his toes. He does not recall having imaging done recently of his foot. Electronic Signature(s) Signed: 06/12/2021 2:49:56 PM By: [name] Shan DO Entered By: [name] on 06/12/2021 13:45:32 -------------------------------------------------------------------------------- Physical Exam Details Patient Name: Date of Service: [name], [name] 06/12/2021 10:45 A M Medical Record Number: [id number] Patient Account Number: [id number] Date of Birth/Sex: Treating RN: [birth year] (75 y.o. [name] Primary Care Provider: [facility] Other Clinician: Referring Provider: Treating Provider/Extender: [name], [name]: 2  Constitutional respirations regular, non-labored and within target range for  patient.Marland Kitchen Psychiatric pleasant and cooperative. Notes Left foot: The amputation site is deeper than previous clinic visit. There is some nonviable tissue with scant granulation tissue. The left second and fifth toe has a yellow scab. There is significant pain on palpation to these areas mostly to the amputation site. There is also increased warmth and erythema to the digits. Difficult to palpate pedal pulses Electronic Signature(s) Signed: 06/12/2021 2:49:56 PM By: [name] Shan DO Entered By: [name] on 06/12/2021 14:12:33 -------------------------------------------------------------------------------- Physician Orders Details Patient Name: Date of Service: [name] 06/12/2021 10:45 A M Medical Record Number: [id number] Patient Account Number: [id number] Date of Birth/Sex: Treating RN: 15-May-1946 (75 y.o. 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 - with [facility] on Wednesday!! Return A Other: - Please pick up your new prescription of antibiotics and begin taking ASAP Bathing/ Shower/ Hygiene May shower and wash wound with soap and  water. - with dressing changes Edema Control - Lymphedema / SCD / Other Elevate legs to the level of the heart or above for 30 minutes daily and/or when sitting, a frequency of: Avoid standing for long periods of time. Off-Loading Wedge shoe to: - front offloading shoe to left foot Home Health New wound care orders this week; continue Home Health for wound care. May utilize formulary equivalent dressing for wound treatment orders unless otherwise specified. Other Home Health Orders/Instructions: - [facility] Wound Treatment Wound #1 - Amputation Site - Toe Wound Laterality: Left Prim Dressing: KerraCel Ag Gelling Fiber Dressing, 2x2 in (silver alginate) (Dispense As Written) 1 x Per Day/15 Days ary Discharge Instructions: Apply silver alginate to wound bed pack lightly into empty space Secondary Dressing: Woven Gauze Sponges 2x2 in (Generic) 1 x Per Day/15 Days Discharge Instructions: Apply over primary dressing as directed. Secured With: Child psychotherapist, Sterile 2x75 (in/in) (Generic) 1 x Per Day/15 Days Discharge Instructions: Secure with stretch gauze as directed. Wound #2 - T Second oe Wound Laterality: Dorsal, Left Prim Dressing: Santyl Ointment 1 x Per Day/30 Days ary Discharge Instructions: Apply nickel thick amount to wound bed as instructed Secondary Dressing: Woven Gauze Sponges 2x2 in (Generic) 1 x Per Day/30 Days Discharge Instructions: Apply over primary dressing as directed. Secured With: Child psychotherapist, Sterile 2x75 (in/in) 1 x Per Day/30 Days Discharge Instructions: Secure with stretch gauze as directed. Wound #3 - T Fifth oe Wound Laterality: Left Prim Dressing: Santyl Ointment 1 x Per Day/30 Days ary Discharge Instructions: Apply nickel thick amount to wound bed as instructed Secondary Dressing: Woven Gauze Sponges 2x2 in (Generic) 1 x Per Day/30 Days Discharge Instructions: Apply over primary dressing as directed. Secured With:  Child psychotherapist, Sterile 2x75 (in/in) 1 x Per Day/30 Days Discharge Instructions: Secure with stretch gauze as directed. Laboratory  Erythrocyte sedimentation rate (HEM) LOINC Code: [id number] Convenience Name: Sed rate-method unspecified C reactive protein [Mass/volume] in Serum or Plasma (CHEM) LOINC Code: 1988-5 Convenience Name: C Reactive Protein in serum or plasma CBC W A Differential panel in Blood (HEM-CBC) uto LOINC Code: 12248-2 Convenience Name: CBC W Auto Differential panel Radiology X-ray, foot - Left foot r/t worsening of ulcers ICD 10: Patient Medications llergies: ACE Inhibitors, lisinopril A Notifications Medication Indication Start End 06/12/2021 clindamycin HCl DOSE 1 - oral 300 mg capsule - 1 capsule 4 times daily for 7 days Electronic Signature(s) Signed: 06/12/2021 5:20:14 PM By: [name] Shan DO Signed: 06/13/2021 6:18:32 PM By: Rhae Hammock RN Signed: 06/13/2021 6:18:32 PM By: Rhae Hammock RN Previous Signature: 06/12/2021 2:49:56 PM Version By: [name] Shan DO Previous Signature: 06/12/2021 12:26:55 PM Version By: [name] Shan DO Entered By: Rhae Hammock on 06/12/2021 16:54:35 Prescription 06/12/2021 -------------------------------------------------------------------------------- Kelton Pillar B. [name] Shan DO Patient Name: Provider: Jul 29, 1946 [id number] Date of Birth: NPI#: Jerilynn Mages [id number] Sex: DEA #: [phone] Phone #: License #: [place] Patient Address: [address] [place], [place] [address] [place], [place] [address] [phone] Allergies ACE Inhibitors; lisinopril Provider's Orders X-ray, foot - Left foot r/t worsening of ulcers ICD 10: Hand Signature: Date(s): Prescription 06/12/2021 Kelton Pillar B. [name] Shan DO Patient Name: Provider: [birth year] [id number] Date of Birth: NPI#: Jerilynn Mages [id number] Sex: DEA  #: [phone] Phone #: License #: [place] Patient Address: [address] [place], [place] [address] [place], [place] [address] [phone] Allergies ACE Inhibitors; lisinopril Provider's Orders Erythrocyte sedimentation rate LOINC Code: 28638-1 Convenience Name: Sed rate-method unspecified Hand Signature: Date(s): Prescription 06/12/2021 Kelton Pillar B. [name] Shan DO Patient Name: Provider: 1946-03-14 [id number] Date of Birth: NPI#: Jerilynn Mages [id number] Sex: DEA #: [phone] Phone #: License #: [place] Patient Address: [address] [place], [place] [address] [place], [place] [address] [phone] Allergies ACE Inhibitors; lisinopril Provider's Orders C reactive protein [Mass/volume] in Serum or Plasma LOINC Code: 1988-5 Convenience Name: C Reactive Protein in serum or plasma Hand Signature: Date(s): Prescription 06/12/2021 Kelton Pillar B. [name] Shan DO Patient Name: Provider: 1946-11-17 [id number] Date of Birth: NPI#: Jerilynn Mages [id number] Sex: DEA #: [phone] Phone #: License #: [place] Patient Address: [address] [place], [place] [address] [place],  [address] [phone] Allergies ACE Inhibitors; lisinopril Provider's Orders CBC W A Differential panel in Blood uto LOINC Code: 79728-2 Convenience Name: CBC W Auto Differential panel Hand Signature: Date(s): Electronic Signature(s) Signed: 06/12/2021 5:20:14 PM By: [name] Shan DO Signed: 06/13/2021 6:18:32 PM By: Rhae Hammock RN Previous Signature: 06/12/2021 2:49:56 PM Version By: [name] Shan DO Previous Signature: 06/12/2021 1:28:01 PM Version By: [name] Shan DO Entered By: Rhae Hammock on 06/12/2021  16:54:35 -------------------------------------------------------------------------------- Problem List Details Patient Name: Date of Service: [name], [name] 06/12/2021 10:45 A M Medical Record Number: [id number] Patient Account Number: [id number] Date of Birth/Sex: Treating RN: 1946/09/04 (75 y.o. [name] Primary Care Provider: [facility] Other Clinician: Referring Provider: Treating Provider/Extender: [name] [name]: 2 Active Problems ICD-10 Encounter Code Description Active Date MDM Diagnosis T81.31XA Disruption of external operation (surgical) wound, not elsewhere classified, 05/28/2021 No Yes initial encounter E11.621 Type 2 diabetes mellitus with  foot ulcer 05/28/2021 No Yes L97.502 Non-pressure chronic ulcer of other part of unspecified foot with fat layer 05/28/2021 No Yes exposed L97.528 Non-pressure chronic ulcer of other part of left foot with other specified 05/28/2021 No Yes severity I73.89 Other specified peripheral vascular diseases 05/28/2021 No Yes 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) 05/28/2021 No Yes I10 Essential (primary) hypertension 05/28/2021 No Yes I25.10 Atherosclerotic heart disease of native coronary artery without angina pectoris 05/28/2021 No Yes G47.30 Sleep apnea, unspecified 05/28/2021 No Yes Inactive Problems Resolved Problems Electronic Signature(s) Signed: 06/12/2021 2:49:56 PM By: [name] Shan DO Entered By: [name] on 06/12/2021 13:42:54 -------------------------------------------------------------------------------- Progress Note Details Patient Name: Date of Service: [name] 06/12/2021 10:45 A M Medical Record Number: [id number] Patient Account Number: [id number] Date of Birth/Sex: Treating RN: Oct 01, 1946 (75 y.o. [name] Primary Care Provider: [facility] Other Clinician: Referring Provider: Treating Provider/Extender: [name] [facility] Weeks in  Treatment: 2 Subjective Chief Complaint Information obtained from Patient Left foot ulcers History of Present Illness (HPI) 05/28/2021 upon evaluation today patient appears to be doing somewhat poorly in regard to wounds on his foot. He has really 3 main wounds noted. This is the surgical site which is the amputation of the fourth toe of the left foot. He also has a wound on the dorsal second toe of the left foot and a wound on the dorsal fifth toe of the left foot. 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 With that being said he did seem to have enough blood flow to support a light debridement which I think will benefit him as far as trying to get the areas to heal more effectively. 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 Subsequently due to necrotic tissue and otherwise he had to be taken back to surgery on May 13 for surgical debridement. Unfortunately he states he is just really not improving the way that he would like to see. He does have a little bit of erythema and though there could be some cellulitis I may put him on an antibiotic for this. It sounds as if there is really not much from a vascular standpoint that can be done at this point for the patient. Nonetheless I think that from a purely wound care standpoint we may be able to get these wounds healed but I think it would definitely take a bit more time than it were not for a patient with more significant and appropriate blood flow. 6/23; patient was admitted 2 weeks ago by [name] in our clinic. He was started on Santyl and silver alginate and reports doing the dressing changes. He reports finishing Bactrim. He also  completed his course of Bactrim. He is reporting continued pain to his foot and states that there is more redness to his toes. He does not recall having imaging done recently of his foot. Patient History Information obtained from Patient. Family History Unknown History. Social History Never smoker, Marital Status - Married, Alcohol Use - Never, Drug Use - No History, Caffeine Use - Rarely. Medical History Respiratory Patient has history of Sleep Apnea Cardiovascular Patient has history of Coronary Artery  Disease, Hypertension, Myocardial Infarction - 2005, Peripheral Arterial Disease Endocrine Patient has history of Type II Diabetes Musculoskeletal Denies history of Osteomyelitis Neurologic Patient has history of Neuropathy Objective Constitutional respirations regular, non-labored and within target range for patient.. Vitals Time Taken: 11:33 AM, Height: 68 in, Weight: 214 lbs, BMI: 32.5, Temperature: 97.7 F, Pulse: 91 bpm, Respiratory Rate: 18 breaths/min, Blood Pressure: 108/74 mmHg, Capillary Blood Glucose: 173 mg/dl. Psychiatric pleasant and cooperative. General Notes: Left foot: The amputation site is deeper than previous clinic visit. There is some nonviable tissue with scant granulation tissue. The left second and fifth toe has a yellow scab. There is significant pain on palpation to these areas mostly to the amputation site. There is also increased warmth and erythema to the digits. Difficult to palpate pedal pulses Integumentary (Hair, Skin) Wound #1 status is Open. Original cause of wound was Surgical Injury. The date acquired was: 03/18/2021. The wound has been in treatment 2 weeks. The wound is located on the Left Amputation Site - T The wound measures 1.2cm length x 1.2cm width x 2.5cm depth; 1.131cm^2 area and 2.827cm^3 volume. oe. There is Fat Layer (Subcutaneous Tissue) exposed. There is no tunneling or undermining noted. There is a medium amount of serosanguineous  drainage noted. The wound margin is flat and intact. There is medium (34-66%) pink granulation within the wound bed. There is a medium (34-66%) amount of necrotic tissue within the wound bed including Adherent Slough. Wound #2 status is Open. Original cause of wound was Gradually Appeared. The date acquired was: 04/20/2021. The wound has been in treatment 2 weeks. The wound is located on the Left,Dorsal T Second. The wound measures 0.6cm length x 0.5cm width x 0.2cm depth; 0.236cm^2 area and 0.047cm^3 volume. oe There is no tunneling or undermining noted. There is a medium amount of serosanguineous drainage noted. The wound margin is flat and intact. There is no granulation within the wound bed. There is a large (67-100%) amount of necrotic tissue within the wound bed. Wound #3 status is Open. Original cause of wound was Gradually Appeared. The date acquired was: 04/20/2021. The wound has been in treatment 2 weeks. The wound is located on the Left T Fifth. The wound measures 0.3cm length x 0.2cm width x 0.1cm depth; 0.047cm^2 area and 0.005cm^3 volume. There is no oe tunneling or undermining noted. There is a medium amount of serosanguineous drainage noted. The wound margin is flat and intact. There is no granulation within the wound bed. There is a large (67-100%) amount of necrotic tissue within the wound bed including Eschar. 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 The wounds on the second and fifth toes are stable. He follows with vascular surgery and recently had this amputation wound site debrided on 05/02/2021. At this time I would like to obtain an image of his foot. He states he may not be able to do an MRI due to claustrophobia and he would need to be completely knocked out. I will start with an x-ray and obtain an  ESR, CRP and CBC with differential. I Will put him on clindamycin since he has finished his course of Bactrim and this will cover for anaerobes and gram-positive organisms. He can continue the previous dressing of silver alginate and Santyl. Plan Follow-up Appointments: Return Appointment in 1 week. - with [name] on Wednesday Other: - Please pick up your new prescription of antibiotics and begin taking ASAP Bathing/ Shower/ Hygiene: May shower and wash wound with soap and water. - with dressing changes Edema Control - Lymphedema / SCD / Other: Elevate legs to the level of the heart or above for 30 minutes daily and/or when sitting, a frequency of: Avoid standing for long periods of time. Off-Loading: Wedge shoe to: - front offloading shoe to left foot Home Health: New wound care orders this week; continue Home Health for wound care. May utilize formulary equivalent dressing for wound treatment orders unless otherwise specified. Other Home Health Orders/Instructions: - [id number] Radiology ordered were: X-ray, foot - Left foot r/t worsening of ulcers ICD 10: Laboratory ordered were: Sed rate -method unspecified, C Reactive Protein in serum or plasma, CBC W Auto Differential panel The following medication(s) was prescribed: clindamycin HCl oral 300 mg capsule 1 1 capsule 4 times daily for 7 days starting 06/12/2021 WOUND #1: - Amputation Site - T oe Wound Laterality: Left Prim Dressing: KerraCel Ag Gelling Fiber Dressing, 2x2 in (silver alginate) (Dispense As Written) 1 x  Per Day/15 Days ary Discharge Instructions: Apply silver alginate to wound bed pack lightly into empty space Secondary Dressing: Woven Gauze Sponges 2x2 in (Generic) 1 x Per Day/15 Days Discharge Instructions: Apply over primary dressing as directed. Secured With: Child psychotherapist, Sterile 2x75 (in/in) (Generic) 1 x Per Day/15 Days Discharge Instructions: Secure with stretch gauze as directed. WOUND #2: - T Second Wound Laterality: Dorsal, Left oe Prim Dressing: Santyl Ointment 1 x Per Day/30 Days ary Discharge Instructions: Apply nickel thick amount to wound bed as instructed Secondary Dressing: Woven Gauze Sponges 2x2 in (Generic) 1 x Per Day/30 Days Discharge Instructions: Apply over primary dressing as directed. Secured With: Child psychotherapist, Sterile 2x75 (in/in) 1 x Per Day/30 Days Discharge Instructions: Secure with stretch gauze as directed. WOUND #3: - T Fifth Wound Laterality: Left oe Prim Dressing: Santyl Ointment 1 x Per Day/30 Days ary Discharge Instructions: Apply nickel thick amount to wound bed as instructed Secondary Dressing: Woven Gauze Sponges 2x2 in (Generic) 1 x Per Day/30 Days Discharge Instructions: Apply over primary dressing as directed. Secured With: Child psychotherapist, Sterile 2x75 (in/in) 1 x Per Day/30 Days Discharge Instructions: Secure with stretch gauze as directed. 1. X-ray of left foot 2. ESR, CRP and CBC with differential 3. Continue current dressing silver alginate and Santyl 4. Clindamycin 5. Follow-up in a week Electronic Signature(s) Signed: 06/12/2021 2:49:56 PM By: [name] Shan DO Entered By: [name] on 06/12/2021 14:49:10 -------------------------------------------------------------------------------- HxROS Details Patient Name: Date of Service: [name] 06/12/2021 10:45 A M Medical Record Number: [id number] Patient Account Number: [id number] Date of Birth/Sex: Treating  RN: 02-21-1946 (75 y.o. [name] Primary Care Provider: [facility] Other Clinician: Referring Provider: Treating Provider/Extender: [name], [name]: 2 Information Obtained From Patient Respiratory Medical History: Positive for: Sleep Apnea Cardiovascular Medical History: Positive for: Coronary Artery Disease; Hypertension; Myocardial Infarction - 2005; Peripheral Arterial Disease Endocrine Medical History: Positive for: Type II Diabetes Time with diabetes: since 1984 Treated with: Insulin, Oral agents Blood sugar  tested every day: Yes Tested : 4 times a day Musculoskeletal Medical History: Negative for: Osteomyelitis Neurologic Medical History: Positive for: Neuropathy Immunizations Pneumococcal Vaccine: Received Pneumococcal Vaccination: Yes Implantable Devices None Family and Social History Unknown History: Yes; Never smoker; Marital Status - Married; Alcohol Use: Never; Drug Use: No History; Caffeine Use: Rarely; Financial Concerns: No; Food, Clothing or Shelter Needs: No; Support System Lacking: No; Transportation Concerns: No Electronic Signature(s) Signed: 06/12/2021 2:49:56 PM By: [name] Shan DO Signed: 06/13/2021 6:18:32 PM By: Rhae Hammock RN Entered By: [name] on 06/12/2021 13:45:38 -------------------------------------------------------------------------------- SuperBill Details Patient Name: Date of Service: [name] 06/12/2021 Medical Record Number: [id number] Patient Account Number: [id number] Date of Birth/Sex: Treating RN: 10-07-1946 (75 y.o. 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 L97.528 Non-pressure chronic ulcer of other part of left foot with other specified severity I73.89 Other specified peripheral vascular diseases 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) I10 Essential (primary) hypertension I25.10 Atherosclerotic heart disease of native coronary artery without angina pectoris G47.30 Sleep apnea, unspecified Facility Procedures CPT4 Code: [id number] Description: [phone] - WOUND CARE VISIT-LEV 5 EST PT Modifier: Quantity: 1 Physician Procedures : CPT4 Code Description Modifier [id number] - WC PHYS LEVEL 4 - EST PT ICD-10 Diagnosis Description L97.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

## 2021-06-13 ENCOUNTER — Other Ambulatory Visit: Payer: Self-pay | Admitting: Physician Assistant

## 2021-06-13 ENCOUNTER — Telehealth: Payer: Self-pay

## 2021-06-13 MED ORDER — HYDROCODONE-ACETAMINOPHEN 5-325 MG PO TABS: 1.0000 | ORAL_TABLET | Freq: Four times a day (QID) | ORAL | 0 refills | Status: DC | PRN

## 2021-06-13 NOTE — Telephone Encounter (Signed)
Traci from [facility] calls today to report that patient is out of pain medicine and still having pretty severe pain in his toe amp/debridement site. Says it is reddened and has 1+ pitting edema, otherwise looks okay - no drainage, fever, chills. Discussed with PA, called in a refill of pain medicine. Patient to follow up with wound care visit on 06/24/2021.

## 2021-06-24 ENCOUNTER — Ambulatory Visit: Payer: No Typology Code available for payment source

## 2021-06-24 ENCOUNTER — Telehealth: Payer: Self-pay

## 2021-06-24 NOTE — Telephone Encounter (Signed)
Patient left VM requesting pain medicine. States "I need something to treat this GD* pain, not the crap you've been giving me." Left return vm for a call back.

## 2021-06-25 ENCOUNTER — Encounter (HOSPITAL_BASED_OUTPATIENT_CLINIC_OR_DEPARTMENT_OTHER): Payer: No Typology Code available for payment source | Admitting: Physician Assistant

## 2021-06-30 NOTE — Progress Notes (Deleted)
POST OPERATIVE OFFICE NOTE    CC:  F/u for surgery  HPI:  This is a 75 y.o. male who is s/p left lower extremity angiogram with Dr. Randie Heinz on 03/10/2021 and had inline flow with no intervention.  He then underwent a left fourth toe amputation on 03/18/2021 with Dr. Randie Heinz.  Dr. Chestine Spore then debrided this on 05/02/2021.  He was seen on 06/03/2021 by Dr. Chestine Spore and at that time, he did have some new left 2nd and 5th toe ulcerations.  He was being treated at the wound care clinic with [facility].  Pt returns today for follow up.  Pt states ***  Allergies  Allergen Reactions   Ace Inhibitors     Heart attack    Lisinopril     Heart attack    Current Outpatient Medications  Medication Sig Dispense Refill   acetaminophen (TYLENOL) 500 MG tablet Take 1,500 mg by mouth 2 (two) times daily as needed (pain).     amoxicillin-clavulanate (AUGMENTIN) 875-125 MG tablet Take 1 tablet by mouth 2 (two) times daily. 14 tablet 0   aspirin EC 325 MG tablet Take 325 mg by mouth in the morning.     atorvastatin (LIPITOR) 40 MG tablet Take 40 mg by mouth in the morning.     Charcoal Activated (ACTIVATED CHARCOAL PO) Take 520 mg by mouth in the morning and at bedtime.     Cholecalciferol 2000 units TABS Take 2,000 Units by mouth in the morning.     citalopram (CELEXA) 40 MG tablet Take 20 mg by mouth in the morning.     clopidogrel (PLAVIX) 75 MG tablet Take 75 mg by mouth in the morning.     Coenzyme Q10 (COQ-10) 200 MG CAPS Take 200 mg by mouth in the morning.     fluorouracil (EFUDEX) 5 % cream Apply 1 application topically daily.     furosemide (LASIX) 40 MG tablet Take 40 mg by mouth in the morning.     Green Tea, Camellia sinensis, (GREEN TEA EXTRACT PO) Take 800 mg by mouth 2 (two) times a day. 400 mg each     HYDROcodone-acetaminophen (NORCO) 5-325 MG tablet Take 1 tablet by mouth every 6 (six) hours as needed for moderate pain. 20 tablet 0   insulin aspart protamine- aspart (NOVOLOG MIX 70/30) (70-30) 100  UNIT/ML injection Inject 30 Units into the skin in the morning and at bedtime.     Melatonin-Theanine (MELATONIN FORTE/L-THEANINE PO) Take 2 tablets by mouth at bedtime. OLLY Sleep gummies     metFORMIN (GLUCOPHAGE) 1000 MG tablet Take 1,000 mg by mouth in the morning and at bedtime.     metoprolol tartrate (LOPRESSOR) 50 MG tablet Take 50 mg by mouth 2 (two) times daily.     Misc Natural Products (NEURIVA) CAPS Take 1 capsule by mouth at bedtime.     Multiple Vitamin (MULTIVITAMIN WITH MINERALS) TABS tablet Take 1 tablet by mouth in the morning, at noon, and at bedtime. Men's 50+     nitroGLYCERIN (NITROSTAT) 0.4 MG SL tablet Place 0.4 mg under the tongue every 5 (five) minutes x 3 doses as needed for chest pain.     OVER THE COUNTER MEDICATION Take 1 tablet by mouth 3 (three) times daily with meals. Sugar Blockers     OVER THE COUNTER MEDICATION 5 sprays by Mouth Rinse route daily. Mouth coat     Resveratrol 250 MG CAPS Take 250 mg by mouth daily with lunch.     Semaglutide, 1  MG/DOSE, (OZEMPIC, 1 MG/DOSE,) 4 MG/3ML SOPN Inject 1 mg into the skin every Monday.     sulfamethoxazole-trimethoprim (BACTRIM) 400-80 MG tablet Take 1 tablet by mouth 2 (two) times daily. 20 tablet 0   traZODone (DESYREL) 50 MG tablet Take 25 mg by mouth at bedtime.      No current facility-administered medications for this visit.     ROS:  See HPI  Physical Exam:  ***  Incision:  *** Extremities:  *** Neuro: *** Abdomen:  ***   Assessment/Plan:  This is a 75 y.o. male who is s/p: eft lower extremity angiogram with Dr. Randie Heinz on 03/10/2021 and had inline flow with no intervention.  He then underwent a left fourth toe amputation on 03/18/2021 with Dr. Randie Heinz.  Dr. Chestine Spore then debrided this on 05/02/2021.   -***   Doreatha Massed, [facility] [phone]   Clinic MD:  Darrick Penna

## 2021-07-01 ENCOUNTER — Other Ambulatory Visit: Payer: Self-pay

## 2021-07-01 ENCOUNTER — Ambulatory Visit (INDEPENDENT_AMBULATORY_CARE_PROVIDER_SITE_OTHER): Payer: Medicare Other | Admitting: Vascular Surgery

## 2021-07-01 ENCOUNTER — Encounter (INDEPENDENT_AMBULATORY_CARE_PROVIDER_SITE_OTHER): Payer: Self-pay

## 2021-07-01 DIAGNOSIS — I739 Peripheral vascular disease, unspecified: Secondary | ICD-10-CM | POA: Diagnosis not present

## 2021-07-01 MED ORDER — AMOXICILLIN-POT CLAVULANATE 875-125 MG PO TABS: 1.0000 | ORAL_TABLET | Freq: Two times a day (BID) | ORAL | 0 refills | Status: DC

## 2021-07-01 MED ORDER — HYDROCODONE-ACETAMINOPHEN 5-325 MG PO TABS: 1.0000 | ORAL_TABLET | Freq: Four times a day (QID) | ORAL | 0 refills | Status: DC | PRN

## 2021-07-01 NOTE — Progress Notes (Signed)
Patient name: [name] MRN: [id number] DOB: 01-26-1946 Sex: male  REASON FOR VISIT: Triage visit for left foot pain  HPI: [name] is a 75 y.o. male that presents for triage visit for pain in his left foot.  He is status post left lower extremity angiogram with Dr. Randie Heinz on 03/10/2021 and had tibial disase but inline flow with no intervention.  He then underwent a left fourth toe amputation on 03/18/2021 with Dr. Randie Heinz.  I then debrided this on 05/02/2021.  He has been putting Santyl in the wound.  He is having pain in all of his toes.  He walked into the office to be seen today.  He is upset this this has not healed.  States he needs more pain medicine.  Past Medical History:  Diagnosis Date   CAD, AUTOLOGOUS BYPASS GRAFT    Depression    DIABETES, TYPE 2    DYSPNEA    on exertion   HYPERLIPIDEMIA    OBESITY    OBSTRUCTIVE SLEEP APNEA    cpap   Old myocardial infarction 2005   Peripheral vascular disease (HCC)    PTSD (post-traumatic stress disorder)    Stroke (HCC) 09/2007   light stroke    Past Surgical History:  Procedure Laterality Date   ABDOMINAL AORTOGRAM W/LOWER EXTREMITY Bilateral 03/10/2021   Procedure: ABDOMINAL AORTOGRAM W/LOWER EXTREMITY;  Surgeon: Maeola Harman, MD;  Location: [facility] INVASIVE CV LAB;  Service: Cardiovascular;  Laterality: Bilateral;   AMPUTATION Left 03/18/2021   Procedure: LEFT FOURTH TOE AMPUTATION;  Surgeon: Maeola Harman, MD;  Location: [facility];  Service: Vascular;  Laterality: Left;   APPENDECTOMY  1965   CORONARY ARTERY BYPASS GRAFT  2008   at [place]   cyst remove on lelt leg,posterior     LOWER EXTREMITY ANGIOGRAPHY N/A 03/07/2018   Procedure: LOWER EXTREMITY ANGIOGRAPHY;  Surgeon: Maeola Harman, MD;  Location: [facility] INVASIVE CV LAB;  Service: Cardiovascular;  Laterality: N/A;   LOWER EXTREMITY ANGIOGRAPHY Bilateral 05/29/2019   Procedure: LOWER EXTREMITY ANGIOGRAPHY;  Surgeon: Maeola Harman, MD;  Location: [facility] INVASIVE CV LAB;  Service: Cardiovascular;  Laterality: Bilateral;   PERIPHERAL VASCULAR ATHERECTOMY Right 03/07/2018   Procedure: PERIPHERAL VASCULAR ATHERECTOMY;  Surgeon: Maeola Harman, MD;  Location: [facility] INVASIVE CV LAB;  Service: Cardiovascular;  Laterality: Right;  anterioer tibial   VASECTOMY     WOUND DEBRIDEMENT Left 05/02/2021   Procedure: Left 4th toe amputation site debridement including metatarsal head;  Surgeon: Cephus Shelling, MD;  Location: [facility] OR;  Service: Vascular;  Laterality: Left;    Family History  Problem Relation Age of Onset   Diabetes Mother    Alcoholism Father     SOCIAL HISTORY: Social History   Tobacco Use   Smoking status: Former    Pack years: 0.00    Types: Pipe   Smokeless tobacco: Never  Substance Use Topics   Alcohol use: Never    Allergies  Allergen Reactions   Ace Inhibitors     Heart attack    Lisinopril     Heart attack    Current Outpatient Medications  Medication Sig Dispense Refill   acetaminophen (TYLENOL) 500 MG tablet Take 1,500 mg by mouth 2 (two) times daily as needed (pain).     amoxicillin-clavulanate (AUGMENTIN) 875-125 MG tablet Take 1 tablet by mouth 2 (two) times daily. 14 tablet 0   aspirin EC 325 MG tablet Take 325 mg by mouth in the morning.  atorvastatin (LIPITOR) 40 MG tablet Take 40 mg by mouth in the morning.     Charcoal Activated (ACTIVATED CHARCOAL PO) Take 520 mg by mouth in the morning and at bedtime.     Cholecalciferol 2000 units TABS Take 2,000 Units by mouth in the morning.     citalopram (CELEXA) 40 MG tablet Take 20 mg by mouth in the morning.     clopidogrel (PLAVIX) 75 MG tablet Take 75 mg by mouth in the morning.     Coenzyme Q10 (COQ-10) 200 MG CAPS Take 200 mg by mouth in the morning.     fluorouracil (EFUDEX) 5 % cream Apply 1 application topically daily.     furosemide (LASIX) 40 MG tablet Take 40 mg by mouth in the morning.     Green Tea,  Camellia sinensis, (GREEN TEA EXTRACT PO) Take 800 mg by mouth 2 (two) times a day. 400 mg each     HYDROcodone-acetaminophen (NORCO) 5-325 MG tablet Take 1 tablet by mouth every 6 (six) hours as needed for moderate pain. 20 tablet 0   insulin aspart protamine- aspart (NOVOLOG MIX 70/30) (70-30) 100 UNIT/ML injection Inject 30 Units into the skin in the morning and at bedtime.     Melatonin-Theanine (MELATONIN FORTE/L-THEANINE PO) Take 2 tablets by mouth at bedtime. OLLY Sleep gummies     metFORMIN (GLUCOPHAGE) 1000 MG tablet Take 1,000 mg by mouth in the morning and at bedtime.     metoprolol tartrate (LOPRESSOR) 50 MG tablet Take 50 mg by mouth 2 (two) times daily.     Misc Natural Products (NEURIVA) CAPS Take 1 capsule by mouth at bedtime.     Multiple Vitamin (MULTIVITAMIN WITH MINERALS) TABS tablet Take 1 tablet by mouth in the morning, at noon, and at bedtime. Men's 50+     nitroGLYCERIN (NITROSTAT) 0.4 MG SL tablet Place 0.4 mg under the tongue every 5 (five) minutes x 3 doses as needed for chest pain.     OVER THE COUNTER MEDICATION Take 1 tablet by mouth 3 (three) times daily with meals. Sugar Blockers     OVER THE COUNTER MEDICATION 5 sprays by Mouth Rinse route daily. Mouth coat     Resveratrol 250 MG CAPS Take 250 mg by mouth daily with lunch.     Semaglutide, 1 MG/DOSE, (OZEMPIC, 1 MG/DOSE,) 4 MG/3ML SOPN Inject 1 mg into the skin every Monday.     sulfamethoxazole-trimethoprim (BACTRIM) 400-80 MG tablet Take 1 tablet by mouth 2 (two) times daily. 20 tablet 0   traZODone (DESYREL) 50 MG tablet Take 25 mg by mouth at bedtime.      No current facility-administered medications for this visit.    REVIEW OF SYSTEMS:  [X]  denotes positive finding, [ ]  denotes negative finding Cardiac  Comments:  Chest pain or chest pressure:    Shortness of breath upon exertion:    Short of breath when lying flat:    Irregular heart rhythm:        Vascular    Pain in calf, thigh, or hip brought on  by ambulation:    Pain in feet at night that wakes you up from your sleep:     Blood clot in your veins:    Leg swelling:         Pulmonary    Oxygen at home:    Productive cough:     Wheezing:         Neurologic    Sudden weakness in arms or legs:  Sudden numbness in arms or legs:     Sudden onset of difficulty speaking or slurred speech:    Temporary loss of vision in one eye:     Problems with dizziness:         Gastrointestinal    Blood in stool:     Vomited blood:         Genitourinary    Burning when urinating:     Blood in urine:        Psychiatric    Major depression:         Hematologic    Bleeding problems:    Problems with blood clotting too easily:        Skin    Rashes or ulcers:        Constitutional    Fever or chills:      PHYSICAL EXAM: There were no vitals filed for this visit.   GENERAL: The patient is a well-nourished male, in no acute distress. The vital signs are documented above. CARDIAC: There is a regular rate and rhythm.  VASCULAR:  Left second toe wound Left fourth toe amputation site open Brisk left DP signal/AT signal       DATA:     Assessment/Plan:   75 year old male previously underwent angiogram and left fourth toe amputation by Dr. Randie Heinz.  This has previously undergone debridement as well.  He presents today as a triage add on after walking into the office.  He is having significant pain in all of his toes and the fourth toe amputation is still nonhealing.  He does have a brisk AT and DP signal.  I discussed with him the option of a left TMA.  Ultimately is very upset and states no one ever told him there is a chance that his toe would may not heal after amputation.  He does not want any further surgery at this time.  Ultimately we agreed on refilling his pain medicine with another course of antibiotics.  I will have him see Dr. Randie Heinz in the next 1-2 weeks to discuss any other options.  States he has not seen Dr. Randie Heinz in  follow-up.    Cephus Shelling, MD [facility] [place] Office: [phone]

## 2021-07-01 NOTE — Progress Notes (Signed)
Patient presented to office this morning as a walk-in patient. Had appt scheduled for 7/14 - but thought it was this morning. He had a noticeable limp and was favoring his left leg. He appeared very agitated stating, "I'm in pain all the time and no one will give me anything for my damn pain." I removed the shoe and sock - foot appeared very red and swollen, second toe had a dry ulcer on top, site of the fourth toe amputation had yellowish drainage. Dopplerable pedal pulse. Informed Dr. Chestine Spore and added patient on to schedule for exam.

## 2021-07-02 ENCOUNTER — Encounter (HOSPITAL_BASED_OUTPATIENT_CLINIC_OR_DEPARTMENT_OTHER): Payer: Medicare Other | Attending: Physician Assistant | Admitting: Physician Assistant

## 2021-07-02 DIAGNOSIS — L97528 Non-pressure chronic ulcer of other part of left foot with other specified severity: Secondary | ICD-10-CM | POA: Insufficient documentation

## 2021-07-02 DIAGNOSIS — E1151 Type 2 diabetes mellitus with diabetic peripheral angiopathy without gangrene: Secondary | ICD-10-CM | POA: Diagnosis not present

## 2021-07-02 DIAGNOSIS — E1140 Type 2 diabetes mellitus with diabetic neuropathy, unspecified: Secondary | ICD-10-CM | POA: Diagnosis not present

## 2021-07-02 DIAGNOSIS — E11621 Type 2 diabetes mellitus with foot ulcer: Secondary | ICD-10-CM | POA: Diagnosis present

## 2021-07-02 NOTE — Progress Notes (Addendum)
[name], [name] ([id number]) Visit Report for 07/02/2021 Chief Complaint Document Details Patient Name: Date of Service: [name], [name] 07/02/2021 9:15 A M Medical Record Number: [id number] Patient Account Number: [id number] Date of Birth/Sex: Treating RN: 09/15/1946 (75 y.o. Lytle Michaels Primary Care Provider: [facility], [facility] Other Clinician: Referring Provider: Treating Provider/Extender: Nelda Bucks, [name]: 5 Information Obtained from: Patient Chief Complaint Left foot ulcers Electronic Signature(s) Signed: 07/02/2021 10:11:51 AM By: Lenda Kelp PA-C Entered By: Lenda Kelp on 07/02/2021 10:11:50 -------------------------------------------------------------------------------- Debridement Details Patient Name: Date of Service: [name] 07/02/2021 9:15 A M Medical Record Number: [id number] Patient Account Number: [id number] Date of Birth/Sex: Treating RN: [birth year] (75 y.o. Lytle Michaels Primary Care Provider: [facility] Other Clinician: Referring Provider: Treating Provider/Extender: Nelda Bucks, [name]: 5 Debridement Performed for Assessment: Wound #1 Left Amputation Site - Toe Performed By: Physician Lenda Kelp, PA Debridement Type: Debridement Severity of Tissue Pre Debridement: Fat layer exposed Level of Consciousness (Pre-procedure): Awake and Alert Pre-procedure Verification/Time Out Yes - 10:22 Taken: Start Time: 10:23 Pain Control: Other : Benzocaine T Area Debrided (L x W): otal 1 (cm) x 0.8 (cm) = 0.8 (cm) Tissue and other material debrided: Non-Viable, Muscle, Slough, Fascia , Skin: Dermis , Slough Level: Skin/Subcutaneous Tissue/Muscle Debridement Description: Excisional Instrument: Forceps, Scissors Bleeding: Minimum Hemostasis Achieved: Pressure End Time: 10:28 Response to Treatment: Procedure was tolerated well Level of Consciousness (Post- Awake and  Alert procedure): Post Debridement Measurements of Total Wound Length: (cm) 1 Width: (cm) 0.8 Depth: (cm) 2.3 Volume: (cm) 1.445 Character of Wound/Ulcer Post Debridement: Stable Severity of Tissue Post Debridement: Fat layer exposed Post Procedure Diagnosis Same as Pre-procedure Electronic Signature(s) Signed: 07/02/2021 10:35:31 AM By: Lenda Kelp PA-C Signed: 07/02/2021 6:05:01 PM By: Antonieta Iba Entered By: Lenda Kelp on 07/02/2021 10:35:30 -------------------------------------------------------------------------------- HPI Details Patient Name: Date of Service: [name]. 07/02/2021 9:15 A M Medical Record Number: [id number] Patient Account Number: [id number] Date of Birth/Sex: Treating RN: [birth year] (75 y.o. Lytle Michaels Primary Care Provider: [facility] Other Clinician: Referring Provider: Treating Provider/Extender: Nelda Bucks, [name]: 5 History of Present Illness HPI Description: 05/28/2021 upon evaluation today patient appears to be doing somewhat poorly in regard to wounds on his foot. He has really 3 main wounds noted. This is the surgical site which is the amputation of the fourth toe of the left foot. He also has a wound on the dorsal second toe of the left foot and a wound on the dorsal fifth toe of the left foot. 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 With that being said he did seem to have enough blood flow to support a light debridement which I think will benefit him as far as trying to get the areas to heal more effectively. 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 Subsequently due to  necrotic tissue and otherwise he had to be taken back to surgery on May 13 for surgical debridement. Unfortunately he states he is just really not improving the  way that he would like to see. He does have a little bit of erythema and though there could be some cellulitis I may put him on an antibiotic for this. It sounds as if there is really not much from a vascular standpoint that can be done at this point for the patient. Nonetheless I think that from a purely wound care standpoint we may be able to get these wounds healed but I think it would definitely take a bit more time than it were not for a patient with more significant and appropriate blood flow. 6/23; patient was admitted 2 weeks ago by Leonard Schwartz in our clinic. He was started on Santyl and silver alginate and reports doing the dressing changes. He reports finishing Bactrim. He also completed his course of Bactrim. He is reporting continued pain to his foot and states that there is more redness to his toes. He does not recall having imaging done recently of his foot. 06/22/2021 upon evaluation today patient appears to be doing about the same in regard to his wounds. I am seeing some of the slough from the base of the wound the main wound between his fifth and third toes where he had the amputation. With that being said this is loosening up which is great news. Fortunately there does not appear to be any signs of active infection at this time which is good although he has been placed back on antibiotics by vascular he walked into see them yesterday. They also gave him a refill the pain medication. With that being said looking at the wound currently is starting to clean up I am seeing some granulation in the base my hope is with the fact that it was documented yesterday that he had brisk pulses in the DP and PT that he can heal this wound. Is probably just can take some time. With that being said I do believe based on what I see currently that the  ideal thing may be for [place] to see about initiating a wound VAC. Electronic Signature(s) Signed: 07/02/2021 10:36:20 AM By: Lenda Kelp PA-C Entered By: Lenda Kelp on 07/02/2021 10:36:20 -------------------------------------------------------------------------------- Physical Exam Details Patient Name: Date of Service: JAE, SKEET 07/02/2021 9:15 A M Medical Record Number: [id number] Patient Account Number: [id number] Date of Birth/Sex: Treating RN: 18-May-1946 (75 y.o. Lytle Michaels Primary Care Provider: [facility] Other Clinician: Referring Provider: Treating Provider/Extender: Nelda Bucks, [name]: 5 Constitutional Well-nourished and well-hydrated in no acute distress. Respiratory normal breathing without difficulty. Psychiatric this patient is able to make decisions and demonstrates good insight into disease process. Alert and Oriented x 3. pleasant and cooperative. Notes Upon inspection patient's wound bed actually showed signs of some necrotic tissue in the base of the wound. This is mainly some slough and necrotic fascia/muscle. Subsequently I did clear this away as carefully as I could today there was literally no bleeding but this is due to the fact that I was very careful not to cut too deeply into the wound I do want a make anything worse. The patient tolerated that today without complication and postdebridement it looks better there is some noted granulation tissue in the base of the wound which is great news. Electronic Signature(s) Signed: 07/02/2021 10:36:56 AM By: Lenda Kelp PA-C Entered By: Lenda Kelp on 07/02/2021 10:36:56 -------------------------------------------------------------------------------- Physician Orders Details Patient Name: Date of Service: DEYTON, ELLENBECKER 07/02/2021 9:15 A M Medical Record Number: [id number]  Patient Account Number: [id number] Date of Birth/Sex: Treating RN: [birth year] (75  y.o. 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 - with Kaylee Wombles on Wednesday Return A Bathing/ Shower/ Hygiene May shower and wash wound with soap and water. - with dressing changes Negative Presssure Wound Therapy Wound Vac to wound continuously at 12mm/hg pressure - Bridge to top of foot-change 3x week Black Foam Edema Control - Lymphedema / SCD / Other Elevate legs to the level of the heart or above for 30 minutes daily and/or when sitting, a frequency of: Avoid standing for long periods of time. Off-Loading Wedge shoe to: - front offloading shoe to left foot Additional Orders / Instructions Follow Nutritious Diet Home Health New wound care orders this week; continue Home Health for wound care. May utilize formulary equivalent dressing for wound treatment orders unless otherwise specified. - Ordering wound vac- when obtained go ahead and apply. Other Home Health Orders/Instructions: - [facility] Wound Treatment Wound #1 - Amputation Site - Toe Wound Laterality: Left Prim Dressing: KerraCel Ag Gelling Fiber Dressing, 2x2 in (silver alginate) (Dispense As  Written) 1 x Per Day/15 Days ary Discharge Instructions: Apply silver alginate to wound bed pack lightly into empty space Prim Dressing: Santyl Ointment 1 x Per Day/15 Days ary Discharge Instructions: Apply nickel thick amount to wound bed as instructed Secondary Dressing: Woven Gauze Sponges 2x2 in (Generic) 1 x Per Day/15 Days Discharge Instructions: Apply over primary dressing as directed. Secured With: Insurance underwriter, Sterile 2x75 (in/in) (Generic) 1 x Per Day/15 Days Discharge Instructions: Secure with stretch gauze as directed. Wound #2 - T Second oe Wound Laterality: Dorsal, Left Prim Dressing: Santyl Ointment 1 x Per Day/30 Days ary Discharge Instructions: Apply nickel thick amount to wound bed as instructed Secondary Dressing: Woven Gauze Sponges 2x2 in (Generic) 1 x Per Day/30 Days Discharge Instructions: Apply over primary dressing as directed. Secured With: Insurance underwriter, Sterile 2x75 (in/in) 1 x Per Day/30 Days Discharge Instructions: Secure with stretch gauze as directed. Wound #3 - T Fifth oe Wound Laterality: Left Prim Dressing: Santyl Ointment 1 x Per Day/30 Days ary Discharge Instructions: Apply nickel thick amount to wound bed as instructed Secondary Dressing: Woven Gauze Sponges 2x2 in (Generic) 1 x Per Day/30 Days Discharge Instructions: Apply over primary dressing as directed. Secured With: Insurance underwriter, Sterile 2x75 (in/in) 1 x Per Day/30 Days Discharge Instructions: Secure with stretch gauze as directed. Electronic Signature(s) Signed: 07/02/2021 5:19:59 PM By: Lenda Kelp PA-C Signed: 07/02/2021 6:05:01 PM By: Antonieta Iba Entered By: Antonieta Iba on 07/02/2021 10:33:51 -------------------------------------------------------------------------------- Problem List Details Patient Name: Date of Service: [name] 07/02/2021 9:15 A M Medical Record Number: [id number] Patient Account  Number: [id number] Date of Birth/Sex: Treating RN: 10/18/1946 (75 y.o. Lytle Michaels Primary Care Provider: [facility]  [facility] Other Clinician: Referring Provider: Treating Provider/Extender: Nelda BucksStone III, Loranzo Desha [name]: 5 Active Problems ICD-10 Encounter Code Description Active Date MDM Diagnosis T81.31XA Disruption of external operation (surgical) wound, not elsewhere classified, 05/28/2021 No Yes initial encounter E11.621 Type 2 diabetes mellitus with foot ulcer 05/28/2021 No Yes L97.502 Non-pressure chronic ulcer of other part of unspecified foot with fat layer 05/28/2021 No Yes exposed L97.528 Non-pressure chronic ulcer of other part of left foot with other specified 05/28/2021 No Yes severity I73.89 Other specified peripheral vascular diseases 05/28/2021 No Yes 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) 05/28/2021 No Yes I10 Essential (primary) hypertension 05/28/2021 No Yes I25.10 Atherosclerotic heart disease of native coronary artery without angina pectoris 05/28/2021 No Yes G47.30 Sleep apnea, unspecified 05/28/2021 No Yes Inactive Problems Resolved Problems Electronic Signature(s) Signed: 07/02/2021 5:19:59 PM By: Lenda KelpStone III, Mahlani Berninger PA-C Signed: 07/02/2021 6:05:01 PM By: Antonieta IbaBarnhart, Jodi Previous Signature: 07/02/2021 10:11:44 AM Version By: Lenda KelpStone III, Fayette Gasner PA-C Entered By: Antonieta IbaBarnhart, Jodi on 07/02/2021 10:21:03 -------------------------------------------------------------------------------- Progress Note Details Patient Name: Date of Service: [name] PorchUSSELL, [name] 07/02/2021 9:15 A M Medical Record Number: [id number] Patient Account Number: [id number] Date of Birth/Sex: Treating RN: 31-Dec-1945 (75 y.o. Lytle MichaelsM) Barnhart, Jodi Primary Care Provider: [facility] Other Clinician: Referring Provider: Treating Provider/Extender: Nelda BucksStone III, Amberlea Spagnuolo [name]: 5 Subjective Chief Complaint Information obtained from Patient Left foot ulcers History of Present  Illness (HPI) 05/28/2021 upon evaluation today patient appears to be doing somewhat poorly in regard to wounds on his foot. He has really 3 main wounds noted. This is the surgical site which is the amputation of the fourth toe of the left foot. He also has a wound on the dorsal second toe of the left foot and a wound on the dorsal fifth toe of the left foot. 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 With that being said he did seem to have enough blood flow to support a light debridement which I think will benefit him as far as trying to get the areas to heal more effectively. 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 Subsequently due to necrotic tissue and otherwise he had to be taken back to surgery on May 13 for surgical debridement. Unfortunately he states he is just really not improving the way that he would like to see. He does have a little bit of erythema and though there could be some cellulitis I may put him on an antibiotic for this. It sounds as if there is really not much from a vascular standpoint that can be done at this point for the patient. Nonetheless I think that from a purely wound care standpoint we may be able to get these wounds healed but I think it would definitely take a bit more time than it were not for a patient with more significant and appropriate blood flow. 6/23; patient was admitted 2 weeks ago by Leonard SchwartzHoyt in our clinic. He was started on Santyl and silver alginate and reports doing the dressing changes. He reports finishing Bactrim. He also completed his course of Bactrim. He is reporting continued pain to his foot and  states that there is more redness  to his toes. He does not recall having imaging done recently of his foot. 06/22/2021 upon evaluation today patient appears to be doing about the same in regard to his wounds. I am seeing some of the slough from the base of the wound the main wound between his fifth and third toes where he had the amputation. With that being said this is loosening up which is great news. Fortunately there does not appear to be any signs of active infection at this time which is good although he has been placed back on antibiotics by vascular he walked into see them yesterday. They also gave him a refill the pain medication. With that being said looking at the wound currently is starting to clean up I am seeing some granulation in the base my hope is with the fact that it was documented yesterday that he had brisk pulses in the DP and PT that he can heal this wound. Is probably just can take some time. With that being said I do believe based on what I see currently that the ideal thing may be for [place] to see about initiating a wound VAC. Objective Constitutional Well-nourished and well-hydrated in no acute distress. Vitals Time Taken: 9:43 AM, Height: 68 in, Weight: 214 lbs, BMI: 32.5, Temperature: 98.6 F, Pulse: 81 bpm, Respiratory Rate: 18 breaths/min, Blood Pressure: 107/80 mmHg, Capillary Blood Glucose: 123 mg/dl. Respiratory normal breathing without difficulty. Psychiatric this patient is able to make decisions and demonstrates good insight into disease process. Alert and Oriented x 3. pleasant and cooperative. General Notes: Upon inspection patient's wound bed actually showed signs of some necrotic tissue in the base of the wound. This is mainly some slough and necrotic fascia/muscle. Subsequently I did clear this away as carefully as I could today there was literally no bleeding but this is due to the fact that I was very careful not to cut too deeply into the wound I do want a make anything worse. The patient  tolerated that today without complication and postdebridement it looks better there is some noted granulation tissue in the base of the wound which is great news. Integumentary (Hair, Skin) Wound #1 status is Open. Original cause of wound was Surgical Injury. The date acquired was: 03/18/2021. The wound has been in treatment 5 weeks. The wound is located on the Left Amputation Site - T The wound measures 1cm length x 0.8cm width x 2.3cm depth; 0.628cm^2 area and 1.445cm^3 volume. oe. There is Fat Layer (Subcutaneous Tissue) exposed. There is no tunneling or undermining noted. There is a medium amount of serosanguineous drainage noted. The wound margin is flat and intact. There is small (1-33%) pink granulation within the wound bed. There is a large (67-100%) amount of necrotic tissue within the wound bed including Adherent Slough. Wound #2 status is Open. Original cause of wound was Gradually Appeared. The date acquired was: 04/20/2021. The wound has been in treatment 5 weeks. The wound is located on the Left,Dorsal T Second. The wound measures 0.5cm length x 0.5cm width x 0.2cm depth; 0.196cm^2 area and 0.039cm^3 volume. oe There is no tunneling or undermining noted. There is a medium amount of serosanguineous drainage noted. The wound margin is flat and intact. There is no granulation within the wound bed. There is a large (67-100%) amount of necrotic tissue within the wound bed including Adherent Slough. Wound #3 status is Open. Original cause of wound was Gradually Appeared. The date acquired was:  04/20/2021. The wound has been in treatment 5 weeks. The wound is located on the Left T Fifth. The wound measures 0.4cm length x 0.5cm width x 0.1cm depth; 0.157cm^2 area and 0.016cm^3 volume. There is Fat oe Layer (Subcutaneous Tissue) exposed. There is no tunneling or undermining noted. There is a medium amount of serosanguineous drainage noted. The wound margin is flat and intact. There is small (1-33%)  pink granulation within the wound bed. There is a large (67-100%) amount of necrotic tissue within the wound bed including Eschar and Adherent Slough. 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 There was a Excisional Skin/Subcutaneous Tissue/Muscle Debridement with a total area of 0.8 sq cm performed by Lenda Kelp, PA. With the following instrument(s): Forceps, and Scissors to remove Non-Viable tissue/material. Material removed includes Muscle, Slough, Fascia, and Skin: Dermis after achieving pain control using Other (Benzocaine). No specimens were taken. A time out was conducted at 10:22, prior to the start of the procedure. A Minimum amount of bleeding was controlled with Pressure. The procedure was tolerated well. Post Debridement Measurements: 1cm length x 0.8cm width x 2.3cm depth; 1.445cm^3 volume. Character of Wound/Ulcer Post Debridement is stable. Severity of Tissue Post Debridement is: Fat layer exposed. Post procedure Diagnosis Wound #1: Same as Pre-Procedure Plan Follow-up Appointments: Return Appointment in 1 week. - with Matthieu Loftus on Wednesday Bathing/ Shower/ Hygiene: May shower and wash wound with soap and water. - with dressing changes Negative Presssure Wound Therapy: Wound Vac to  wound continuously at 17mm/hg pressure - Bridge to top of foot-change 3x week Black Foam Edema Control - Lymphedema / SCD / Other: Elevate legs to the level of the heart or above for 30 minutes daily and/or when sitting, a frequency of: Avoid standing for long periods of time. Off-Loading: Wedge shoe to: - front offloading shoe to left foot Additional Orders / Instructions: Follow Nutritious Diet Home Health: New wound care orders this week; continue Home Health for wound care. May utilize formulary equivalent dressing for wound treatment orders unless otherwise specified. - Ordering wound vac- when obtained go ahead and apply. Other Home Health Orders/Instructions: - [facility] WOUND #1: - Amputation Site - T oe Wound Laterality: Left Prim Dressing: KerraCel Ag Gelling Fiber Dressing, 2x2 in (silver alginate) (Dispense As Written) 1 x Per Day/15 Days ary Discharge Instructions: Apply silver alginate to wound bed pack lightly into empty space Prim Dressing: Santyl Ointment 1 x Per Day/15 Days ary Discharge Instructions: Apply nickel thick amount to wound bed as instructed Secondary Dressing: Woven Gauze Sponges 2x2 in (Generic) 1 x Per Day/15 Days Discharge Instructions: Apply over primary dressing as directed. Secured With: Insurance underwriter, Sterile 2x75 (in/in) (Generic) 1 x Per Day/15 Days Discharge Instructions: Secure with stretch gauze as directed. WOUND #2: - T Second Wound Laterality: Dorsal, Left oe Prim Dressing: Santyl Ointment 1 x Per Day/30 Days ary Discharge Instructions: Apply nickel thick amount to  wound bed as instructed Secondary Dressing: Woven Gauze Sponges 2x2 in (Generic) 1 x Per Day/30 Days Discharge Instructions: Apply over primary dressing as directed. Secured With: Insurance underwriter, Sterile 2x75 (in/in) 1 x Per Day/30 Days Discharge Instructions: Secure with stretch gauze as directed. WOUND #3: - T Fifth Wound Laterality:  Left oe Prim Dressing: Santyl Ointment 1 x Per Day/30 Days ary Discharge Instructions: Apply nickel thick amount to wound bed as instructed Secondary Dressing: Woven Gauze Sponges 2x2 in (Generic) 1 x Per Day/30 Days Discharge Instructions: Apply over primary dressing as directed. Secured With: Insurance underwriter, Sterile 2x75 (in/in) 1 x Per Day/30 Days Discharge Instructions: Secure with stretch gauze as directed. 1. I think at this point based on what I am seeing that the patient would benefit most from a wound VAC for the region in between the fifth and third toe where he had the amputation site. I think this could help to try to granulate things in in fact I think that is probably his best option if he wants to try to save from proceeding with a transmetatarsal or even below-knee amputation. He states that he really does not want to consider either 1 of those whatsoever. 2. I would recommend we continue the meantime with Santyl for all wound locations using some alginate packed and behind the Santyl for the amputation site. 3. We will go ahead and initiate treatment with a wound VAC 3 times a week changes black foam only I think this can be a good way to try to help him out as well. Santyl can be used underneath the wound VAC as well. We will see patient back for reevaluation in 1 week here in the clinic. If anything worsens or changes patient will contact our office for additional recommendations. I want to keep a close eye on things to see how is doing especially with initiation of the wound VAC I want a make sure this is working appropriately. Electronic Signature(s) Signed: 07/02/2021 10:38:02 AM By: Lenda Kelp PA-C Entered By: Lenda Kelp on 07/02/2021 10:38:02 -------------------------------------------------------------------------------- SuperBill Details Patient Name: Date of Service: [name] 07/02/2021 Medical Record Number: [id number] Patient  Account Number: [id number] Date of Birth/Sex: Treating RN: 1946-08-21 (75 y.o. 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) I10 Essential (primary) hypertension I25.10 Atherosclerotic heart disease of native coronary artery without angina pectoris G47.30 Sleep apnea, unspecified Facility Procedures CPT4 Code: [id number] 1 Description: 1043 - DEB MUSC/FASCIA 20 SQ CM/< ICD-10 Diagnosis Description L97.528 Non-pressure chronic ulcer of other part of left foot with other specified sever Modifier: ity Quantity: 1 Physician Procedures : CPT4 Code Description Modifier [id number] - WC PHYS LEVEL 4 - EST PT 25 ICD-10 Diagnosis Description T81.31XA Disruption of external operation (surgical) wound, not elsewhere classified, initial encounter E11.621 Type 2 diabetes mellitus with foot  ulcer 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

## 2021-07-03 ENCOUNTER — Ambulatory Visit: Payer: No Typology Code available for payment source

## 2021-07-07 NOTE — Progress Notes (Signed)
[name], [name] ([id number]) Visit Report for 07/02/2021 Arrival Information Details Patient Name: Date of Service: [name], [name] 07/02/2021 9:15 A M Medical Record Number: [id number] Patient Account Number: [id number] Date of Birth/Sex: Treating RN: 21-Dec-1946 (75 y.o. [name] Primary Care [name]: [facility] Other Clinician: Referring [name]: Treating [name]/Extender: [name], [name]: 5 Visit Information History Since Last Visit Added or deleted any medications: No Patient Arrived: Ambulatory Any new allergies or adverse reactions: No Arrival Time: 09:42 Had a fall or experienced change in No Accompanied By: self activities of daily living that may affect Transfer Assistance: None risk of falls: Patient Identification Verified: Yes Signs or symptoms of abuse/neglect since last visito No Secondary Verification Process Completed: Yes Hospitalized since last visit: No Patient Requires Transmission-Based Precautions: No Implantable device outside of the clinic excluding No Patient Has Alerts: Yes cellular tissue based products placed in the center Patient Alerts: L ABI: [name] TBI: 0.30 since last visit: R ABI: [name] TBI: 0.32 Has Dressing in Place as Prescribed: Yes Pain Present Now: Yes Electronic Signature(s) Signed: 07/07/2021 3:49:49 PM By: [name] Entered By: [name] on 07/02/2021 09:43:13 -------------------------------------------------------------------------------- Encounter Discharge Information Details Patient Name: Date of Service: [name] Nap. 07/02/2021 9:15 A M Medical Record Number: [id number] Patient Account Number: [id number] Date of Birth/Sex: Treating RN: 05/16/1946 (75 y.o. [name] Primary Care [name]: [facility] Other Clinician: Referring [name]: Treating [name]/Extender: [name], [name]: 5 Encounter Discharge Information Items Post  Procedure Vitals Discharge Condition: Stable Temperature (F): 98.6 Ambulatory Status: Ambulatory Pulse (bpm): 81 Discharge Destination: Home Respiratory Rate (breaths/min): 18 Transportation: Private Auto Blood Pressure (mmHg): 107/80 Accompanied By: self Schedule Follow-up Appointment: Yes Clinical Summary of Care: Electronic Signature(s) Signed: 07/02/2021 6:29:13 PM By: [name] Entered By: [name] on 07/02/2021 12:28:04 -------------------------------------------------------------------------------- Lower Extremity Assessment Details Patient Name: Date of Service: [name], [name] 07/02/2021 9:15 A M Medical Record Number: [id number] Patient Account Number: [id number] Date of Birth/Sex: Treating RN: 12-07-1946 (75 y.o. [name] Primary Care [name]: [facility] Other Clinician: Referring [name]: Treating [name]/Extender: [name], [name]: 5 Edema Assessment Assessed: [Left: Yes] [Right: No] Edema: [Left: Ye] [Right: s] Calf Left: Right: Point of Measurement: 31 cm From Medial Instep 42 cm Ankle Left: Right: Point of Measurement: 10 cm From Medial Instep 23 cm Vascular Assessment Pulses: Dorsalis Pedis Palpable: [Left:Yes] Electronic Signature(s) Signed: 07/02/2021 6:29:13 PM By: [name] Entered By: [name] on 07/02/2021 10:03:00 -------------------------------------------------------------------------------- [name] Details Patient Name: Date of Service: [name] Nap. 07/02/2021 9:15 A M Medical Record Number: [id number] Patient Account Number: [id number] Date of Birth/Sex: Treating RN: 25-Feb-1946 (75 y.o. [name] Primary Care [name]: [facility] Other Clinician: Referring [name]: Treating [name]/Extender: [name], [name]: 5 Multidisciplinary Care Plan reviewed with physician Active Inactive Nutrition Nursing  Diagnoses: Impaired glucose control: actual or potential Potential for alteratiion in Nutrition/Potential for imbalanced nutrition Goals: Patient/caregiver will maintain therapeutic glucose control Date Initiated: 05/28/2021 Target Resolution Date: 08/06/2021 Goal Status: Active Interventions: Assess HgA1c results as ordered upon admission and as needed Assess patient nutrition upon admission and as needed per policy Provide education on elevated blood sugars and impact on wound healing Treatment Activities: Patient referred to Primary Care Physician for further nutritional evaluation : 05/28/2021 Notes: 07/02/21:Glucose control ongoing Wound/Skin Impairment Nursing Diagnoses:  Impaired tissue integrity Knowledge deficit related to ulceration/compromised skin integrity Goals: Patient/caregiver will verbalize understanding of skin care regimen Date Initiated: 05/28/2021 Date Inactivated: 07/02/2021 Target Resolution Date: 06/25/2021 Goal Status: Met Ulcer/skin breakdown will have a volume reduction of 30% by week 4 Date Initiated: 05/28/2021 Target Resolution Date: 07/16/2021 Goal Status: Active Interventions: Assess patient/caregiver ability to obtain necessary supplies Assess patient/caregiver ability to perform ulcer/skin care regimen upon admission and as needed Assess ulceration(s) every visit Provide education on ulcer and skin care Treatment Activities: Skin care regimen initiated : 05/28/2021 Topical wound management initiated : 05/28/2021 Notes: 07/02/21: Wounds not yet at 30% volume reduction, on antibiotics. Patient still to get xray. Electronic Signature(s) Signed: 07/02/2021 6:05:01 PM By: [name] Entered By: [name] on 07/02/2021 10:24:47 -------------------------------------------------------------------------------- Pain Assessment Details Patient Name: Date of Service: [name], [name] 07/02/2021 9:15 A M Medical Record Number: [id number] Patient Account  Number: [id number] Date of Birth/Sex: Treating RN: 07/14/1946 (75 y.o. [name] Primary Care Ori Trejos: [facility] Other Clinician: Referring Ziyonna Christner: Treating [name]/Extender: [name], [name]: 5 Active Problems Location of Pain Severity and Description of Pain Patient Has Paino Yes Site Locations Rate the pain. Rate the pain. Current Pain Level: 10 Pain Management and Medication Current Pain Management: Electronic Signature(s) Signed: 07/02/2021 6:05:01 PM By: [name] Signed: 07/07/2021 3:49:49 PM By: [name] Entered By: [name] on 07/02/2021 09:45:27 -------------------------------------------------------------------------------- Patient/Caregiver Education Details Patient Name: Date of Service: [name] Nap 7/13/2022andnbsp9:15 A M Medical Record Number: [id number] Patient Account Number: [id number] Date of Birth/Gender: Treating RN: 07/01/1946 (75 y.o. [name] Primary Care Physician: [facility] Other Clinician: Referring Physician: Treating Physician/Extender: [name], [name]: 5 Education Assessment Education Provided To: Patient Education Topics Provided Elevated Blood Sugar/ Impact on Healing: Methods: Explain/Verbal Responses: State content correctly Offloading: Methods: Explain/Verbal, Printed Responses: State content correctly Wound/Skin Impairment: Methods: Demonstration, Explain/Verbal Responses: State content correctly Electronic Signature(s) Signed: 07/02/2021 6:05:01 PM By: [name] Entered By: [name] on 07/02/2021 10:23:07 -------------------------------------------------------------------------------- Wound Assessment Details Patient Name: Date of Service: [name] Nap 07/02/2021 9:15 A M Medical Record Number: [id number] Patient Account Number: [id number] Date of Birth/Sex: Treating RN: [birth year] (75 y.o. 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 Wounding Event: Surgical Injury Etiology: Date Acquired: 03/18/2021 Wound Open Weeks Of Treatment: 5 Status: Clustered Wound: No Comorbid Sleep Apnea, Coronary Artery Disease, Hypertension, Myocardial History: Infarction, Peripheral Arterial Disease, Type II Diabetes, Neuropathy Photos Wound Measurements Length: (cm) 1 Width: (cm) 0.8 Depth: (cm) 2.3 Area: (cm) 0.628 Volume: (cm) 1.445 % Reduction in Area: 46.7% % Reduction in Volume: -22.7% Epithelialization: None Tunneling: No Undermining: No Wound Description Classification: Full Thickness Without Exposed Support Structures Wound Margin: Flat and Intact Exudate Amount: Medium Exudate Type: Serosanguineous Exudate Color: red, brown Foul Odor After Cleansing: No Slough/Fibrino Yes Wound Bed Granulation Amount: Small (1-33%) Exposed Structure Granulation Quality: Pink Fascia Exposed: No Necrotic Amount: Large (67-100%) Fat Layer (Subcutaneous Tissue) Exposed: Yes Necrotic Quality: Adherent Slough Tendon Exposed: No Muscle Exposed: No Joint Exposed: No Bone Exposed: No Treatment Notes Wound #1 (Amputation Site - Toe) Wound Laterality: Left Cleanser Peri-Wound Care Topical Primary Dressing KerraCel Ag  Gelling Fiber Dressing, 2x2 in (silver alginate) Discharge Instruction: Apply silver alginate to wound bed pack lightly into empty space Santyl Ointment Discharge Instruction: Apply nickel thick amount to wound bed as instructed Secondary Dressing Woven Gauze Sponges 2x2 in Discharge Instruction: Apply over primary dressing as directed. Secured With Conforming Stretch Gauze  Bandage, Sterile 2x75 (in/in) Discharge Instruction: Secure with stretch gauze as directed. Compression Wrap Compression Stockings Add-Ons Electronic Signature(s) Signed: 07/03/2021 6:01:46 PM By: [name] Signed: 07/07/2021 3:49:49 PM By: [name] Previous Signature: 07/02/2021 6:05:01 PM Version By: [name] Previous Signature: 07/02/2021 6:29:13 PM Version By: [name] Entered By: [name] on 07/03/2021 09:00:22 -------------------------------------------------------------------------------- Wound Assessment Details Patient Name: Date of Service: [name], [name] 07/02/2021 9:15 A M Medical Record Number: [id number] Patient Account Number: [id number] Date of Birth/Sex: Treating RN: [birth year] (75 y.o. [name] Primary Care Christee Mervine: [facility] Other Clinician: Referring Darlene Brozowski: Treating Paticia Moster/Extender: [name], [name]: 5 Wound Status Wound Number: 2 Primary Diabetic Wound/Ulcer of the Lower Extremity Etiology: Wound Location: Left, Dorsal T Second oe Wound Open Wounding Event: Gradually Appeared Status: Date Acquired: 04/20/2021 Comorbid Sleep Apnea, Coronary Artery Disease, Hypertension, Myocardial Weeks Of Treatment: 5 History: Infarction, Peripheral Arterial Disease, Type II Diabetes, Clustered Wound: No Neuropathy Photos Wound Measurements Length: (cm) 0.5 Width: (cm) 0.5 Depth: (cm) 0.2 Area: (cm) 0.196 Volume: (cm) 0.039 % Reduction in Area: 40.6% % Reduction in Volume: -18.2% Epithelialization: None Tunneling: No Undermining: No Wound Description Classification: Grade 2 Wound Margin: Flat and Intact Exudate Amount: Medium Exudate Type: Serosanguineous Exudate Color: red, brown Foul Odor After Cleansing: No Slough/Fibrino Yes Wound Bed Granulation Amount: None Present (0%) Exposed Structure Necrotic Amount: Large (67-100%) Fascia Exposed: No Necrotic Quality: Adherent  Slough Fat Layer (Subcutaneous Tissue) Exposed: No Tendon Exposed: No Muscle Exposed: No Joint Exposed: No Bone Exposed: No Treatment Notes Wound #2 (Toe Second) Wound Laterality: Dorsal, Left Cleanser Peri-Wound Care Topical Primary Dressing Santyl Ointment Discharge Instruction: Apply nickel thick amount to wound bed as instructed Secondary Dressing Woven Gauze Sponges 2x2 in Discharge Instruction: Apply over primary dressing as directed. Secured With Conforming Stretch Gauze Bandage, Sterile 2x75 (in/in) Discharge Instruction: Secure with stretch gauze as directed. Compression Wrap Compression Stockings Add-Ons Electronic Signature(s) Signed: 07/03/2021 6:01:46 PM By: [name] Signed: 07/07/2021 3:49:49 PM By: [name] Previous Signature: 07/02/2021 6:05:01 PM Version By: [name] Previous Signature: 07/02/2021 6:29:13 PM Version By: [name] Entered By: [name] on 07/03/2021 08:58:46 -------------------------------------------------------------------------------- Wound Assessment Details Patient Name: Date of Service: [name], [name] 07/02/2021 9:15 A M Medical Record Number: [id number] Patient Account Number: [id number] Date of Birth/Sex: Treating RN: [birth year] (75 y.o. [name] Primary Care Arlow Spiers: [facility] Other Clinician: Referring Shatasia Cutshaw: Treating Orlan Aversa/Extender: [name], [name]: 5 Wound Status Wound Number: 3 Primary Diabetic Wound/Ulcer of the Lower Extremity Etiology: Wound Location: Left T Fifth oe Wound Open Wounding Event: Gradually Appeared Status: Date Acquired: 04/20/2021 Comorbid Sleep Apnea, Coronary Artery Disease, Hypertension, Myocardial Weeks Of Treatment: 5 History: Infarction, Peripheral Arterial Disease, Type II Diabetes, Clustered Wound: No Neuropathy Photos Wound Measurements Length: (cm) 0.4 Width: (cm) 0.5 Depth: (cm) 0.1 Area: (cm)  0.157 Volume: (cm) 0.016 % Reduction in Area: 19.9% % Reduction in Volume: 20% Epithelialization: None Tunneling: No Undermining: No Wound Description Classification: Grade 2 Wound Margin: Flat and Intact Exudate Amount: Medium Exudate Type: Serosanguineous Exudate Color: red, brown Foul Odor After  Cleansing: No Slough/Fibrino Yes Wound Bed Granulation Amount: Small (1-33%) Exposed Structure Granulation Quality: Pink Fascia Exposed: No Necrotic Amount: Large (67-100%) Fat Layer (Subcutaneous Tissue) Exposed: Yes Necrotic Quality: Eschar, Adherent Slough Tendon Exposed: No Muscle Exposed: No Joint Exposed: No Bone Exposed: No Treatment Notes Wound #3 (Toe Fifth) Wound Laterality: Left Cleanser Peri-Wound Care Topical Primary Dressing Santyl Ointment Discharge Instruction: Apply nickel thick amount to wound bed as instructed Secondary Dressing Woven Gauze Sponges 2x2 in Discharge Instruction: Apply over primary dressing as directed. Secured With Conforming Stretch Gauze Bandage, Sterile 2x75 (in/in) Discharge Instruction: Secure with stretch gauze as directed. Compression Wrap Compression Stockings Add-Ons Electronic Signature(s) Signed: 07/03/2021 6:01:46 PM By: [name] Signed: 07/07/2021 3:49:49 PM By: [name] Previous Signature: 07/02/2021 6:05:01 PM Version By: [name] Previous Signature: 07/02/2021 6:29:13 PM Version By: [name] Entered By: [name] on 07/03/2021 08:59:31 -------------------------------------------------------------------------------- [name] Details Patient Name: Date of Service: [name] Nap. 07/02/2021 9:15 A M Medical Record Number: [id number] Patient Account Number: [id number] Date of Birth/Sex: Treating RN: 29-Jul-1946 (75 y.o. [name] Primary Care Shacara Cozine: [facility] Other Clinician: Referring Tomi Grandpre: Treating Adriana Lina/Extender: [name], [name]:  5 Vital Signs Time Taken: 09:43 Temperature (F): 98.6 Height (in): 68 Pulse (bpm): 81 Weight (lbs): 214 Respiratory Rate (breaths/min): 18 Body Mass Index (BMI): 32.5 Blood Pressure (mmHg): 107/80 Capillary Blood Glucose (mg/dl): 123 Reference Range: 80 - 120 mg / dl Electronic Signature(s) Signed: 07/07/2021 3:49:49 PM By: [name] Entered By: [name] on 07/02/2021 09:45:16

## 2021-07-08 NOTE — Progress Notes (Signed)
TALBOT, MONARCH ([id number]) Visit Report for 06/12/2021 Arrival Information Details Patient Name: Date of Service: [name], [name] 06/12/2021 10:45 A M Medical Record Number: [id number] Patient Account Number: [id number] Date of Birth/Sex: Treating RN: 22-Nov-1946 (75 y.o. [name] Primary Care Tomma Ehinger: [facility]ENTER, [facility] A Other Clinician: Referring Shaughn Thomley: Treating Ka Bench/Extender: Geralyn Corwin [facility], [name]: 2 Visit Information History Since Last Visit Added or deleted any medications: No Patient Arrived: Ambulatory Any new allergies or adverse reactions: No Arrival Time: 11:33 Had a fall or experienced change in No Accompanied By: SELF activities of daily living that may affect Transfer Assistance: None risk of falls: Patient Identification Verified: Yes Signs or symptoms of abuse/neglect since last visito No Secondary Verification Process Completed: Yes Hospitalized since last visit: No Patient Requires Transmission-Based Precautions: No Implantable device outside of the clinic excluding No Patient Has Alerts: Yes cellular tissue based products placed in the center Patient Alerts: L ABI: Shattuck TBI: 0.30 since last visit: R ABI:  TBI: 0.32 Has Dressing in Place as Prescribed: Yes Pain Present Now: Yes Electronic Signature(s) Signed: 06/13/2021 6:18:32 PM By: Fonnie Mu RN Entered By: Fonnie Mu on 06/12/2021 11:34:08 -------------------------------------------------------------------------------- Clinic Level of Care Assessment Details Patient Name: Date of Service: [name] 06/12/2021 10:45 A M Medical Record Number: [id number] Patient Account Number: [id number] Date of Birth/Sex: Treating RN: [birth year] (75 y.o. [name] Primary Care Joniah Bednarski: [facility], [facility] A Other Clinician: Referring Lucy Boardman: Treating Lige Lakeman/Extender: Geralyn Corwin [facility], [name]: 2 Clinic Level of Care  Assessment Items TOOL 4 Quantity Score X- 1 0 Use when only an EandM is performed on FOLLOW-UP visit ASSESSMENTS - Nursing Assessment / Reassessment X- 1 10 Reassessment of Co-morbidities (includes updates in patient status) X- 1 5 Reassessment of Adherence to Treatment Plan ASSESSMENTS - Wound and Skin A ssessment / Reassessment  - 0 Simple Wound Assessment / Reassessment - one wound X- 3 5 Complex Wound Assessment / Reassessment - multiple wounds  - 0 Dermatologic / Skin Assessment (not related to wound area) ASSESSMENTS - Focused Assessment  - 0 Circumferential Edema Measurements - multi extremities  - 0 Nutritional Assessment / Counseling / Intervention  - 0 Lower Extremity Assessment (monofilament, tuning fork, pulses)  - 0 Peripheral Arterial Disease Assessment (using hand held doppler) ASSESSMENTS - Ostomy and/or Continence Assessment and Care  - 0 Incontinence Assessment and Management  - 0 Ostomy Care Assessment and Management (repouching, etc.) PROCESS - Coordination of Care  - 0 Simple Patient / Family Education for ongoing care X- 1 20 Complex (extensive) Patient / Family Education for ongoing care X- 1 10 Staff obtains Chiropractor, Records, T Results / Process Orders est X- 1 10 Staff telephones HHA, Nursing Homes / Clarify orders / etc  - 0 Routine Transfer to another Facility (non-emergent condition)  - 0 Routine Hospital Admission (non-emergent condition)  - 0 New Admissions / Manufacturing engineer / Ordering NPWT Apligraf, etc. ,  - 0 Emergency Hospital Admission (emergent condition) X- 1 10 Simple Discharge Coordination  - 0 Complex (extensive) Discharge Coordination PROCESS - Special Needs  - 0 Pediatric / Minor Patient Management  - 0 Isolation Patient Management  - 0 Hearing / Language / Visual special needs  - 0 Assessment of Community assistance (transportation, D/C planning, etc.)  -  0 Additional assistance / Altered mentation  - 0 Support Surface(s) Assessment (bed, cushion, seat, etc.) INTERVENTIONS - Wound Cleansing / Measurement  - 0 Simple  Wound Cleansing - one wound X- 3 5 Complex Wound Cleansing - multiple wounds X- 1 5 Wound Imaging (photographs - any number of wounds) []  - 0 Wound Tracing (instead of photographs) []  - 0 Simple Wound Measurement - one wound X- 3 5 Complex Wound Measurement - multiple wounds INTERVENTIONS - Wound Dressings []  - 0 Small Wound Dressing one or multiple wounds X- 3 15 Medium Wound Dressing one or multiple wounds []  - 0 Large Wound Dressing one or multiple wounds []  - 0 Application of Medications - topical []  - 0 Application of Medications - injection INTERVENTIONS - Miscellaneous []  - 0 External ear exam []  - 0 Specimen Collection (cultures, biopsies, blood, body fluids, etc.) []  - 0 Specimen(s) / Culture(s) sent or taken to Lab for analysis []  - 0 Patient Transfer (multiple staff / / Similar devices) []  - 0 Simple Staple / Suture removal (25 or less) []  - 0 Complex Staple / Suture removal (26 or more) []  - 0 Hypo / Hyperglycemic Management (close monitor of Blood Glucose) []  - 0 Ankle / Brachial Index (ABI) - do not check if billed separately X- 1 5 Vital Signs Has the patient been seen at the hospital within the last three years: Yes Total Score: 165 Level Of Care: New/Established - Level 5 Electronic Signature(s) Signed: 06/13/2021 6:18:32 PM By: RN Entered By: on 06/12/2021 16:56:18 -------------------------------------------------------------------------------- Encounter Discharge Information Details Patient Name: Date of Service: 06/12/2021 10:45 A M Medical Record Number: Patient Account Number: Date of Birth/Sex: Treating RN: 1946/06/25 (75 y.o. Primary Care Kialee Kham: CENTER, [facility] A Other  Clinician: Referring Cedarius Kersh: Treating Shooter Tangen/Extender: , [name]: 2 Encounter Discharge Information Items Discharge Condition: Stable Ambulatory Status: Wheelchair Discharge Destination: Home Transportation: Private Auto Accompanied By: spouse Schedule Follow-up Appointment: No Clinical Summary of Care: Provided Form Type Recipient Paper Patient patient Electronic Signature(s) Signed: 07/08/2021 9:46:35 PM By: Previous Signature: 07/07/2021 9:19:03 PM Version By: Fonnie Mu Entered By: Fonnie Mu on 07/08/2021 21:46:24 -------------------------------------------------------------------------------- Lower Extremity Assessment Details Patient Name: Date of Service: ELIJAHJAMES, FUELLING 06/12/2021 10:45 A M Medical Record Number: [id number] Patient Account Number: [id number] Date of Birth/Sex: Treating RN: 06-28-1946 (75 y.o. Tammy Sours Primary Care Dynasti Kerman: CENTER, [facility] A Other Clinician: Referring Banyan Goodchild: Treating Mattheu Brodersen/Extender: Leslie Andrea, [name]: 2 Edema Assessment Assessed: [Left: Yes] [Right: No] Edema: [Left: Ye] [Right: s] Calf Left: Right: Point of Measurement: 31 cm From Medial Instep 42 cm Ankle Left: Right: Point of Measurement: 10 cm From Medial Instep 24 cm Vascular Assessment Pulses: Dorsalis Pedis Palpable: [Left:Yes] Posterior Tibial Palpable: [Left:Yes] Electronic Signature(s) Signed: 06/13/2021 6:18:32 PM By: Shawn Stall RN Entered By: 07/09/2021 on 06/12/2021 11:32:04 -------------------------------------------------------------------------------- Multi Wound Chart Details Patient Name: Date of Service: Shawn Stall 06/12/2021 10:45 A M Medical Record Number: [name] Patient Account Number: 06/14/2021 Date of Birth/Sex: Treating RN: 1946/09/01 (75 y.o. 08/11/1946 Primary Care Breyanna Valera: CENTER, [facility] A Other  Clinician: Referring Nitisha Civello: Treating Elease Swarm/Extender: 66, [name]: 2 Vital Signs Height(in): 68 Capillary Blood Glucose(mg/dl): [name] Weight(lbs): Leslie Andrea Pulse(bpm): 91 Body Mass Index(BMI): 33 Blood Pressure(mmHg): 108/74 Temperature(F): 97.7 Respiratory Rate(breaths/min): 18 Photos: [1:No Photos Left Amputation Site - Toe] [2:No Photos Left, Dorsal T Second oe] [3:No Photos Left T Fifth oe] Wound Location: [1:Surgical Injury] [2:Gradually Appeared] [3:Gradually Appeared] Wounding Event: [1:Open Surgical  Wound] [2:Diabetic Wound/Ulcer of the Lower] [3:Diabetic Wound/Ulcer of the Lower] Primary Etiology: [1:Diabetic Wound/Ulcer of the Lower] [2:Extremity N/A] [3:Extremity N/A] Secondary Etiology: [1:Extremity Sleep Apnea, Coronary Artery] [2:Sleep Apnea, Coronary Artery] [3:Sleep Apnea, Coronary Artery] Comorbid History: [1:Disease, Hypertension, Myocardial Disease, Hypertension, Myocardial Infarction, Peripheral Arterial Disease, Infarction, Peripheral Arterial Disease, Type II Diabetes, Neuropathy 03/18/2021] [2:Type II Diabetes, Neuropathy 04/20/2021]  [3:Disease, Hypertension, Myocardial Infarction, Peripheral Arterial Disease, Type II Diabetes, Neuropathy 04/20/2021] Date Acquired: [1:2] [2:2] [3:2] Weeks of Treatment: [1:Open] [2:Open] [3:Open] Wound Status: [1:1.2x1.2x2.5] [2:0.6x0.5x0.2] [3:0.3x0.2x0.1] Measurements L x W x D (cm) [1:1.131] [2:0.236] [3:0.047] A (cm) : rea [1:2.827] [2:0.047] [3:0.005] Volume (cm) : [1:4.00%] [2:28.50%] [3:76.00%] % Reduction in Area: [1:-140.00%] [2:-42.40%] [3:75.00%] % Reduction in Volume: [1:Full Thickness Without Exposed] [2:Unable to visualize wound bed] [3:Unable to visualize wound bed] Classification: [1:Support Structures Medium] [2:Medium] [3:Medium] Exudate A mount: [1:Serosanguineous] [2:Serosanguineous] [3:Serosanguineous] Exudate Type: [1:red, brown] [2:red, brown] [3:red, brown] Exudate Color:  [1:Flat and Intact] [2:Flat and Intact] [3:Flat and Intact] Wound Margin: [1:Medium (34-66%)] [2:None Present (0%)] [3:None Present (0%)] Granulation Amount: [1:Pink] [2:N/A] [3:N/A] Granulation Quality: [1:Medium (34-66%)] [2:Large (67-100%)] [3:Large (67-100%)] Necrotic Amount: [1:Adherent Slough] [2:N/A] [3:Eschar] Necrotic Tissue: [1:Fat Layer (Subcutaneous Tissue): Yes Fascia: No] [3:Fascia: No] Exposed Structures: [1:Fascia: No Tendon: No Muscle: No Joint: No Bone: No None] [2:Fat Layer (Subcutaneous Tissue): No Tendon: No Muscle: No Joint: No Bone: No None] [3:Fat Layer (Subcutaneous Tissue): No Tendon: No Muscle: No Joint: No Bone: No None] Treatment Notes Electronic Signature(s) Signed: 06/12/2021 2:49:56 PM By: Geralyn CorwinHoffman, Jessica DO Signed: 06/13/2021 6:18:32 PM By: Fonnie MuBreedlove, Lauren RN Entered By: Geralyn CorwinHoffman, Jessica on 06/12/2021 13:43:00 -------------------------------------------------------------------------------- Multi-Disciplinary Care Plan Details Patient Name: Date of Service: Marlou PorchUSSELL, [name] 06/12/2021 10:45 A M Medical Record Number: [id number] Patient Account Number: [id number] Date of Birth/Sex: Treating RN: 11-Oct-1946 (75 y.o. [name] GrovesM) Breedlove, Lauren Primary Care Shannen Vernon: CENTER, [facility] A Other Clinician: Referring Paxtyn Wisdom: Treating Terrace Chiem/Extender: Leslie AndreaHoffman, Jessica [facility], [name]: 2 Multidisciplinary Care Plan reviewed with physician Active Inactive Nutrition Nursing Diagnoses: Impaired glucose control: actual or potential Potential for alteratiion in Nutrition/Potential for imbalanced nutrition Goals: Patient/caregiver will maintain therapeutic glucose control Date Initiated: 05/28/2021 Target Resolution Date: 06/25/2021 Goal Status: Active Interventions: Assess HgA1c results as ordered upon admission and as needed Assess patient nutrition upon admission and as needed per policy Provide education on elevated blood sugars and impact on wound  healing Treatment Activities: Patient referred to Primary Care Physician for further nutritional evaluation : 05/28/2021 Notes: Tissue Oxygenation Nursing Diagnoses: Actual ineffective tissue perfusion; peripheral (select once diagnosis is confirmed) Knowledge deficit related to disease process and management Goals: Patient/caregiver will verbalize understanding of disease process and disease management Date Initiated: 05/28/2021 Target Resolution Date: 06/25/2021 Goal Status: Active Interventions: Assess patient understanding of disease process and management upon diagnosis and as needed Assess peripheral arterial status upon admission and as needed Provide education on tissue oxygenation and ischemia Treatment Activities: T ordered outside of clinic : 05/28/2021 est Notes: Wound/Skin Impairment Nursing Diagnoses: Impaired tissue integrity Knowledge deficit related to ulceration/compromised skin integrity Goals: Patient/caregiver will verbalize understanding of skin care regimen Date Initiated: 05/28/2021 Target Resolution Date: 06/25/2021 Goal Status: Active Ulcer/skin breakdown will have a volume reduction of 30% by week 4 Date Initiated: 05/28/2021 Target Resolution Date: 06/25/2021 Goal Status: Active Interventions: Assess patient/caregiver ability to obtain necessary supplies Assess patient/caregiver ability to perform ulcer/skin care regimen upon admission and as needed Assess ulceration(s) every visit Provide education on ulcer and skin care Treatment  Activities: Skin care regimen initiated : 05/28/2021 Topical wound management initiated : 05/28/2021 Notes: Electronic Signature(s) Signed: 06/13/2021 6:18:32 PM By: Fonnie Mu RN Entered By: Fonnie Mu on 06/12/2021 11:54:27 -------------------------------------------------------------------------------- Pain Assessment Details Patient Name: Date of Service: LAURENS, MATHENY 06/12/2021 10:45 A M Medical Record  Number: [id number] Patient Account Number: [id number] Date of Birth/Sex: Treating RN: 1946-01-29 (75 y.o. [name] Primary Care Rayon Mcchristian: C[facility], [facility] A Other Clinician: Referring Reeve Mallo: Treating Ravon Mcilhenny/Extender: Leslie Andrea, [name]: 2 Active Problems Location of Pain Severity and Description of Pain Patient Has Paino Yes Site Locations Pain Location: Generalized Pain, Pain in Ulcers With Dressing Change: Yes Duration of the Pain. Constant / Intermittento Intermittent Rate the pain. Current Pain Level: 10 Worst Pain Level: 10 Least Pain Level: 0 Tolerable Pain Level: 7 Character of Pain Describe the Pain: Aching Pain Management and Medication Current Pain Management: Medication: No Cold Application: No Rest: No Massage: No Activity: No T.E.N.S.: No Heat Application: No Leg drop or elevation: No Is the Current Pain Management Adequate: Adequate How does your wound impact your activities of daily livingo Sleep: No Bathing: No Appetite: No Relationship With Others: No Bladder Continence: No Emotions: No Bowel Continence: No Work: No Toileting: No Drive: No Dressing: No Hobbies: No Electronic Signature(s) Signed: 06/13/2021 6:18:32 PM By: Fonnie Mu RN Entered By: Fonnie Mu on 06/12/2021 11:32:42 -------------------------------------------------------------------------------- Patient/Caregiver Education Details Patient Name: Date of Service: [name] 6/23/2022andnbsp10:45 A M Medical Record Number: [id number] Patient Account Number: [id number] Date of Birth/Gender: Treating RN: 08/31/1946 (75 y.o. [name] Primary Care Physician: CENTER, [facility] A Other Clinician: Referring Physician: Treating Physician/Extender: Leslie Andrea, [name]: 2 Education Assessment Education Provided To: Patient Education Topics Provided Wound/Skin Impairment: Investment banker, corporate) Signed: 06/13/2021 6:18:32 PM By: Fonnie Mu RN Entered By: Fonnie Mu on 06/12/2021 16:55:07 -------------------------------------------------------------------------------- Wound Assessment Details Patient Name: Date of Service: [name] 06/12/2021 10:45 A M Medical Record Number: [id number] Patient Account Number: [id number] Date of Birth/Sex: Treating RN: 01/14/1946 (75 y.o. 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 Wounding Event: Surgical Injury Etiology: Date Acquired: 03/18/2021 Wound Open Weeks Of Treatment: 2 Status: Clustered Wound: No Comorbid Sleep Apnea, Coronary Artery Disease, Hypertension, Myocardial History: Infarction, Peripheral Arterial Disease, Type II Diabetes, Neuropathy Wound Measurements Length: (cm) 1.2 Width: (cm) 1.2 Depth: (cm) 2.5 Area: (cm) 1.131 Volume: (cm) 2.827 % Reduction in Area: 4% % Reduction in Volume: -140% Epithelialization: None Tunneling: No Undermining: No Wound Description Classification: Full Thickness Without Exposed Support Structures Wound Margin: Flat and Intact Exudate Amount: Medium Exudate Type: Serosanguineous Exudate Color: red, brown Foul Odor After Cleansing: No Slough/Fibrino Yes Wound Bed Granulation Amount: Medium (34-66%) Exposed Structure Granulation Quality: Pink Fascia Exposed: No Necrotic Amount: Medium (34-66%) Fat Layer (Subcutaneous Tissue) Exposed: Yes Necrotic Quality: Adherent Slough Tendon Exposed: No Muscle Exposed: No Joint Exposed: No Bone Exposed: No Electronic Signature(s) Signed: 06/13/2021 6:18:32 PM By: Fonnie Mu RN Entered By: Fonnie Mu on 06/12/2021 11:30:48 -------------------------------------------------------------------------------- Wound Assessment Details Patient Name: Date of Service: [name] 06/12/2021 10:45 A M Medical Record Number: [id number] Patient Account Number: [id number] Date of Birth/Sex: Treating RN: 1946-10-12 (75 y.o. Charlean Merl, Lauren Primary  Care Zahra Peffley: CENTER, V A Other Clinician: Referring Savahna Casados: Treating Michaelene Dutan/Extender: Geralyn Corwin CENTER, [name]: 2 Wound Status Wound Number: 2 Primary Diabetic Wound/Ulcer of the Lower Extremity Etiology: Wound Location: Left, Dorsal T Second oe Wound Open Wounding Event: Gradually Appeared Status: Date Acquired: 04/20/2021 Comorbid Sleep Apnea, Coronary Artery Disease, Hypertension, Myocardial Weeks Of Treatment: 2 History: Infarction, Peripheral Arterial Disease, Type II Diabetes, Neuropathy Clustered Wound: No Photos Wound Measurements Length: (cm) 0.6 Width: (cm) 0.5 Depth: (cm) 0.2 Area: (cm) 0.236 Volume: (cm) 0.047 % Reduction in Area: 28.5% % Reduction in Volume: -42.4% Epithelialization: None Tunneling: No Undermining: No Wound Description Classification: Unable to visualize wound bed Wound Margin: Flat and Intact Exudate Amount: Medium Exudate Type: Serosanguineous Exudate Color: red, brown Foul Odor After Cleansing: No Slough/Fibrino Yes Wound Bed Granulation Amount: None Present (0%) Exposed Structure Necrotic Amount: Large (67-100%) Fascia Exposed: No Fat Layer (Subcutaneous Tissue) Exposed: No Tendon Exposed: No Muscle Exposed: No Joint Exposed: No Bone Exposed: No Electronic Signature(s) Signed: 06/12/2021 4:12:25 PM By: Karl Ito Signed: 06/13/2021 6:18:32 PM By: Fonnie Mu RN Entered By: Karl Ito on 06/12/2021 15:50:29 -------------------------------------------------------------------------------- Wound Assessment Details Patient  Name: Date of Service: [name] 06/12/2021 10:45 A M Medical Record Number: [id number] Patient Account Number: [id number] Date of Birth/Sex: Treating RN: Aug 28, 1946 (75 y.o. [name] Primary Care Durga Saldarriaga: CENTER, V A Other Clinician: Referring Drina Jobst: Treating Duante Arocho/Extender: Leslie Andrea, [name]: 2 Wound Status Wound Number: 3 Primary Diabetic Wound/Ulcer of the Lower Extremity Etiology: Wound Location: Left T Fifth oe Wound Open Wounding Event: Gradually Appeared Status: Date Acquired: 04/20/2021 Comorbid Sleep Apnea, Coronary Artery Disease, Hypertension, Myocardial Weeks Of Treatment: 2 History: Infarction, Peripheral Arterial Disease, Type II Diabetes, Neuropathy Clustered Wound: No Photos Wound Measurements Length: (cm) 0.3 Width: (cm) 0.2 Depth: (cm) 0.1 Area: (cm) 0.047 Volume: (cm) 0.005 % Reduction in Area: 76% % Reduction in Volume: 75% Epithelialization: None Tunneling: No Undermining: No Wound Description Classification: Unable to visualize wound bed Wound Margin: Flat and Intact Exudate Amount: Medium Exudate Type: Serosanguineous Exudate Color: red, brown Foul Odor After Cleansing: No Slough/Fibrino Yes Wound Bed Granulation Amount: None Present (0%) Exposed Structure Necrotic Amount: Large (67-100%) Fascia Exposed: No Necrotic Quality: Eschar Fat Layer (Subcutaneous Tissue) Exposed: No Tendon Exposed: No Muscle Exposed: No Joint Exposed: No Bone Exposed: No Electronic Signature(s) Signed: 06/12/2021 4:12:25 PM By: Karl Ito Signed: 06/13/2021 6:18:32 PM By: Fonnie Mu RN Entered By: Karl Ito on 06/12/2021 15:50:11 -------------------------------------------------------------------------------- Vitals Details Patient Name: Date of Service: [name] 06/12/2021 10:45 A M Medical Record Number: [id number] Patient Account Number: [id number] Date of  Birth/Sex: Treating RN: 11/29/1946 (75 y.o. [name] Primary Care Shilpa Bushee: CENTER, V A Other Clinician: Referring Nazariah Cadet: Treating Ynez Eugenio/Extender: Leslie Andrea, [name]: 2 Vital Signs Time Taken: 11:33 Temperature (F): 97.7 Height (in): 68 Pulse (bpm): 91 Weight (lbs): 214 Respiratory Rate (breaths/min): 18 Body Mass Index (BMI): 32.5 Blood Pressure (mmHg): 108/74 Capillary Blood Glucose (mg/dl): 102 Reference Range: 80 - 120 mg / dl Electronic Signature(s) Signed: 06/13/2021 6:18:32 PM By: Fonnie Mu RN Entered By: Fonnie Mu on 06/12/2021 11:33:27

## 2021-07-09 ENCOUNTER — Other Ambulatory Visit: Payer: Self-pay

## 2021-07-09 ENCOUNTER — Emergency Department (HOSPITAL_COMMUNITY): Payer: No Typology Code available for payment source

## 2021-07-09 ENCOUNTER — Emergency Department (HOSPITAL_COMMUNITY)
Admission: EM | Admit: 2021-07-09 | Discharge: 2021-07-10 | Disposition: A | Discharge status: Home / Self Care | Payer: No Typology Code available for payment source | Source: Home / Self Care

## 2021-07-09 ENCOUNTER — Encounter (HOSPITAL_BASED_OUTPATIENT_CLINIC_OR_DEPARTMENT_OTHER): Payer: Medicare Other | Admitting: Physician Assistant

## 2021-07-09 DIAGNOSIS — Z5321 Procedure and treatment not carried out due to patient leaving prior to being seen by health care provider: Secondary | ICD-10-CM | POA: Insufficient documentation

## 2021-07-09 DIAGNOSIS — R0602 Shortness of breath: Secondary | ICD-10-CM | POA: Insufficient documentation

## 2021-07-09 DIAGNOSIS — L089 Local infection of the skin and subcutaneous tissue, unspecified: Secondary | ICD-10-CM | POA: Insufficient documentation

## 2021-07-09 DIAGNOSIS — M86172 Other acute osteomyelitis, left ankle and foot: Secondary | ICD-10-CM | POA: Diagnosis not present

## 2021-07-09 DIAGNOSIS — M62272 Nontraumatic ischemic infarction of muscle, left ankle and foot: Secondary | ICD-10-CM | POA: Diagnosis not present

## 2021-07-09 DIAGNOSIS — E1152 Type 2 diabetes mellitus with diabetic peripheral angiopathy with gangrene: Secondary | ICD-10-CM | POA: Diagnosis not present

## 2021-07-09 DIAGNOSIS — I739 Peripheral vascular disease, unspecified: Secondary | ICD-10-CM | POA: Diagnosis not present

## 2021-07-09 DIAGNOSIS — L03116 Cellulitis of left lower limb: Secondary | ICD-10-CM | POA: Diagnosis not present

## 2021-07-09 LAB — URINALYSIS, ROUTINE W REFLEX MICROSCOPIC
Bilirubin Urine: NEGATIVE
Glucose, UA: NEGATIVE mg/dL
Hgb urine dipstick: NEGATIVE
Ketones, ur: NEGATIVE mg/dL
Leukocytes,Ua: NEGATIVE
Nitrite: NEGATIVE
Protein, ur: NEGATIVE mg/dL
Specific Gravity, Urine: 1.02 (ref 1.005–1.030)
pH: 5 (ref 5.0–8.0)

## 2021-07-09 LAB — COMPREHENSIVE METABOLIC PANEL
ALT: 32 U/L (ref 0–44)
AST: 36 U/L (ref 15–41)
Albumin: 3.1 g/dL — ABNORMAL LOW (ref 3.5–5.0)
Alkaline Phosphatase: 85 U/L (ref 38–126)
Anion gap: 9 (ref 5–15)
BUN: 21 mg/dL (ref 8–23)
CO2: 27 mmol/L (ref 22–32)
Calcium: 9.5 mg/dL (ref 8.9–10.3)
Chloride: 102 mmol/L (ref 98–111)
Creatinine, Ser: 1.27 mg/dL — ABNORMAL HIGH (ref 0.61–1.24)
GFR, Estimated: 60 mL/min — ABNORMAL LOW (ref >60)
Glucose, Bld: 198 mg/dL — ABNORMAL HIGH (ref 70–99)
Potassium: 4.4 mmol/L (ref 3.5–5.1)
Sodium: 138 mmol/L (ref 135–145)
Total Bilirubin: 0.8 mg/dL (ref 0.3–1.2)
Total Protein: 7.3 g/dL (ref 6.5–8.1)

## 2021-07-09 LAB — CBC WITH DIFFERENTIAL/PLATELET
Abs Immature Granulocytes: 0.05 10*3/uL (ref 0.00–0.07)
Basophils Absolute: 0 10*3/uL (ref 0.0–0.1)
Basophils Relative: 0 %
Eosinophils Absolute: 0.1 10*3/uL (ref 0.0–0.5)
Eosinophils Relative: 1 %
HCT: 40.2 % (ref 39.0–52.0)
Hemoglobin: 12.8 g/dL — ABNORMAL LOW (ref 13.0–17.0)
Immature Granulocytes: 0 %
Lymphocytes Relative: 12 %
Lymphs Abs: 1.4 10*3/uL (ref 0.7–4.0)
MCH: 31.4 pg (ref 26.0–34.0)
MCHC: 31.8 g/dL (ref 30.0–36.0)
MCV: 98.5 fL (ref 80.0–100.0)
Monocytes Absolute: 1.1 10*3/uL — ABNORMAL HIGH (ref 0.1–1.0)
Monocytes Relative: 10 %
Neutro Abs: 8.6 10*3/uL — ABNORMAL HIGH (ref 1.7–7.7)
Neutrophils Relative %: 77 %
Platelets: 301 10*3/uL (ref 150–400)
RBC: 4.08 MIL/uL — ABNORMAL LOW (ref 4.22–5.81)
RDW: 13.2 % (ref 11.5–15.5)
WBC: 11.3 10*3/uL — ABNORMAL HIGH (ref 4.0–10.5)
nRBC: 0 % (ref 0.0–0.2)

## 2021-07-09 LAB — LACTIC ACID, PLASMA: Lactic Acid, Venous: 2.3 mmol/L (ref 0.5–1.9)

## 2021-07-09 MED ORDER — OXYCODONE-ACETAMINOPHEN 5-325 MG PO TABS
1.0000 | ORAL_TABLET | Freq: Once | ORAL | Status: AC
Start: 2021-07-09 — End: 2021-07-09
  Administered 2021-07-09: 1 via ORAL
  Filled 2021-07-09: qty 1

## 2021-07-09 NOTE — ED Provider Notes (Signed)
Emergency Medicine Provider Triage Evaluation Note  [name] , a 75 y.o. male  was evaluated in triage.  Pt complains of possible infection to the toes of the left foot.  Went in for a wound check and was sent to the ED due to concern for deep infection of the left toes.  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  Review of Systems  Positive: Increasing left toe pain, purulence Negative: Fever, other extremity pain  Physical Exam  BP (!) 124/43 (BP Location: Left Arm)   Pulse 79   Temp 98.9 F (37.2 C)   Resp 20   Ht 5\' 8"  (1.727 m)   Wt 100.8 kg   SpO2 97%   BMI 33.79 kg/m  Gen:   Awake, no distress   Resp:  Normal effort  MSK:   Moves extremities without difficulty  Other:  Left toes are swollen, erythematous, area of possible fluid collection to the plantar surface of the distal left foot.  Toes are tender.  Pulses intact.  Cap refill intact in the toes.  Medical Decision Making  Medically screening exam initiated at 7:43 PM.  Appropriate orders placed.  was informed that the remainder of the evaluation will be completed by another provider, this initial triage assessment does not replace that evaluation, and the importance of remaining in the ED until their evaluation is complete.     Karna Christmas 07/09/21 2011    2012, MD 07/09/21 [phone]

## 2021-07-09 NOTE — ED Notes (Signed)
Pt stated he was going home and would see his PCP because this wait is ridiculous

## 2021-07-09 NOTE — ED Triage Notes (Signed)
Pt was advised by [facility] to come to ED for wound infection to left toe.   Pt was supposed to have wound vac but facility states "his condition is way past it"  Also reports SOB. Denies any fever, chills, nausea and vomiting.

## 2021-07-10 ENCOUNTER — Inpatient Hospital Stay (HOSPITAL_COMMUNITY)
Admission: EM | Admit: 2021-07-10 | Discharge: 2021-07-17 | DRG: 240 | Disposition: A | Discharge status: Home Health | Payer: No Typology Code available for payment source | Attending: Internal Medicine | Admitting: Internal Medicine

## 2021-07-10 ENCOUNTER — Other Ambulatory Visit: Payer: Self-pay

## 2021-07-10 DIAGNOSIS — I10 Essential (primary) hypertension: Secondary | ICD-10-CM | POA: Diagnosis present

## 2021-07-10 DIAGNOSIS — E1152 Type 2 diabetes mellitus with diabetic peripheral angiopathy with gangrene: Principal | ICD-10-CM | POA: Diagnosis present

## 2021-07-10 DIAGNOSIS — Z794 Long term (current) use of insulin: Secondary | ICD-10-CM

## 2021-07-10 DIAGNOSIS — Z888 Allergy status to other drugs, medicaments and biological substances status: Secondary | ICD-10-CM

## 2021-07-10 DIAGNOSIS — Z6831 Body mass index (BMI) 31.0-31.9, adult: Secondary | ICD-10-CM

## 2021-07-10 DIAGNOSIS — Z7982 Long term (current) use of aspirin: Secondary | ICD-10-CM

## 2021-07-10 DIAGNOSIS — Z79899 Other long term (current) drug therapy: Secondary | ICD-10-CM

## 2021-07-10 DIAGNOSIS — Z8616 Personal history of COVID-19: Secondary | ICD-10-CM

## 2021-07-10 DIAGNOSIS — Z89432 Acquired absence of left foot: Secondary | ICD-10-CM

## 2021-07-10 DIAGNOSIS — E11621 Type 2 diabetes mellitus with foot ulcer: Secondary | ICD-10-CM | POA: Diagnosis present

## 2021-07-10 DIAGNOSIS — I739 Peripheral vascular disease, unspecified: Secondary | ICD-10-CM | POA: Diagnosis present

## 2021-07-10 DIAGNOSIS — L089 Local infection of the skin and subcutaneous tissue, unspecified: Secondary | ICD-10-CM

## 2021-07-10 DIAGNOSIS — Z811 Family history of alcohol abuse and dependence: Secondary | ICD-10-CM

## 2021-07-10 DIAGNOSIS — E669 Obesity, unspecified: Secondary | ICD-10-CM | POA: Diagnosis present

## 2021-07-10 DIAGNOSIS — L97529 Non-pressure chronic ulcer of other part of left foot with unspecified severity: Secondary | ICD-10-CM | POA: Diagnosis present

## 2021-07-10 DIAGNOSIS — I96 Gangrene, not elsewhere classified: Secondary | ICD-10-CM | POA: Diagnosis present

## 2021-07-10 DIAGNOSIS — Z833 Family history of diabetes mellitus: Secondary | ICD-10-CM

## 2021-07-10 DIAGNOSIS — M869 Osteomyelitis, unspecified: Secondary | ICD-10-CM | POA: Diagnosis present

## 2021-07-10 DIAGNOSIS — F4310 Post-traumatic stress disorder, unspecified: Secondary | ICD-10-CM | POA: Diagnosis present

## 2021-07-10 DIAGNOSIS — Z951 Presence of aortocoronary bypass graft: Secondary | ICD-10-CM

## 2021-07-10 DIAGNOSIS — Z87891 Personal history of nicotine dependence: Secondary | ICD-10-CM

## 2021-07-10 DIAGNOSIS — I2510 Atherosclerotic heart disease of native coronary artery without angina pectoris: Secondary | ICD-10-CM | POA: Diagnosis present

## 2021-07-10 DIAGNOSIS — Z7984 Long term (current) use of oral hypoglycemic drugs: Secondary | ICD-10-CM

## 2021-07-10 DIAGNOSIS — E1169 Type 2 diabetes mellitus with other specified complication: Secondary | ICD-10-CM | POA: Diagnosis present

## 2021-07-10 DIAGNOSIS — E785 Hyperlipidemia, unspecified: Secondary | ICD-10-CM | POA: Diagnosis present

## 2021-07-10 DIAGNOSIS — I252 Old myocardial infarction: Secondary | ICD-10-CM

## 2021-07-10 DIAGNOSIS — L03032 Cellulitis of left toe: Secondary | ICD-10-CM | POA: Diagnosis present

## 2021-07-10 DIAGNOSIS — F32A Depression, unspecified: Secondary | ICD-10-CM | POA: Diagnosis present

## 2021-07-10 DIAGNOSIS — Z8673 Personal history of transient ischemic attack (TIA), and cerebral infarction without residual deficits: Secondary | ICD-10-CM

## 2021-07-10 DIAGNOSIS — Z9861 Coronary angioplasty status: Secondary | ICD-10-CM

## 2021-07-10 DIAGNOSIS — E1151 Type 2 diabetes mellitus with diabetic peripheral angiopathy without gangrene: Secondary | ICD-10-CM | POA: Diagnosis present

## 2021-07-10 DIAGNOSIS — Z7902 Long term (current) use of antithrombotics/antiplatelets: Secondary | ICD-10-CM

## 2021-07-10 LAB — COMPREHENSIVE METABOLIC PANEL
ALT: 29 U/L (ref 0–44)
AST: 32 U/L (ref 15–41)
Albumin: 3.1 g/dL — ABNORMAL LOW (ref 3.5–5.0)
Alkaline Phosphatase: 85 U/L (ref 38–126)
Anion gap: 6 (ref 5–15)
BUN: 21 mg/dL (ref 8–23)
CO2: 28 mmol/L (ref 22–32)
Calcium: 9.5 mg/dL (ref 8.9–10.3)
Chloride: 99 mmol/L (ref 98–111)
Creatinine, Ser: 1.37 mg/dL — ABNORMAL HIGH (ref 0.61–1.24)
GFR, Estimated: 54 mL/min — ABNORMAL LOW (ref >60)
Glucose, Bld: 120 mg/dL — ABNORMAL HIGH (ref 70–99)
Potassium: 4.7 mmol/L (ref 3.5–5.1)
Sodium: 133 mmol/L — ABNORMAL LOW (ref 135–145)
Total Bilirubin: 1 mg/dL (ref 0.3–1.2)
Total Protein: 7 g/dL (ref 6.5–8.1)

## 2021-07-10 LAB — CBC WITH DIFFERENTIAL/PLATELET
Abs Immature Granulocytes: 0.05 10*3/uL (ref 0.00–0.07)
Basophils Absolute: 0 10*3/uL (ref 0.0–0.1)
Basophils Relative: 0 %
Eosinophils Absolute: 0.1 10*3/uL (ref 0.0–0.5)
Eosinophils Relative: 1 %
HCT: 39.4 % (ref 39.0–52.0)
Hemoglobin: 12.7 g/dL — ABNORMAL LOW (ref 13.0–17.0)
Immature Granulocytes: 0 %
Lymphocytes Relative: 13 %
Lymphs Abs: 1.8 10*3/uL (ref 0.7–4.0)
MCH: 31.1 pg (ref 26.0–34.0)
MCHC: 32.2 g/dL (ref 30.0–36.0)
MCV: 96.6 fL (ref 80.0–100.0)
Monocytes Absolute: 1.5 10*3/uL — ABNORMAL HIGH (ref 0.1–1.0)
Monocytes Relative: 12 %
Neutro Abs: 9.8 10*3/uL — ABNORMAL HIGH (ref 1.7–7.7)
Neutrophils Relative %: 74 %
Platelets: 294 10*3/uL (ref 150–400)
RBC: 4.08 MIL/uL — ABNORMAL LOW (ref 4.22–5.81)
RDW: 13.2 % (ref 11.5–15.5)
WBC: 13.3 10*3/uL — ABNORMAL HIGH (ref 4.0–10.5)
nRBC: 0 % (ref 0.0–0.2)

## 2021-07-10 LAB — LACTIC ACID, PLASMA: Lactic Acid, Venous: 1.3 mmol/L (ref 0.5–1.9)

## 2021-07-10 MED ORDER — OXYCODONE-ACETAMINOPHEN 5-325 MG PO TABS
1.0000 | ORAL_TABLET | ORAL | Status: DC | PRN
  Administered 2021-07-10: 1 via ORAL
  Filled 2021-07-10: qty 1

## 2021-07-10 NOTE — Progress Notes (Addendum)
[name] [name] ([id number]) Visit Report for 07/09/2021 Arrival Information Details Patient Name: Date of Service: [name] [name] 07/09/2021 12:45 PM Medical Record Number: [id number] Patient Account Number: [id number] Date of Birth/Sex: Treating RN: [birth year] (75 y.o. [name] [name] Primary Care [name] [name]: [facility] Other Clinician: Referring [name] [name]: Treating [name] [name]/Extender: [name] [name], [name]: 6 Visit Information History Since Last Visit Added or deleted any medications: No Patient Arrived: Ambulatory Any new allergies or adverse reactions: No Arrival Time: 12:58 Had a fall or experienced change in No Transfer Assistance: None activities of daily living that may affect Patient Identification Verified: Yes risk of falls: Secondary Verification Process Completed: Yes Signs or symptoms of abuse/neglect since last visito No Patient Requires Transmission-Based Precautions: No Hospitalized since last visit: No Patient Has Alerts: Yes Implantable device outside of the clinic excluding No Patient Alerts: L ABI: [name] TBI: 0.30 cellular tissue based products placed in the center R ABI: [name] [name] TBI: 0.32 since last visit: Has Dressing in Place as Prescribed: Yes Pain Present Now: Yes Electronic Signature(s) Signed: 07/09/2021 5:31:35 PM By: Antonieta Iba Entered By: Antonieta Iba on 07/09/2021 12:58:59 -------------------------------------------------------------------------------- Clinic Level of Care Assessment Details Patient Name: Date of Service: [name] [name] 07/09/2021 12:45 PM Medical Record Number: [id number] Patient Account Number: [id number] Date of Birth/Sex: Treating RN: 01/15/1946 (75 y.o. Damaris Schooner Primary Care Toy Samarin: [facility] Other Clinician: Referring Miki Labuda: Treating Jameia Makris/Extender: [name] [name], [name]: 6 Clinic Level of Care Assessment Items TOOL 4 Quantity Score []   - 0 Use when only an EandM is performed on FOLLOW-UP visit ASSESSMENTS - Nursing Assessment / Reassessment X- 1 10 Reassessment of Co-morbidities (includes updates in patient status) X- 1 5 Reassessment of Adherence to Treatment Plan ASSESSMENTS - Wound and Skin A ssessment / Reassessment []  - 0 Simple Wound Assessment / Reassessment - one wound X- 3 5 Complex Wound Assessment / Reassessment - multiple wounds []  - 0 Dermatologic / Skin Assessment (not related to wound area) ASSESSMENTS - Focused Assessment []  - 0 Circumferential Edema Measurements - multi extremities []  - 0 Nutritional Assessment / Counseling / Intervention X- 1 5 Lower Extremity Assessment (monofilament, tuning fork, pulses) []  - 0 Peripheral Arterial Disease Assessment (using hand held doppler) ASSESSMENTS - Ostomy and/or Continence Assessment and Care []  - 0 Incontinence Assessment and Management []  - 0 Ostomy Care Assessment and Management (repouching, etc.) PROCESS - Coordination of Care X - Simple Patient / Family Education for ongoing care 1 15 []  - 0 Complex (extensive) Patient / Family Education for ongoing care X- 1 10 Staff obtains , Records, T Results / Process Orders est []  - 0 Staff telephones HHA, Nursing Homes / Clarify orders / etc []  - 0 Routine Transfer to another Facility (non-emergent condition) X- 1 10 Routine Hospital Admission (non-emergent condition) []  - 0 New Admissions / / Ordering NPWT Apligraf, etc. , []  - 0 Emergency Hospital Admission (emergent condition) X- 1 10 Simple Discharge Coordination []  - 0 Complex (extensive) Discharge Coordination PROCESS - Special Needs []  - 0 Pediatric / Minor Patient Management []  - 0 Isolation Patient Management []  - 0 Hearing / Language / Visual special needs []  - 0 Assessment of Community assistance (transportation, D/C planning, etc.) []  - 0 Additional assistance / Altered mentation []  -  0 Support Surface(s) Assessment (bed, cushion, seat, etc.) INTERVENTIONS - Wound Cleansing / Measurement []  - 0 Simple Wound Cleansing -  one wound X- 3 5 Complex Wound Cleansing - multiple wounds X- 1 5 Wound Imaging (photographs - any number of wounds) []  - 0 Wound Tracing (instead of photographs) []  - 0 Simple Wound Measurement - one wound X- 3 5 Complex Wound Measurement - multiple wounds INTERVENTIONS - Wound Dressings X - Small Wound Dressing one or multiple wounds 1 10 []  - 0 Medium Wound Dressing one or multiple wounds []  - 0 Large Wound Dressing one or multiple wounds X- 1 5 Application of Medications - topical []  - 0 Application of Medications - injection INTERVENTIONS - Miscellaneous []  - 0 External ear exam []  - 0 Specimen Collection (cultures, biopsies, blood, body fluids, etc.) []  - 0 Specimen(s) / Culture(s) sent or taken to Lab for analysis []  - 0 Patient Transfer (multiple staff / / Similar devices) []  - 0 Simple Staple / Suture removal (25 or less) []  - 0 Complex Staple / Suture removal (26 or more) []  - 0 Hypo / Hyperglycemic Management (close monitor of Blood Glucose) []  - 0 Ankle / Brachial Index (ABI) - do not check if billed separately X- 1 5 Vital Signs Has the patient been seen at the hospital within the last three years: Yes Total Score: 135 Level Of Care: New/Established - Level 4 Electronic Signature(s) Signed: 07/09/2021 6:23:27 PM By: RN, BSN Entered By: on 07/09/2021 13:40:29 -------------------------------------------------------------------------------- Encounter Discharge Information Details Patient Name: Date of Service: 07/09/2021 12:45 PM Medical Record Number: Patient Account Number: Date of Birth/Sex: Treating RN: [birth year] (75 y.o. Primary Care Abdirizak Richison: [facility] Other Clinician: Referring Salam Chesterfield: Treating  Cydne Grahn/Extender: , [name]: 6 Encounter Discharge Information Items Discharge Condition: Stable Ambulatory Status: Ambulatory Discharge Destination: Emergency Room Telephoned: Yes Spoke With: PA spoke with ED staff Orders Sent: Yes Transportation: Private Auto Accompanied By: self Schedule Follow-up Appointment: No Clinical Summary of Care: Notes Patient sent with discharge paperwork and a progress note to provide to the ED MD when he arrives. Patient transporting self to ED. Electronic Signature(s) Signed: 07/09/2021 2:16:29 PM By: 07/11/2021 Entered By: Zenaida Deed on 07/09/2021 14:16:16 -------------------------------------------------------------------------------- Lower Extremity Assessment Details Patient Name: Date of Service: [name], [name] 07/09/2021 12:45 PM Medical Record Number: 07/11/2021 Patient Account Number: [id number] Date of Birth/Sex: Treating RN: September 13, 1946 (75 y.o. 66 Primary Care Takeria Marquina: [facility] Other Clinician: Referring Thierno Hun: Treating Matisyn Cabeza/Extender: Tammy Sours, [name]: 6 Edema Assessment Assessed: [Left: Yes] [Right: No] Edema: [Left: Ye] [Right: s] Calf Left: Right: Point of Measurement: 31 cm From Medial Instep 42 cm Ankle Left: Right: Point of Measurement: 10 cm From Medial Instep 24 cm Vascular Assessment Pulses: Dorsalis Pedis Palpable: [Left:Yes] Electronic Signature(s) Signed: 07/09/2021 5:31:35 PM By: 07/11/2021 Entered By: Shawn Stall on 07/09/2021 13:04:29 -------------------------------------------------------------------------------- Multi-Disciplinary Care Plan Details Patient Name: Date of Service: 07/11/2021 07/09/2021 12:45 PM Medical Record Number: 07/11/2021 Patient Account Number: [id number] Date of Birth/Sex: Treating RN: Apr 29, 1946 (75 y.o. 65 Primary Care Thijs Brunton: [facility] Other  Clinician: Referring Erinn Mendosa: Treating Jaala Bohle/Extender: [name] [name], [name]: 6 Multidisciplinary Care Plan reviewed with physician Active Inactive Electronic Signature(s) Signed: 09/18/2021 3:07:17 PM By: 07/11/2021 RN, BSN Previous Signature: 07/09/2021 6:23:27 PM Version By: Antonieta Iba RN, BSN Entered By: 07/11/2021 on 08/14/2021 13:03:40 -------------------------------------------------------------------------------- Pain Assessment Details Patient  Name: Date of Service: AVANISH, CERULLO 07/09/2021 12:45 PM Medical Record Number: [id number] Patient Account Number: [id number] Date of Birth/Sex: Treating RN: 1946-03-25 (75 y.o. [name] [name] Primary Care Bernardette Waldron: [facility] Other Clinician: Referring Annabeth Tortora: Treating Bing Duffey/Extender: [name] [name], [name]: 6 Active Problems Location of Pain Severity and Description of Pain Patient Has Paino Yes Site Locations Pain Location: Pain Location: Pain in Ulcers With Dressing Change: Yes Duration of the Pain. Constant / Intermittento Intermittent Rate the pain. Current Pain Level: 9 Character of Pain Describe the Pain: Stabbing, Throbbing Pain Management and Medication Current Pain Management: Medication: Yes Cold Application: No Rest: Yes Massage: No Activity: No T.E.N.S.: No Heat Application: No Leg drop or elevation: No Is the Current Pain Management Adequate: Inadequate How does your wound impact your activities of daily livingo Sleep: Yes Bathing: No Appetite: No Relationship With Others: No Bladder Continence: No Emotions: No Bowel Continence: No Work: No Toileting: No Drive: No Dressing: No Hobbies: No Electronic Signature(s) Signed: 07/09/2021 5:31:35 PM By: Antonieta Iba Entered By: Antonieta Iba on 07/09/2021 12:59:29 -------------------------------------------------------------------------------- Patient/Caregiver  Education Details Patient Name: Date of Service: [name] Porch 7/20/2022andnbsp12:45 PM Medical Record Number: [id number] Patient Account Number: [id number] Date of Birth/Gender: Treating RN: Feb 28, 1946 (75 y.o. Damaris Schooner Primary Care Physician: [facility] Other Clinician: Referring Physician: Treating Physician/Extender: [name] [name], [name]: 6 Education Assessment Education Provided To: Patient Education Topics Provided Infection: Methods: Explain/Verbal Responses: Reinforcements needed, State content correctly Wound/Skin Impairment: Methods: Explain/Verbal Responses: Reinforcements needed, State content correctly Electronic Signature(s) Signed: 07/09/2021 6:23:27 PM By: Zenaida Deed RN, BSN Entered By: Zenaida Deed on 07/09/2021 13:39:12 -------------------------------------------------------------------------------- Wound Assessment Details Patient Name: Date of Service: [name] Porch 07/09/2021 12:45 PM Medical Record Number: [id number] Patient Account Number: [id number] Date of Birth/Sex: Treating RN: 10-14-1946 (75 y.o. 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 Wounding Event: Surgical Injury Etiology: Date Acquired: 03/18/2021 Wound Open Weeks Of Treatment: 6 Status: Clustered Wound: No Comorbid Sleep Apnea, Coronary Artery Disease, Hypertension, Myocardial History: Infarction, Peripheral Arterial Disease, Type II Diabetes, Neuropathy Photos Photo Uploaded By: Haywood Pao on 07/10/2021 13:55:08 Wound Measurements Length: (cm) 1 Width: (cm) 0.7 Depth: (cm) 1.7 Area: (cm) 0.55 Volume: (cm) 0.935 %  Reduction in Area: 53.3% % Reduction in Volume: 20.6% Epithelialization: Small (1-33%) Undermining: Yes Starting Position (o'clock): 6 Ending Position (o'clock): 12 Maximum Distance: (cm) 1.9 Wound Description Classification: Full Thickness Without Exposed Support Structures Wound Margin: Distinct, outline attached Exudate Amount: Medium Exudate Type: Serosanguineous Exudate Color: red, brown Foul Odor After Cleansing: Yes Due to Product Use: No Slough/Fibrino Yes Wound Bed Granulation Amount: Medium (34-66%) Exposed Structure Granulation Quality: Pink Fascia Exposed: No Necrotic Amount: Medium (34-66%) Fat Layer (Subcutaneous Tissue) Exposed: Yes Necrotic Quality: Adherent Slough Tendon Exposed: No Muscle Exposed: No Joint Exposed: No Bone Exposed: No Assessment Notes Periwound and foot erythema Electronic Signature(s) Signed: 07/09/2021 5:31:35 PM By: Antonieta Iba Entered By: Antonieta Iba on 07/09/2021 13:04:04 -------------------------------------------------------------------------------- Wound Assessment Details Patient Name: Date of Service: [name] Porch 07/09/2021 12:45 PM Medical Record Number: [id number] Patient Account Number: [id number] Date of Birth/Sex: Treating RN: 13-Feb-1946 (75 y.o. [name] [name] Primary Care Jeanita Carneiro:  [facility] Other Clinician: Referring Latresa Gasser: Treating Skylynn Burkley/Extender: [name] BucksStone III, [place] [name]: 6 Wound Status Wound Number: 2 Primary Diabetic Wound/Ulcer of the Lower Extremity Etiology: Wound Location: Left, Dorsal T Second oe Wound Open Wounding Event: Gradually Appeared Status: Date Acquired: 04/20/2021 Comorbid Sleep Apnea, Coronary Artery Disease, Hypertension, Myocardial Weeks Of Treatment: 6 History: Infarction, Peripheral Arterial Disease, Type II Diabetes, Clustered Wound: No Neuropathy Photos Photo Uploaded By: Haywood PaoScammell, Michael on 07/10/2021 13:55:10 Wound  Measurements Length: (cm) 0.5 Width: (cm) 0.5 Depth: (cm) 0.1 Area: (cm) 0.196 Volume: (cm) 0.02 % Reduction in Area: 40.6% % Reduction in Volume: 39.4% Epithelialization: None Tunneling: No Undermining: No Wound Description Classification: Grade 2 Wound Margin: Distinct, outline attached Exudate Amount: Medium Exudate Type: Serosanguineous Exudate Color: red, brown Foul Odor After Cleansing: No Slough/Fibrino Yes Wound Bed Granulation Amount: None Present (0%) Exposed Structure Necrotic Amount: Large (67-100%) Fascia Exposed: No Necrotic Quality: Adherent Slough Fat Layer (Subcutaneous Tissue) Exposed: No Tendon Exposed: No Muscle Exposed: No Joint Exposed: No Bone Exposed: No Electronic Signature(s) Signed: 07/09/2021 5:31:35 PM By: Antonieta IbaBarnhart, Jodi Entered By: Antonieta IbaBarnhart, Jodi on 07/09/2021 13:03:01 -------------------------------------------------------------------------------- Wound Assessment Details Patient Name: Date of Service: [name] [name] [name] M B. 07/09/2021 12:45 PM Medical Record Number: [id number] Patient Account Number: [id number] Date of Birth/Sex: Treating RN: 05-18-1946 (75 y.o. [name] MichaelsM) Barnhart, Jodi Primary Care Jaja Switalski: [facility] Other Clinician: Referring Dior Dominik: Treating Sabra Sessler/Extender: [name] BucksStone III, [place] [name]: 6 Wound Status Wound Number: 3 Primary Diabetic Wound/Ulcer of the Lower Extremity Etiology: Wound Location: Left T Fifth oe Wound Open Wounding Event: Gradually Appeared Status: Date Acquired: 04/20/2021 Comorbid Sleep Apnea, Coronary Artery Disease, Hypertension, Myocardial Weeks Of Treatment: 6 History: Infarction, Peripheral Arterial Disease, Type II Diabetes, Clustered Wound: No Neuropathy Photos Photo Uploaded By: Haywood PaoScammell, Michael on 07/10/2021 13:52:51 Wound Measurements Length: (cm) 0.3 Width: (cm) 0.4 Depth: (cm) 0.1 Area: (cm) 0.094 Volume: (cm) 0.009 % Reduction in Area: 52% % Reduction  in Volume: 55% Epithelialization: Small (1-33%) Tunneling: No Undermining: No Wound Description Classification: Grade 2 Wound Margin: Flat and Intact Exudate Amount: Medium Exudate Type: Serosanguineous Exudate Color: red, brown Foul Odor After Cleansing: No Slough/Fibrino Yes Wound Bed Granulation Amount: Small (1-33%) Exposed Structure Granulation Quality: Pink Fascia Exposed: No Necrotic Amount: Large (67-100%) Fat Layer (Subcutaneous Tissue) Exposed: Yes Necrotic Quality: Eschar Tendon Exposed: No Muscle Exposed: No Joint Exposed: No Bone Exposed: No Electronic Signature(s) Signed: 07/09/2021 5:31:35 PM By: Antonieta IbaBarnhart, Jodi Entered By: Antonieta IbaBarnhart, Jodi on 07/09/2021 13:03:39 -------------------------------------------------------------------------------- Vitals Details Patient Name: Date of Service: [name] PorchUSSELL, [name] M B. 07/09/2021 12:45 PM Medical Record Number: [id number] Patient Account Number: [id number] Date of Birth/Sex: Treating RN: 05-18-1946 [age] y.o. [name] MichaelsM) Barnhart, Jodi Primary Care Elexius Minar: [facility] A Other Clinician: Referring Foster Frericks: Treating Cristy Colmenares/Extender: [name] BucksStone III, [place] [name]: 6 Vital Signs Time Taken: 13:01 Temperature (F): 98.7 Height (in): 68 Pulse (bpm): 81 Weight (lbs): 214 Respiratory Rate (breaths/min): 18 Body Mass Index (BMI): 32.5 Blood Pressure (mmHg): 122/91 Capillary Blood Glucose (mg/dl): 74 Reference Range: 80 - 120 mg / dl Electronic Signature(s) Signed: 07/09/2021 5:31:35 PM By: Antonieta IbaBarnhart, Jodi Entered By: Antonieta IbaBarnhart, Jodi on 07/09/2021 13:06:23

## 2021-07-10 NOTE — ED Provider Notes (Signed)
Emergency Medicine Provider Triage Evaluation Note  [name] , a 75 y.o. male  was evaluated in triage.  Pt complains of left toe wound.  Patient was sent to emergency department for concern for worsening infection to left toes.  Patient states that he had left fourth toe removed in March, since then he has had worsening purulent discharge, worsening pain, and worsening erythema.    Patient denies any fevers, chills, nausea, vomiting.  Review of Systems  Positive: Color change, myalgia, purulent discharge Negative: Fever, chills, nausea, vomiting  Physical Exam  BP (!) 127/43   Pulse 83   Temp 99.1 F (37.3 C) (Oral)   Resp 16   Ht 5\' 8"  (1.727 m)   Wt 94.9 kg   SpO2 100%   BMI 31.81 kg/m  Gen:   Awake, no distress   Resp:  Normal effort  MSK:   Moves extremities without difficulty  Other:  All toes of left foot are swollen, erythematous.  Erythema spreading of dorsum left foot.  Tenderness to palpation.  Purulent discharge noted from wound, fourth toe amputation.  Medical Decision Making  Medically screening exam initiated at 6:32 PM.  Appropriate orders placed.  was informed that the remainder of the evaluation will be completed by another provider, this initial triage assessment does not replace that evaluation, and the importance of remaining in the ED until their evaluation is complete.  Patient was emeses yesterday, left before being seen.  Lactic acid elevated 2.3, x-ray concerning for osteomyelitis.  Cultures were obtained.  The patient appears stable so that the remainder of the work up may be completed by another provider.      Karna Christmas 07/10/21 07/12/21, MD 07/14/21 1102

## 2021-07-10 NOTE — Progress Notes (Signed)
[name] ChristmasRUSSELL, [name] ([id number]) Visit Report for 07/09/2021 Chief Complaint Document Details Patient Name: Date of Service: [name], [name] M B. 07/09/2021 12:45 PM Medical Record Number: [id number] Patient Account Number: [id number] Date of Birth/Sex: Treating RN: June 21, 1946 (75 y.o. [name] SchoonerM) [name], [name] Primary Care Provider: [facility] Other Clinician: Referring Provider: Treating Provider/Extender: Nelda BucksStone [name], [name] Broad [name]: 6 Information Obtained from: Patient Chief Complaint Left foot ulcers Electronic Signature(s) Signed: 07/09/2021 1:54:53 PM By: Lenda KelpStone [name], [name] Krack PA-C Entered By: Lenda KelpStone [name], Finis Hendricksen on 07/09/2021 13:54:53 -------------------------------------------------------------------------------- HPI Details Patient Name: Date of Service: [name], [name] M B. 07/09/2021 12:45 PM Medical Record Number: [id number] Patient Account Number: [id number] Date of Birth/Sex: Treating RN: June 21, 1946 (75 y.o. [name] SchoonerM) [name], [name] Primary Care Provider: [facility] Other Clinician: Referring Provider: Treating Provider/Extender: Nelda BucksStone [name], Claudeen Leason [name]: 6 History of Present Illness HPI Description: 05/28/2021 upon evaluation today patient appears to be doing somewhat poorly in regard to wounds on his foot. He has really 3 main wounds noted. This is the surgical site which is the amputation of the fourth toe of the left foot. He also has a wound on the dorsal second toe of the left foot and a wound on the dorsal fifth toe of the left foot. 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 With that being said he did seem to have enough blood flow to support a light debridement which I think  will benefit him as far as trying to get the areas to heal more effectively. 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 Subsequently due to necrotic tissue and otherwise he had to be taken back to surgery on May 13 for surgical debridement. Unfortunately he states he is just really not improving the way that he would like to see. He does have a little bit of erythema and though there could be some cellulitis I may put him on an antibiotic for this. It sounds as if there is really not much from a vascular standpoint that can be done at this point for the patient. Nonetheless I think that from a purely wound care standpoint we may be able to get these wounds healed but I think it would definitely take a bit more time than it were not for a patient with more significant and appropriate blood flow. 6/23; patient was admitted 2 weeks ago by Leonard SchwartzHoyt in our clinic. He was started on Santyl and silver alginate and reports doing the dressing changes. He reports finishing Bactrim. He also completed his course of Bactrim. He is reporting continued pain to his foot and states that there is more redness to his toes. He does not recall having imaging done recently of his foot. 06/22/2021 upon evaluation today patient appears to be doing about the same in regard to his wounds. I am seeing some of the slough from the base of the wound the main wound between his fifth and third toes where he had the amputation. With that being said this is loosening up which is great news. Fortunately there does not appear to be any signs of active infection at this time which  is good although he has been placed back on antibiotics by vascular he walked into see them yesterday. They also gave him a refill the pain medication. With that being said looking at the wound currently is starting to clean up I am seeing some granulation in the base my hope is with the fact that it was documented  yesterday that he had brisk pulses in the DP and PT that he can heal this wound. Is probably just can take some time. With that being said I do believe based on what I see currently that the ideal thing may be for [place] to see about initiating a wound VAC. 07/09/2021 upon evaluation today patient appears to be doing poorly in regard to his foot to be honest. Fortunately there does not appear to be any signs of systemic infection that is obvious though locally I feel like the infection has worsened in fact I think this is developing into gangrene based on what I am seeing. He does have decreased capillary refill of the third toe and this is cyanotic appearing as well. In general he is having increased pain and he is extremely frustrated which I completely understand. There does not appear to be any signs of again sepsis but nonetheless the patient does have obvious signs of a significant local infection. Electronic Signature(s) Signed: 07/09/2021 1:55:27 PM By: Lenda Kelp PA-C Entered By: Lenda Kelp on 07/09/2021 13:55:26 -------------------------------------------------------------------------------- Physical Exam Details Patient Name: Date of Service: [name], [name] 07/09/2021 12:45 PM Medical Record Number: [id number] Patient Account Number: [id number] Date of Birth/Sex: Treating RN: August 05, 1946 (75 y.o. [name] Primary Care Provider: [facility] Other Clinician: Referring Provider: Treating Provider/Extender: Nelda Bucks, [name]: 6 Constitutional Well-nourished and well-hydrated in no acute distress. Respiratory normal breathing without difficulty. Psychiatric this patient is able to make decisions and demonstrates good insight into disease process. Alert and Oriented x 3. pleasant and cooperative. Notes Patient's wound bed is significant slough covered as far as the amputation site is concerned and this does not appear to be doing much  better to be honest. He does seem to be developing into gangrene as far as the distal portion of the foot is concerned especially in and around the plantar aspect of the third toe the third toe itself is also becoming cyanotic. Overall I am displeased with how the appearance of this is currently. I think he probably needs to go to the ER for further evaluation and treatment. Electronic Signature(s) Signed: 07/09/2021 1:56:01 PM By: Lenda Kelp PA-C Entered By: Lenda Kelp on 07/09/2021 13:56:01 -------------------------------------------------------------------------------- Physician Orders Details Patient Name: Date of Service: [name], [name] 07/09/2021 12:45 PM Medical Record Number: [id number] Patient Account Number: [id number] Date of Birth/Sex: Treating RN: 18-Mar-1946 (75 y.o. 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 - with  dressing changes Edema Control - Lymphedema / SCD / Other Elevate legs to the level of the heart or above for 30 minutes daily and/or when sitting, a frequency of: Avoid standing for long periods of time. Off-Loading Wedge shoe to: - front offloading shoe to left foot Additional Orders / Instructions Follow Nutritious Diet Home Health New wound care orders this week; continue Home Health for wound care. May utilize formulary equivalent dressing for wound treatment orders unless otherwise specified. - Ordering wound vac- hold VAC until after hospital discharge Other Home Health Orders/Instructions: - [facility] Wound Treatment Wound #1 - Amputation Site - Toe Wound Laterality: Left Prim Dressing: KerraCel Ag Gelling Fiber Dressing, 2x2 in (silver alginate) (Dispense As Written) 1 x Per Day/15 Days ary Discharge Instructions: Apply silver alginate to wound bed pack lightly into empty space Secondary Dressing: Woven Gauze Sponges 2x2 in (Generic) 1 x Per Day/15 Days Discharge Instructions: Apply over primary dressing as directed. Secured With: Insurance underwriter, Sterile 2x75 (in/in) (Generic) 1 x Per Day/15 Days Discharge Instructions: Secure with stretch gauze as directed. Wound #2 - T Second oe Wound Laterality: Dorsal, Left Secondary Dressing: Woven Gauze Sponges 2x2 in (Generic) 1 x Per Day/30 Days Discharge Instructions: Apply over primary dressing as directed. Secured With: Insurance underwriter, Sterile 2x75 (in/in) 1 x Per Day/30 Days Discharge Instructions: Secure with stretch gauze as directed. Wound #3 - T Fifth oe Wound Laterality: Left Secondary Dressing: Woven Gauze Sponges 2x2 in (Generic) 1 x Per Day/30 Days Discharge Instructions: Apply over primary dressing as directed. Secured With: Insurance underwriter, Sterile 2x75 (in/in) 1 x Per Day/30 Days Discharge Instructions: Secure with stretch gauze as directed. Electronic  Signature(s) Signed: 07/09/2021 5:20:22 PM By: Lenda Kelp PA-C Signed: 07/09/2021 6:23:27 PM By: Zenaida Deed RN, BSN Entered By: Zenaida Deed on 07/09/2021 13:47:42 -------------------------------------------------------------------------------- Problem List Details Patient Name: Date of Service: [name], [name] 07/09/2021 12:45 PM Medical Record Number: [id number] Patient Account Number: [id number] Date of Birth/Sex: Treating RN: 08/16/1946 (75 y.o. 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 of other part of unspecified foot with fat layer 05/28/2021 No Yes exposed L97.528 Non-pressure chronic ulcer of other part of left foot with other specified 05/28/2021 No Yes severity I73.89 Other specified peripheral vascular diseases 05/28/2021 No Yes 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) 05/28/2021 No Yes I10 Essential (primary) hypertension 05/28/2021 No Yes I25.10 Atherosclerotic heart disease of native coronary artery without angina pectoris 05/28/2021 No Yes G47.30 Sleep apnea, unspecified 05/28/2021 No Yes Inactive Problems Resolved Problems Electronic Signature(s) Signed: 07/09/2021 1:54:39 PM By: Lenda Kelp PA-C Entered By: Lenda Kelp on 07/09/2021 13:54:38 -------------------------------------------------------------------------------- Progress Note Details Patient Name: Date of Service: [name], [name] 07/09/2021 12:45 PM Medical Record Number: [id number] Patient Account Number: [id number] Date of Birth/Sex: Treating RN: Mar 21, 1946 (75  y.o. [name] Primary Care Provider:  [facility] Other Clinician: Referring Provider: Treating Provider/Extender: Nelda Bucks, [name]: 6 Subjective Chief Complaint Information obtained from Patient Left foot ulcers History of Present Illness (HPI) 05/28/2021 upon evaluation today patient appears to be doing somewhat poorly in regard to wounds on his foot. He has really 3 main wounds noted. This is the surgical site which is the amputation of the fourth toe of the left foot. He also has a wound on the dorsal second toe of the left foot and a wound on the dorsal fifth toe of the left foot. 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 With that being said he did seem to have enough blood flow to support a light debridement which I think will benefit him as far as trying to get the areas to heal more effectively. 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 Subsequently due to necrotic tissue and otherwise he had to be taken back to surgery on May 13 for surgical debridement. Unfortunately he states he is just really not improving the way that he would like to see. He does have a little bit of erythema and though there could be some cellulitis I may put him on an antibiotic for this. It sounds as if there is really not much from a vascular standpoint that can be done at this point for the patient. Nonetheless I think that from a purely wound care standpoint we may be able to get these wounds healed but I think it would definitely take a bit more time than it were not for a patient with more significant and appropriate blood flow. 6/23; patient was  admitted 2 weeks ago by Leonard Schwartz in our clinic. He was started on Santyl and silver alginate and reports doing the dressing changes. He reports finishing Bactrim. He also completed his course of Bactrim. He is reporting continued pain to his foot and states that there is more redness to his toes. He does not recall having imaging done recently of his foot. 06/22/2021 upon evaluation today patient appears to be doing about the same in regard to his wounds. I am seeing some of the slough from the base of the wound the main wound between his fifth and third toes where he had the amputation. With that being said this is loosening up which is great news. Fortunately there does not appear to be any signs of active infection at this time which is good although he has been placed back on antibiotics by vascular he walked into see them yesterday. They also gave him a refill the pain medication. With that being said looking at the wound currently is starting to clean up I am seeing some granulation in the base my hope is with the fact that it was documented yesterday that he had brisk pulses in the DP and PT that he can heal this wound. Is probably just can take some time. With that being said I do believe based on what I see currently that the ideal thing may be for [place] to see about initiating a wound VAC. 07/09/2021 upon evaluation today patient appears to be doing poorly  in regard to his foot to be honest. Fortunately there does not appear to be any signs of systemic infection that is obvious though locally I feel like the infection has worsened in fact I think this is developing into gangrene based on what I am seeing. He does have decreased capillary refill of the third toe and this is cyanotic appearing as well. In general he is having increased pain and he is extremely frustrated which I completely understand. There does not appear to be any signs of again sepsis but nonetheless the patient does have  obvious signs of a significant local infection. Objective Constitutional Well-nourished and well-hydrated in no acute distress. Vitals Time Taken: 1:01 PM, Height: 68 in, Weight: 214 lbs, BMI: 32.5, Temperature: 98.7 F, Pulse: 81 bpm, Respiratory Rate: 18 breaths/min, Blood Pressure: 122/91 mmHg, Capillary Blood Glucose: 74 mg/dl. Respiratory normal breathing without difficulty. Psychiatric this patient is able to make decisions and demonstrates good insight into disease process. Alert and Oriented x 3. pleasant and cooperative. General Notes: Patient's wound bed is significant slough covered as far as the amputation site is concerned and this does not appear to be doing much better to be honest. He does seem to be developing into gangrene as far as the distal portion of the foot is concerned especially in and around the plantar aspect of the third toe the third toe itself is also becoming cyanotic. Overall I am displeased with how the appearance of this is currently. I think he probably needs to go to the ER for further evaluation and treatment. Integumentary (Hair, Skin) Wound #1 status is Open. Original cause of wound was Surgical Injury. The date acquired was: 03/18/2021. The wound has been in treatment 6 weeks. The wound is located on the Left Amputation Site - T The wound measures 1cm length x 0.7cm width x 1.7cm depth; 0.55cm^2 area and 0.935cm^3 volume. oe. There is Fat Layer (Subcutaneous Tissue) exposed. There is undermining starting at 6:00 and ending at 12:00 with a maximum distance of 1.9cm. There is a medium amount of serosanguineous drainage noted. Foul odor after cleansing was noted. The wound margin is distinct with the outline attached to the wound base. There is medium (34-66%) pink granulation within the wound bed. There is a medium (34-66%) amount of necrotic tissue within the wound bed including Adherent Slough. General Notes: Periwound and foot erythema Wound #2 status  is Open. Original cause of wound was Gradually Appeared. The date acquired was: 04/20/2021. The wound has been in treatment 6 weeks. The wound is located on the Left,Dorsal T Second. The wound measures 0.5cm length x 0.5cm width x 0.1cm depth; 0.196cm^2 area and 0.02cm^3 volume. There oe is no tunneling or undermining noted. There is a medium amount of serosanguineous drainage noted. The wound margin is distinct with the outline attached to the wound base. There is no granulation within the wound bed. There is a large (67-100%) amount of necrotic tissue within the wound bed including Adherent Slough. Wound #3 status is Open. Original cause of wound was Gradually Appeared. The date acquired was: 04/20/2021. The wound has been in treatment 6 weeks. The wound is located on the Left T Fifth. The wound measures 0.3cm length x 0.4cm width x 0.1cm depth; 0.094cm^2 area and 0.009cm^3 volume. There is Fat oe Layer (Subcutaneous Tissue) exposed. There is no tunneling or undermining noted. There is a medium amount of serosanguineous drainage noted. The wound margin is flat and intact. There is small (1-33%) pink granulation  within the wound bed. There is a large (67-100%) amount of necrotic tissue within the wound bed including Eschar. 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 - with dressing changes Edema Control - Lymphedema / SCD / Other: Elevate legs to the level of the heart or above for 30 minutes daily and/or when sitting, a frequency of: Avoid standing for long periods of time. Off-Loading: Wedge shoe to: - front offloading shoe to left foot Additional Orders / Instructions: Follow Nutritious Diet Home Health: New wound care orders this week; continue Home Health for wound care. May utilize formulary equivalent dressing for wound treatment orders unless otherwise specified. - Ordering wound vac- hold VAC until after hospital discharge Other Home Health Orders/Instructions: - [facility] WOUND #1: - Amputation Site - T oe Wound Laterality: Left Prim Dressing: KerraCel Ag Gelling Fiber Dressing, 2x2 in (silver alginate) (Dispense As Written) 1 x Per Day/15 Days ary Discharge Instructions: Apply silver alginate to wound bed pack lightly into empty space Secondary Dressing: Woven Gauze Sponges 2x2 in (Generic) 1 x Per Day/15 Days Discharge Instructions: Apply over primary dressing as directed. Secured With: Insurance underwriter, Sterile 2x75 (in/in) (Generic) 1 x Per Day/15 Days Discharge Instructions: Secure with stretch gauze as directed. WOUND #2: - T Second Wound Laterality: Dorsal, Left oe Secondary Dressing: Woven Gauze Sponges 2x2 in (Generic) 1 x Per Day/30 Days Discharge Instructions: Apply over primary dressing as directed. Secured With: Insurance underwriter, Sterile 2x75 (in/in) 1 x Per Day/30 Days Discharge Instructions: Secure with stretch gauze as directed. WOUND #3: - T Fifth Wound Laterality: Left oe Secondary Dressing: Woven Gauze Sponges 2x2 in (Generic) 1 x Per Day/30 Days Discharge Instructions: Apply over primary dressing as directed. Secured With:  Insurance underwriter, Sterile 2x75 (in/in) 1 x Per Day/30 Days Discharge Instructions: Secure with stretch gauze as directed. 1. I am going to recommend currently that we go ahead and continue with the recommendation to go to the ER for further evaluation and treatment. Actually contacted the RN last name Chestine Spore at the emergency department at [facility] and he is expecting the patient within the hour. 2. Also contacted Dr. Carola Rhine office. I however got the voicemail after talking to [facility] who then referred me to the nurse line. Nonetheless I was not able to talk to anyone to let them know what was going on but I did advise Clark at the ER that the patient would need to be a vascular consult as he is a current patient of vein and vascular specialist in [place]. 3. Again I do believe this patient is going to require IV antibiotic therapy sooner rather than later and again it sounds like that he is headed towards a transmetatarsal amputation in my opinion I think this may be necessary to prevent this from worsening or spreading at this point.  We will see the patient back for follow-up visit as needed. Electronic Signature(s) Signed: 07/09/2021 1:57:10 PM By: Lenda Kelp PA-C Entered By: Lenda Kelp on 07/09/2021 13:57:10 -------------------------------------------------------------------------------- SuperBill Details Patient Name: Date of Service: [name], [name] 07/09/2021 Medical Record Number: [id number] Patient Account Number: [id number] Date of Birth/Sex: Treating RN: [birth year] (75 y.o. 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621 Type 2 diabetes mellitus with foot  ulcer L97.502 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

## 2021-07-10 NOTE — ED Triage Notes (Signed)
Pt here POV d/t left foot pain. Pt had amputation in March 2022. Pt here yesterday and eloped d/t wait time. Pt was told by primary vascular to seek treatment at ED for antibiotics and possible further amputation.  10/10 pain

## 2021-07-11 ENCOUNTER — Emergency Department (HOSPITAL_COMMUNITY): Payer: No Typology Code available for payment source

## 2021-07-11 DIAGNOSIS — E1151 Type 2 diabetes mellitus with diabetic peripheral angiopathy without gangrene: Secondary | ICD-10-CM | POA: Diagnosis not present

## 2021-07-11 DIAGNOSIS — Z833 Family history of diabetes mellitus: Secondary | ICD-10-CM | POA: Diagnosis not present

## 2021-07-11 DIAGNOSIS — F32A Depression, unspecified: Secondary | ICD-10-CM | POA: Diagnosis present

## 2021-07-11 DIAGNOSIS — L03032 Cellulitis of left toe: Secondary | ICD-10-CM | POA: Diagnosis present

## 2021-07-11 DIAGNOSIS — L089 Local infection of the skin and subcutaneous tissue, unspecified: Secondary | ICD-10-CM | POA: Insufficient documentation

## 2021-07-11 DIAGNOSIS — I70222 Atherosclerosis of native arteries of extremities with rest pain, left leg: Secondary | ICD-10-CM | POA: Diagnosis not present

## 2021-07-11 DIAGNOSIS — Z8616 Personal history of COVID-19: Secondary | ICD-10-CM | POA: Diagnosis not present

## 2021-07-11 DIAGNOSIS — Z79899 Other long term (current) drug therapy: Secondary | ICD-10-CM | POA: Diagnosis not present

## 2021-07-11 DIAGNOSIS — M86172 Other acute osteomyelitis, left ankle and foot: Secondary | ICD-10-CM

## 2021-07-11 DIAGNOSIS — Z811 Family history of alcohol abuse and dependence: Secondary | ICD-10-CM | POA: Diagnosis not present

## 2021-07-11 DIAGNOSIS — E11621 Type 2 diabetes mellitus with foot ulcer: Secondary | ICD-10-CM | POA: Diagnosis present

## 2021-07-11 DIAGNOSIS — Z7984 Long term (current) use of oral hypoglycemic drugs: Secondary | ICD-10-CM | POA: Diagnosis not present

## 2021-07-11 DIAGNOSIS — Z89422 Acquired absence of other left toe(s): Secondary | ICD-10-CM

## 2021-07-11 DIAGNOSIS — I252 Old myocardial infarction: Secondary | ICD-10-CM | POA: Diagnosis not present

## 2021-07-11 DIAGNOSIS — L97529 Non-pressure chronic ulcer of other part of left foot with unspecified severity: Secondary | ICD-10-CM | POA: Diagnosis present

## 2021-07-11 DIAGNOSIS — E1152 Type 2 diabetes mellitus with diabetic peripheral angiopathy with gangrene: Secondary | ICD-10-CM | POA: Diagnosis present

## 2021-07-11 DIAGNOSIS — Z6831 Body mass index (BMI) 31.0-31.9, adult: Secondary | ICD-10-CM | POA: Diagnosis not present

## 2021-07-11 DIAGNOSIS — Z89432 Acquired absence of left foot: Secondary | ICD-10-CM | POA: Diagnosis not present

## 2021-07-11 DIAGNOSIS — Z7982 Long term (current) use of aspirin: Secondary | ICD-10-CM | POA: Diagnosis not present

## 2021-07-11 DIAGNOSIS — E785 Hyperlipidemia, unspecified: Secondary | ICD-10-CM | POA: Diagnosis present

## 2021-07-11 DIAGNOSIS — I739 Peripheral vascular disease, unspecified: Secondary | ICD-10-CM

## 2021-07-11 DIAGNOSIS — M869 Osteomyelitis, unspecified: Secondary | ICD-10-CM | POA: Diagnosis present

## 2021-07-11 DIAGNOSIS — E1169 Type 2 diabetes mellitus with other specified complication: Secondary | ICD-10-CM | POA: Diagnosis present

## 2021-07-11 DIAGNOSIS — I2510 Atherosclerotic heart disease of native coronary artery without angina pectoris: Secondary | ICD-10-CM | POA: Diagnosis present

## 2021-07-11 DIAGNOSIS — U071 COVID-19: Secondary | ICD-10-CM | POA: Diagnosis not present

## 2021-07-11 DIAGNOSIS — F4310 Post-traumatic stress disorder, unspecified: Secondary | ICD-10-CM | POA: Diagnosis present

## 2021-07-11 DIAGNOSIS — M62272 Nontraumatic ischemic infarction of muscle, left ankle and foot: Secondary | ICD-10-CM

## 2021-07-11 DIAGNOSIS — I10 Essential (primary) hypertension: Secondary | ICD-10-CM | POA: Diagnosis present

## 2021-07-11 DIAGNOSIS — E119 Type 2 diabetes mellitus without complications: Secondary | ICD-10-CM

## 2021-07-11 DIAGNOSIS — Z951 Presence of aortocoronary bypass graft: Secondary | ICD-10-CM | POA: Diagnosis not present

## 2021-07-11 DIAGNOSIS — E669 Obesity, unspecified: Secondary | ICD-10-CM | POA: Diagnosis present

## 2021-07-11 DIAGNOSIS — L03116 Cellulitis of left lower limb: Secondary | ICD-10-CM

## 2021-07-11 DIAGNOSIS — I96 Gangrene, not elsewhere classified: Secondary | ICD-10-CM | POA: Diagnosis present

## 2021-07-11 DIAGNOSIS — M868X7 Other osteomyelitis, ankle and foot: Secondary | ICD-10-CM | POA: Diagnosis not present

## 2021-07-11 DIAGNOSIS — Z9861 Coronary angioplasty status: Secondary | ICD-10-CM | POA: Diagnosis not present

## 2021-07-11 DIAGNOSIS — Z87891 Personal history of nicotine dependence: Secondary | ICD-10-CM | POA: Diagnosis not present

## 2021-07-11 LAB — CBG MONITORING, ED: Glucose-Capillary: 213 mg/dL — ABNORMAL HIGH (ref 70–99)

## 2021-07-11 LAB — RESP PANEL BY RT-PCR (FLU A&B, COVID) ARPGX2
Influenza A by PCR: NEGATIVE
Influenza B by PCR: NEGATIVE
SARS Coronavirus 2 by RT PCR: POSITIVE — AB

## 2021-07-11 LAB — GLUCOSE, CAPILLARY
Glucose-Capillary: 210 mg/dL — ABNORMAL HIGH (ref 70–99)
Glucose-Capillary: 231 mg/dL — ABNORMAL HIGH (ref 70–99)

## 2021-07-11 MED ORDER — MORPHINE SULFATE (PF) 4 MG/ML IV SOLN: 4.0000 mg | Freq: Once | INTRAVENOUS | Status: DC

## 2021-07-11 MED ORDER — NITROGLYCERIN 0.4 MG SL SUBL: 0.4000 mg | SUBLINGUAL_TABLET | SUBLINGUAL | Status: DC | PRN

## 2021-07-11 MED ORDER — METRONIDAZOLE 500 MG/100ML IV SOLN
500.0000 mg | Freq: Three times a day (TID) | INTRAVENOUS | Status: DC
  Administered 2021-07-11 – 2021-07-17 (×18): 500 mg via INTRAVENOUS
  Filled 2021-07-11 (×18): qty 100

## 2021-07-11 MED ORDER — SODIUM CHLORIDE 0.9 % IV BOLUS
1000.0000 mL | Freq: Once | INTRAVENOUS | Status: AC
  Administered 2021-07-11: 1000 mL via INTRAVENOUS

## 2021-07-11 MED ORDER — ONDANSETRON HCL 4 MG/2ML IJ SOLN
4.0000 mg | Freq: Once | INTRAMUSCULAR | Status: AC
  Administered 2021-07-11: 4 mg via INTRAVENOUS
  Filled 2021-07-11: qty 2

## 2021-07-11 MED ORDER — TRAZODONE HCL 50 MG PO TABS
25.0000 mg | ORAL_TABLET | Freq: Every day | ORAL | Status: DC
  Administered 2021-07-11: 25 mg via ORAL
  Filled 2021-07-11: qty 1

## 2021-07-11 MED ORDER — ACETAMINOPHEN 650 MG RE SUPP: 650.0000 mg | Freq: Four times a day (QID) | RECTAL | Status: DC | PRN

## 2021-07-11 MED ORDER — DULOXETINE HCL 30 MG PO CPEP
30.0000 mg | ORAL_CAPSULE | Freq: Every day | ORAL | Status: DC
  Administered 2021-07-11 – 2021-07-17 (×6): 30 mg via ORAL
  Filled 2021-07-11 (×6): qty 1

## 2021-07-11 MED ORDER — FENTANYL CITRATE (PF) 100 MCG/2ML IJ SOLN
50.0000 ug | Freq: Once | INTRAMUSCULAR | Status: AC
  Administered 2021-07-11: 50 ug via INTRAVENOUS
  Filled 2021-07-11: qty 2

## 2021-07-11 MED ORDER — HYDROCODONE-ACETAMINOPHEN 5-325 MG PO TABS
1.0000 | ORAL_TABLET | ORAL | Status: DC | PRN
  Administered 2021-07-12 – 2021-07-13 (×4): 2 via ORAL
  Filled 2021-07-11 (×4): qty 2

## 2021-07-11 MED ORDER — ACETAMINOPHEN 325 MG PO TABS: 650.0000 mg | ORAL_TABLET | Freq: Four times a day (QID) | ORAL | Status: DC | PRN

## 2021-07-11 MED ORDER — CITALOPRAM HYDROBROMIDE 10 MG PO TABS: 20.0000 mg | ORAL_TABLET | Freq: Every morning | ORAL | Status: DC

## 2021-07-11 MED ORDER — ATORVASTATIN CALCIUM 40 MG PO TABS
40.0000 mg | ORAL_TABLET | Freq: Every morning | ORAL | Status: DC
  Administered 2021-07-12 – 2021-07-17 (×5): 40 mg via ORAL
  Filled 2021-07-11 (×5): qty 1

## 2021-07-11 MED ORDER — INSULIN ASPART 100 UNIT/ML IJ SOLN
0.0000 [IU] | Freq: Three times a day (TID) | INTRAMUSCULAR | Status: DC
  Administered 2021-07-11 – 2021-07-12 (×2): 5 [IU] via SUBCUTANEOUS
  Administered 2021-07-12: 3 [IU] via SUBCUTANEOUS
  Administered 2021-07-12: 5 [IU] via SUBCUTANEOUS
  Administered 2021-07-13: 2 [IU] via SUBCUTANEOUS
  Administered 2021-07-13: 8 [IU] via SUBCUTANEOUS
  Administered 2021-07-13: 5 [IU] via SUBCUTANEOUS
  Administered 2021-07-14: 3 [IU] via SUBCUTANEOUS
  Administered 2021-07-15: 2 [IU] via SUBCUTANEOUS
  Administered 2021-07-15 (×2): 5 [IU] via SUBCUTANEOUS
  Administered 2021-07-16: 11 [IU] via SUBCUTANEOUS
  Administered 2021-07-16: 3 [IU] via SUBCUTANEOUS

## 2021-07-11 MED ORDER — HYDROCODONE-ACETAMINOPHEN 5-325 MG PO TABS
1.0000 | ORAL_TABLET | ORAL | Status: DC | PRN
  Administered 2021-07-11 (×2): 1 via ORAL
  Filled 2021-07-11 (×2): qty 1

## 2021-07-11 MED ORDER — HYDROMORPHONE HCL 1 MG/ML IJ SOLN
1.0000 mg | INTRAMUSCULAR | Status: AC | PRN
  Administered 2021-07-11: 1 mg via INTRAVENOUS
  Filled 2021-07-11: qty 1

## 2021-07-11 MED ORDER — SENNOSIDES-DOCUSATE SODIUM 8.6-50 MG PO TABS: 1.0000 | ORAL_TABLET | Freq: Every evening | ORAL | Status: DC | PRN

## 2021-07-11 MED ORDER — SODIUM CHLORIDE 0.9 % IV SOLN
2.0000 g | Freq: Two times a day (BID) | INTRAVENOUS | Status: DC
  Administered 2021-07-11 – 2021-07-13 (×5): 2 g via INTRAVENOUS
  Filled 2021-07-11 (×5): qty 2

## 2021-07-11 MED ORDER — ENOXAPARIN SODIUM 40 MG/0.4ML IJ SOSY
40.0000 mg | PREFILLED_SYRINGE | INTRAMUSCULAR | Status: DC
Start: 2021-07-11 — End: 2021-07-15
  Administered 2021-07-11 – 2021-07-15 (×4): 40 mg via SUBCUTANEOUS
  Filled 2021-07-11 (×4): qty 0.4

## 2021-07-11 MED ORDER — VANCOMYCIN HCL 2000 MG/400ML IV SOLN
2000.0000 mg | Freq: Once | INTRAVENOUS | Status: AC
  Administered 2021-07-11: 2000 mg via INTRAVENOUS
  Filled 2021-07-11: qty 400

## 2021-07-11 MED ORDER — CLOPIDOGREL BISULFATE 75 MG PO TABS
75.0000 mg | ORAL_TABLET | Freq: Every morning | ORAL | Status: DC
  Administered 2021-07-12 – 2021-07-16 (×4): 75 mg via ORAL
  Filled 2021-07-11 (×4): qty 1

## 2021-07-11 MED ORDER — METOPROLOL TARTRATE 50 MG PO TABS
50.0000 mg | ORAL_TABLET | Freq: Two times a day (BID) | ORAL | Status: DC
  Administered 2021-07-11 – 2021-07-13 (×4): 50 mg via ORAL
  Filled 2021-07-11 (×2): qty 1
  Filled 2021-07-11: qty 2
  Filled 2021-07-11 (×2): qty 1

## 2021-07-11 MED ORDER — PIPERACILLIN-TAZOBACTAM 3.375 G IVPB 30 MIN
3.3750 g | Freq: Once | INTRAVENOUS | Status: AC
  Administered 2021-07-11: 3.375 g via INTRAVENOUS
  Filled 2021-07-11: qty 50

## 2021-07-11 MED ORDER — VANCOMYCIN HCL IN DEXTROSE 1-5 GM/200ML-% IV SOLN
1000.0000 mg | INTRAVENOUS | Status: DC
  Administered 2021-07-12 – 2021-07-17 (×5): 1000 mg via INTRAVENOUS
  Filled 2021-07-11 (×6): qty 200

## 2021-07-11 MED ORDER — SODIUM CHLORIDE 0.9 % IV SOLN: 2.0000 g | Freq: Two times a day (BID) | INTRAVENOUS | Status: DC

## 2021-07-11 MED ORDER — INSULIN GLARGINE 100 UNIT/ML ~~LOC~~ SOLN
18.0000 [IU] | Freq: Every day | SUBCUTANEOUS | Status: DC
  Administered 2021-07-11 – 2021-07-12 (×2): 18 [IU] via SUBCUTANEOUS
  Filled 2021-07-11 (×4): qty 0.18

## 2021-07-11 NOTE — Progress Notes (Addendum)
Pharmacy Antibiotic Note  [name] is a 75 y.o. male admitted on 07/10/2021 with foot infection. Patient with a history of T2DM, hyperlipidemia, MI, PVD, and stroke. Patient presented with chief complaint of foot infection that has been ongoing since March of 2022. Patient is s/p amputation of the fourth digit of the left foot due to gangrene in March.  Pharmacy has been consulted for vancomycin dosing. Imaging significant for Erosion/osteomyelitis of the third and fourth metatarsal heads and the third proximal phalanx.  Plan: Cefepime 2g q12h Vancomycin 2000mg  once then; Vancomycin 1000 IV every 24 hours.  Goal trough 10-15 mcg/mL. Estimated AUC 490.5. Pharmacy to adjust dosing per renal function F/u vascular and ortho recommendations F/u cultures - de-escalate when appropriate  Height: 5\' 8"  (172.7 cm) Weight: 94.9 kg (209 lb 3.2 oz) IBW/kg (Calculated) : 68.4  Temp (24hrs), Avg:98.8 F (37.1 C), Min:97.8 F (36.6 C), Max:99.5 F (37.5 C)  Recent Labs  Lab 07/09/21 2027 07/10/21 1836 07/10/21 1912  WBC 11.3* 13.3*  --   CREATININE 1.27* 1.37*  --   LATICACIDVEN 2.3*  --  1.3    Estimated Creatinine Clearance: 53.7 mL/min (A) (by C-G formula based on SCr of 1.37 mg/dL (H)).    Allergies  Allergen Reactions   Ace Inhibitors     Heart attack    Lisinopril     Heart attack    Antimicrobials this admission: Zosyn x 1 7/22  Cefepime 7/22 >> Vancomycin 7/22 >>   Microbiology results: Pending  Thank you for allowing pharmacy to be a part of this patient's care.  8/22 07/11/2021 7:34 AM

## 2021-07-11 NOTE — H&P (Signed)
Date: 07/11/2021               Patient Name:  [name] MRN: [id number]  DOB: [birth year] Age / Sex: 75 y.o., male   [facility]: [facility]         Medical Service: Internal Medicine Teaching Service         Attending Physician: Dr. Inez Catalina, MD    First Contact: Dr. Sharene Butters Pager: [phone]  Second Contact: Dr. Karilyn Cota Pager: [phone]       After Hours (After 5p/  First Contact Pager: [phone]  weekends / holidays): Second Contact Pager: [phone]   Chief Complaint: Foot Infection  History of Present Illness:  [name] is a 75 year old male with a history of type 2 diabetes mellitus, hyperlipidemia, myocardial infarction (2005), PVD, and stroke (2008), who presents to the ED with a foot infection.  Patient states that his symptoms began back on March 19, 2021 after having amputation of the fourth digit of the left foot due to gangrene.  He states that since then his foot has been red, painful, and has not healed as expected.  He had his toe amputation completed by vascular, and was referred to wound care for further recommendations and debridement.  Patient states he has been given multiple antibiotics, including Augmentin, Bactrim, and clindamycin, which have not adequately quelled his symptoms. After his most recent antibiotic course, he was told to present to the emergency department for IV medications.  He denies fevers, nausea, vomiting, chest pain, abdominal pain, myalgias, constipation, or diarrhea.  He endorses foot pain.  Meds:  No outpatient medications have been marked as taking for the 07/10/21 encounter [facility] Encounter).     Allergies: Allergies as of 07/10/2021 - Review Complete 07/10/2021  Allergen Reaction Noted   Ace inhibitors  01/21/2018   Lisinopril     Past Medical History:  Diagnosis Date   CAD, AUTOLOGOUS BYPASS GRAFT    Depression    DIABETES, TYPE 2    DYSPNEA    on exertion   HYPERLIPIDEMIA    OBESITY     OBSTRUCTIVE SLEEP APNEA    cpap   Old myocardial infarction 2005   Peripheral vascular disease (HCC)    PTSD (post-traumatic stress disorder)    Stroke (HCC) 09/2007   light stroke    Family History:   Family History  Problem Relation Age of Onset   Diabetes Mother    Alcoholism Father      Social History:  Occupation: Medically retired from the [employer].  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 Housing: Lives at home in [place] with wife with 50 years. Social History   Socioeconomic History   Marital status: Married    Spouse name: Not on file   Number of children: Not on file   Years of education: Not on file   Highest education level: Not on file  Occupational History   Not on file  Tobacco Use   Smoking status: Former    Types: Pipe   Smokeless tobacco: Never  Vaping Use   Vaping Use: Never used  Substance and Sexual Activity   Alcohol use: Never   Drug use: Never   Sexual activity: Not on file  Other Topics Concern   Not on file  Social History Narrative   Not on file   Social Determinants of Health   Financial Resource Strain: Not on file  Food  Insecurity: Not on file  Transportation Needs: Not on file  Physical Activity: Not on file  Stress: Not on file  Social Connections: Not on file  Intimate Partner Violence: Not on file     Review of Systems: A complete ROS was negative except as per HPI.   Physical Exam: Blood pressure (!) 99/58, pulse 90, temperature 97.8 F (36.6 C), temperature source Oral, resp. rate (!) 22, height 5\' 8"  (1.727 m), weight 94.9 kg, SpO2 99 %. Physical Exam Vitals and nursing note reviewed.  Constitutional:      Appearance: He is obese. He is not toxic-appearing or diaphoretic.     Comments: Appears uncomfortable. Answers questions appropriately.   HENT:     Head: Normocephalic and atraumatic.  Cardiovascular:     Rate and Rhythm: Normal rate and regular rhythm.      Pulses: Normal pulses.     Heart sounds: Normal heart sounds. No murmur heard.   No friction rub. No gallop.  Pulmonary:     Effort: Pulmonary effort is normal.     Breath sounds: Normal breath sounds. No wheezing, rhonchi or rales.  Abdominal:     General: Bowel sounds are normal. There is no distension.     Palpations: Abdomen is soft.     Tenderness: There is no abdominal tenderness. There is no guarding.  Musculoskeletal:        General: No swelling.     Right lower leg: No edema.     Left lower leg: No edema.  Skin:    General: Skin is warm.     Findings: Erythema and lesion present.     Comments: Left foot missing fourth digit, with drainage at the surgical site..  Diffuse erythema of the second and third digit extending across the forefoot.  Third digit with ischemic change,  Tender to palpation.    Neurological:     General: No focal deficit present.     Mental Status: He is alert and oriented to person, place, and time.  Psychiatric:        Mood and Affect: Mood normal.        Behavior: Behavior normal.        EKG: personally reviewed my interpretation is ordered  DG Foot Complete Left  Result Date: 07/11/2021 CLINICAL DATA:  Left foot infection. EXAM: LEFT FOOT - COMPLETE 3+ VIEW COMPARISON:  07/09/2021 FINDINGS: Stable from 2 days ago. Erosions at the third and fourth metatarsal heads and at the base of the third proximal phalanx. The fourth toe has been amputated. Generalized osteopenia and arterial calcification. No opaque foreign body or soft tissue emphysema. IMPRESSION: 1. Stable from 2 days ago. 2. Erosion/osteomyelitis of the third and fourth metatarsal heads and the third proximal phalanx. Electronically Signed   By: 07/11/2021 M.D.   On: 07/11/2021 08:53     Assessment & Plan by Problem: Active Problems:   Osteomyelitis [facility])  [name] is a 75 year old male with a past medical history of type 2 diabetes mellitus, PVD, hypertension, CAD, and  prior left foot fourth digit amputation presents with foot pain and admitted for treatment of osteomyelitis of his left foot.  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  Patient had undergone a fourth digit amputation back in March of this year.  He has tried several antibiotics including clindamycin, Bactrim, and Augmentin  for recurrent cellulitis, which have failed.  Patient's most recent ABIs show 0.3 indicating less than optimal blood flow to the affected limb, although the left lower extremity arteries were noncompressible.  Patient had complete left foot x-ray on 07/09/2021 which showed erosions of the third and fourth metatarsal heads in the proximal phalanx of the third digit, which is concerning for osteomyelitis.  Repeat complete left foot today shows findings consistent with complete foot x-ray 2 days prior.  His labs are significant for leukocytosis of 13.2 from 11 yesterday.  Patient initiated on Vanco and Zosyn in the ED, with a consultation to vascular who recommended admission with initiation of broad-spectrum antibiotics.  No nephrotoxicity with vancomycin and Zosyn, with concern for pseudomonal activity in this man with diabetes, will continue vancomycin but discontinue Zosyn in favor of cefepime and add metronidazole for anaerobic coverage. Given his history of PVD/PAD may need revascularization for IV antibiotics to take full effect and have source control.  We will continue DAPT. - Continue vancomycin per pharmacy - Discontinue Zosyn - Start cefepime per pharmacy - Metronidazole IV 500 mg every 8 hours - Appreciate vascular surgery's recommendations. - Start Norco/Vicodin 5-325 mg every 4 hours as needed for moderate pain. - Trend CBC - Trend vitals - Continue aspirin 81 mg daily - Can continue Plavix 75 mg daily  Type 2 Diabetes Myelitis: Per  chart review, patient is on home regimen of Ozempic 1 mg every Monday, metformin 1000 mg twice daily, and NovoLog Mix 70/30 30 units twice daily. - Lantus 18 units - SSI  Anxiety Depression: Patient on citalopram and duloxetine as his home regimen.  His duloxetine was recently increased to 30 mg secondary to left foot pain and mood benefits.  We will continue home regimen during his hospital stay. - Citalopram 20 mg daily - Duloxetine 30 mg daily  Remote history of Myocardial Infarction Hyperlipidemia: Occurred in 2005. is allergic to ACE inhibitors, but on Lopressor tartrate 50 mg twice daily, atorvastatin 40 mg daily, and aspirin 81 mg daily. - Lopressor tartrate 50 mg twice daily - Atorvastatin 40 mg daily - Aspirin 81 monitor daily  Dispo: Admit patient to Inpatient with expected length of stay greater than 2 midnights.  Signed: Dolan Amen, MD 07/11/2021, 11:04 AM  Pager: [phone] After 5pm on weekdays and 1pm on weekends: On Call pager: [phone]

## 2021-07-11 NOTE — Progress Notes (Signed)
Contacted IP to see if patient needed to remain on airborne precautions. Currently on precautions for positive Covid 19 screening but patient states he was Covid positive back in June (does not look like he was tested within [facility] system). Patient updated as to visitor policy. Oncoming Nurse notified that IP has been contacted but may not hear back until tomorrow.

## 2021-07-11 NOTE — Consult Note (Addendum)
Hospital Consult    Reason for Consult:  cellulitis/left 4th toe ischemia Requesting Physician:  Dr Bernette MayersYao/ED MRN #:  [id number]  History of Present Illness: This is a 75 y.o. male [name] is a 75 y.o. male that presents to ED on recommendation of wound care clinic.  He has a known history of PAD and is diabetic. He has had close follow-up after arteriogram and left 4th toe amputation.  He continues with complaints of significant pain in his left foot.  He denies fever. His wife is at bedside.  He is status post left lower extremity angiogram with Dr. Randie Heinzain on 03/10/2021 and had tibial disase but inline flow with no intervention.  He then underwent a left fourth toe amputation on 03/18/2021 with Dr. Randie Heinzain.  The amputation site was debrided on 05/02/2021 by Dr. Chestine Sporelark. Last seen in our office on 07/01/2021 and prescribed Augmentin and follow-up arranged.  The pt is on a statin for cholesterol management.  The pt is on a daily aspirin.   Other AC:  Plavix The pt is on BB,lasix for hypertension/CAD The pt is diabetic.  metformin  Past Medical History:  Diagnosis Date   CAD, AUTOLOGOUS BYPASS GRAFT    Depression    DIABETES, TYPE 2    DYSPNEA    on exertion   HYPERLIPIDEMIA    OBESITY    OBSTRUCTIVE SLEEP APNEA    cpap   Old myocardial infarction 2005   Peripheral vascular disease (HCC)    PTSD (post-traumatic stress disorder)    Stroke (HCC) 09/2007   light stroke    Past Surgical History:  Procedure Laterality Date   ABDOMINAL AORTOGRAM W/LOWER EXTREMITY Bilateral 03/10/2021   Procedure: ABDOMINAL AORTOGRAM W/LOWER EXTREMITY;  Surgeon: Maeola Harmanain, Brandon Christopher, MD;  [facility];  Service: Cardiovascular;  Laterality: Bilateral;   AMPUTATION Left 03/18/2021   Procedure: LEFT FOURTH TOE AMPUTATION;  Surgeon: Maeola Harmanain, Brandon Christopher, MD;  [facility];  Service: Vascular;  Laterality: Left;   APPENDECTOMY  1965   CORONARY ARTERY BYPASS GRAFT  2008   [facility]   cyst remove on lelt leg,posterior     LOWER EXTREMITY ANGIOGRAPHY N/A 03/07/2018   Procedure: LOWER EXTREMITY ANGIOGRAPHY;  Surgeon: Maeola Harmanain, Brandon Christopher, MD;  [facility];  Service: Cardiovascular;  Laterality: N/A;   LOWER EXTREMITY ANGIOGRAPHY Bilateral 05/29/2019   Procedure: LOWER EXTREMITY ANGIOGRAPHY;  Surgeon: Maeola Harmanain, Brandon Christopher, MD;  [facility];  Service: Cardiovascular;  Laterality: Bilateral;   PERIPHERAL VASCULAR ATHERECTOMY Right 03/07/2018   Procedure: PERIPHERAL VASCULAR ATHERECTOMY;  Surgeon: Maeola Harmanain, Brandon Christopher, MD;  [facility];  Service: Cardiovascular;  Laterality: Right;  anterioer tibial   VASECTOMY     WOUND DEBRIDEMENT Left 05/02/2021   Procedure: Left 4th toe amputation site debridement including metatarsal head;  Surgeon: Cephus Shellinglark, Christopher J, MD;  [facility];  Service: Vascular;  Laterality: Left;    Allergies  Allergen Reactions   Ace Inhibitors     Heart attack    Lisinopril     Heart attack    Prior to Admission medications   Medication Sig Start Date End Date Taking? Authorizing Provider  acetaminophen (TYLENOL) 500 MG tablet Take 1,500 mg by mouth 2 (two) times daily as needed (pain).    [provider]  amoxicillin-clavulanate (AUGMENTIN) 875-125 MG tablet Take 1 tablet by mouth 2 (two) times daily. 07/01/21   Cephus Shellinglark, Christopher J, MD  aspirin EC  325 MG tablet Take 325 mg by mouth in the morning.    [provider]  atorvastatin (LIPITOR) 40 MG tablet Take 40 mg by mouth in the morning.    [provider]  Charcoal Activated (ACTIVATED CHARCOAL PO) Take 520 mg by mouth in the morning and at bedtime.    [provider]  Cholecalciferol 2000 units TABS Take 2,000 Units by mouth in the morning.    [provider]  citalopram (CELEXA) 40 MG tablet Take 20 mg by mouth in the morning.    [provider]  clopidogrel (PLAVIX) 75  MG tablet Take 75 mg by mouth in the morning.    [provider]  Coenzyme Q10 (COQ-10) 200 MG CAPS Take 200 mg by mouth in the morning.    [provider]  fluorouracil (EFUDEX) 5 % cream Apply 1 application topically daily.    [provider]  furosemide (LASIX) 40 MG tablet Take 40 mg by mouth in the morning.    [provider]  Ch[place] Si Tea, Camellia sinensis, (GREEN TEA EXTRACT PO) Take 800 mg by mouth 2 (two) times a day. 400 mg each    [provider]  HYDROcodone-acetaminophen (NORCO) 5-325 MG tablet Take 1 tablet by mouth every 6 (six) hours as needed for moderate pain. 07/01/21   Cephus Shelling, MD  insulin aspart protamine- aspart (NOVOLOG MIX 70/30) (70-30) 100 UNIT/ML injection Inject 30 Units into the skin in the morning and at bedtime.    [provider]  Melatonin-Theanine (MELATONIN FORTE/L-THEANINE PO) Take 2 tablets by mouth at bedtime. OLLY Sleep gummies    [provider]  metFORMIN (GLUCOPHAGE) 1000 MG tablet Take 1,000 mg by mouth in the morning and at bedtime.    [provider]  metoprolol tartrate (LOPRESSOR) 50 MG tablet Take 50 mg by mouth 2 (two) times daily.    [provider]  Misc Natural Products (NEURIVA) CAPS Take 1 capsule by mouth at bedtime.    [provider]  Multiple Vitamin (MULTIVITAMIN WITH MINERALS) TABS tablet Take 1 tablet by mouth in the morning, at noon, and at bedtime. Men's 50+    [provider]  nitroGLYCERIN (NITROSTAT) 0.4 MG SL tablet Place 0.4 mg under the tongue every 5 (five) minutes x 3 doses as needed for chest pain.    [provider]  OVER THE COUNTER MEDICATION Take 1 tablet by mouth 3 (three) times daily with meals. Sugar Blockers    [provider]  OVER THE COUNTER MEDICATION 5 sprays by Mouth Rinse route daily. Mouth coat    [provider]  Resveratrol 250 MG CAPS Take 250 mg by mouth daily with lunch.     [provider]  Semaglutide, 1 MG/DOSE, (OZEMPIC, 1 MG/DOSE,) 4 MG/3ML SOPN Inject 1 mg into the skin every Monday.    [provider]  sulfamethoxazole-trimethoprim (BACTRIM) 400-80 MG tablet Take 1 tablet by mouth 2 (two) times daily. 05/13/21   [name], PA-C  traZODone (DESYREL) 50 MG tablet Take 25 mg by mouth at bedtime.     [provider]    Social History   Socioeconomic History   Marital status: Married    Spouse name: Not on file   Number of children: Not on file   Years of education: Not on file   Highest education level: Not on file  Occupational History   Not on file  Tobacco Use   Smoking status: Former  Types: Pipe   Smokeless tobacco: Never  Vaping Use   Vaping Use: Never used  Substance and Sexual Activity   Alcohol use: Never   Drug use: Never   Sexual activity: Not on file  Other Topics Concern   Not on file  Social History Narrative   Not on file   Social Determinants of Health   Financial Resource Strain: Not on file  Food Insecurity: Not on file  Transportation Needs: Not on file  Physical Activity: Not on file  Stress: Not on file  Social Connections: Not on file  Intimate Partner Violence: Not on file     Family History  Problem Relation Age of Onset   Diabetes Mother    Alcoholism Father     ROS: [x]  Positive   [ ]  Negative   [ ]  All sytems reviewed and are negative  Cardiac: []  chest pain/pressure []  palpitations []  SOB lying flat []  DOE  Vascular: []  pain in legs while walking [x]  pain in legs at rest>>see HPI []  pain in legs at night []  non-healing ulcers []  hx of DVT []  swelling in legs  Pulmonary: []  productive cough []  asthma/wheezing []  home O2  Neurologic: []  weakness in []  arms []  legs []  numbness in []  arms []  legs []  hx of CVA []  mini stroke [] difficulty speaking or slurred speech []  temporary loss of vision in one eye []  dizziness  Hematologic: []  hx of cancer []   bleeding problems []  problems with blood clotting easily  Endocrine:   [x]  diabetes []  thyroid disease  GI []  vomiting blood []  blood in stool  GU: []  CKD/renal failure []  HD--[]  M/W/F or []  T/T/S []  burning with urination []  blood in urine  Psychiatric: []  anxiety [x]  depression  Musculoskeletal: []  arthritis []  joint pain  Integumentary: []  rashes []  ulcers  Constitutional: []  fever []  chills   Physical Examination  Vitals:   07/11/21 0633 07/11/21 0756  BP: (!) 119/43 (!) 105/45  Pulse: (!) 116 91  Resp: 20 16  Temp: 97.8 F (36.6 C)   SpO2: 99% 97%   Body mass index is 31.81 kg/m.  General:  WDWN in NAD Gait: Not observed HENT: WNL, normocephalic Pulmonary: normal non-labored breathing, Cardiac: regular,  Extremities: with ischemic changes, with Gangrene , with cellulitis; with open wounds; erythema of foot extending along lateral ankle/lower leg. Can wiggle left great toe. Intact sensation. Foot warm. Biphasic DP, PT and peroneal Doppler signals Musculoskeletal: no muscle wasting or atrophy  Neurologic: A&O X 3;  No focal weakness or paresthesias are detected; speech is fluent/normal Psychiatric:  The pt has Normal affect.   CBC    Component Value Date/Time   WBC 13.3 (H) 07/10/2021 1836   RBC 4.08 (L) 07/10/2021 1836   HGB 12.7 (L) 07/10/2021 1836   HCT 39.4 07/10/2021 1836   PLT 294 07/10/2021 1836   MCV 96.6 07/10/2021 1836   MCH 31.1 07/10/2021 1836   MCHC 32.2 07/10/2021 1836   RDW 13.2 07/10/2021 1836   LYMPHSABS 1.8 07/10/2021 1836   MONOABS 1.5 (H) 07/10/2021 1836   EOSABS 0.1 07/10/2021 1836   BASOSABS 0.0 07/10/2021 1836    BMET    Component Value Date/Time   NA 133 (L) 07/10/2021 1836   K 4.7 07/10/2021 1836   CL 99 07/10/2021 1836   CO2 28 07/10/2021 1836   GLUCOSE 120 (H) 07/10/2021 1836   BUN 21 07/10/2021 1836   CREATININE 1.37 (H) 07/10/2021 1836   CALCIUM 9.5  07/10/2021 1836   GFRNONAA 54 (L) 07/10/2021 1836    GFRAA 88 08/30/2008 1249    COAGS: Lab Results  Component Value Date   INR 1.4 09/22/2007   INR 1.0 09/17/2007   INR 0.9 09/16/2007   EXAM: LEFT FOOT - COMPLETE 3+ VIEW   COMPARISON:  07/09/2021   FINDINGS: Stable from 2 days ago. Erosions at the third and fourth metatarsal heads and at the base of the third proximal phalanx. The fourth toe has been amputated. Generalized osteopenia and arterial calcification. No opaque foreign body or soft tissue emphysema.   IMPRESSION: 1. Stable from 2 days ago. 2. Erosion/osteomyelitis of the third and fourth metatarsal heads and the third proximal phalanx.     Electronically Signed   By: Marnee Spring M.D.   On: 07/11/2021 08:53  Non-Invasive Vascular Imaging:   Prior ABIs>>non-compressible  Arteriogram 03/10/2021: Findings: The aorta and iliac segments are free of flow-limiting stenosis.  Bilateral renal arteries are patent no flow-limiting stenosis.  The right side the SFA popliteal arteries are patent.  Appears to have runoff via the posterior tibial artery in line to the foot on the right.  On the left side which is the site of interest he possibly has a low-grade stenosis in the mid anterior tibial artery after the SFA and popliteal are patent.  His posterior tibial artery is smaller however has inline flow all the way and fills the arch of the foot and no intervention was undertaken.  Peroneal artery is occluded shortly after the takeoff    ASSESSMENT/PLAN: This is a 75 y.o. diabetic male with left 3rd toe ischemia, cellulitis of forefoot extending into the plantar surface of base of 3rd toe. Osteomyelitis of left 3rd and 4th metatarsal heads.  Since arteriogram was performed 4 months ago and he has decent Doppler signals; doubt repeat arteriogram is warranted.  Mostly likely needs left transmetatarsal amputation versus left BKA. Dr. Lenell Antu, vascular surgeon on-call, will review case,arteriogram and provide definitive  recommendations.  -Wendi Maya, PA-C [facility] [phone]   VASCULAR STAFF ADDENDUM: I have independently interviewed and examined the patient. I agree with the above.  Patient well known to our service. He has progressing left forefoot ischemia and infection. Angiogram from March reviewed in detail.  He has severe tibial disease.  Anterior tibial artery is dominant and courses to the foot. I offered him 2 options: one I am going would be progression to below-knee amputation; the other would be attempt at limb salvage with likely transmetatarsal amputation.   He would like to try to save what remains of his left foot. We will plan to proceed to redo angiogram early next week.  If we are able to improve the flow to his foot we will arrange for a transmetatarsal amputation later that week.  Otherwise he will need a below-knee amputation.  Rande Brunt. Lenell Antu, MD [facility] Phone Number: [phone] 07/11/2021 10:00 AM

## 2021-07-12 DIAGNOSIS — E119 Type 2 diabetes mellitus without complications: Secondary | ICD-10-CM | POA: Diagnosis not present

## 2021-07-12 DIAGNOSIS — I739 Peripheral vascular disease, unspecified: Secondary | ICD-10-CM

## 2021-07-12 DIAGNOSIS — R609 Edema, unspecified: Secondary | ICD-10-CM

## 2021-07-12 DIAGNOSIS — E785 Hyperlipidemia, unspecified: Secondary | ICD-10-CM

## 2021-07-12 DIAGNOSIS — M869 Osteomyelitis, unspecified: Secondary | ICD-10-CM

## 2021-07-12 DIAGNOSIS — U071 COVID-19: Secondary | ICD-10-CM | POA: Diagnosis not present

## 2021-07-12 DIAGNOSIS — F419 Anxiety disorder, unspecified: Secondary | ICD-10-CM

## 2021-07-12 DIAGNOSIS — F32A Depression, unspecified: Secondary | ICD-10-CM

## 2021-07-12 LAB — COMPREHENSIVE METABOLIC PANEL
ALT: 24 U/L (ref 0–44)
AST: 21 U/L (ref 15–41)
Albumin: 2.6 g/dL — ABNORMAL LOW (ref 3.5–5.0)
Alkaline Phosphatase: 78 U/L (ref 38–126)
Anion gap: 7 (ref 5–15)
BUN: 17 mg/dL (ref 8–23)
CO2: 26 mmol/L (ref 22–32)
Calcium: 8.9 mg/dL (ref 8.9–10.3)
Chloride: 101 mmol/L (ref 98–111)
Creatinine, Ser: 1.2 mg/dL (ref 0.61–1.24)
GFR, Estimated: >60 mL/min (ref >60)
Glucose, Bld: 244 mg/dL — ABNORMAL HIGH (ref 70–99)
Potassium: 4.8 mmol/L (ref 3.5–5.1)
Sodium: 134 mmol/L — ABNORMAL LOW (ref 135–145)
Total Bilirubin: 1 mg/dL (ref 0.3–1.2)
Total Protein: 6.3 g/dL — ABNORMAL LOW (ref 6.5–8.1)

## 2021-07-12 LAB — HEMOGLOBIN A1C
Hgb A1c MFr Bld: 7.2 % — ABNORMAL HIGH (ref 4.8–5.6)
Mean Plasma Glucose: 160 mg/dL

## 2021-07-12 LAB — CBC
HCT: 34.7 % — ABNORMAL LOW (ref 39.0–52.0)
Hemoglobin: 11.6 g/dL — ABNORMAL LOW (ref 13.0–17.0)
MCH: 31.8 pg (ref 26.0–34.0)
MCHC: 33.4 g/dL (ref 30.0–36.0)
MCV: 95.1 fL (ref 80.0–100.0)
Platelets: 272 10*3/uL (ref 150–400)
RBC: 3.65 MIL/uL — ABNORMAL LOW (ref 4.22–5.81)
RDW: 13.3 % (ref 11.5–15.5)
WBC: 10.1 10*3/uL (ref 4.0–10.5)
nRBC: 0 % (ref 0.0–0.2)

## 2021-07-12 LAB — GLUCOSE, CAPILLARY
Glucose-Capillary: 181 mg/dL — ABNORMAL HIGH (ref 70–99)
Glucose-Capillary: 235 mg/dL — ABNORMAL HIGH (ref 70–99)
Glucose-Capillary: 216 mg/dL — ABNORMAL HIGH (ref 70–99)
Glucose-Capillary: 175 mg/dL — ABNORMAL HIGH (ref 70–99)

## 2021-07-12 MED ORDER — TRAZODONE HCL 50 MG PO TABS
25.0000 mg | ORAL_TABLET | Freq: Every day | ORAL | Status: DC
  Administered 2021-07-12 – 2021-07-16 (×5): 25 mg via ORAL
  Filled 2021-07-12 (×5): qty 1

## 2021-07-12 MED ORDER — PANTOPRAZOLE SODIUM 40 MG PO TBEC
40.0000 mg | DELAYED_RELEASE_TABLET | Freq: Every day | ORAL | Status: DC
  Administered 2021-07-12 – 2021-07-17 (×5): 40 mg via ORAL
  Filled 2021-07-12 (×5): qty 1

## 2021-07-12 MED ORDER — HYDROMORPHONE HCL 1 MG/ML IJ SOLN
0.5000 mg | Freq: Four times a day (QID) | INTRAMUSCULAR | Status: AC | PRN
  Administered 2021-07-12 – 2021-07-13 (×2): 0.5 mg via INTRAVENOUS
  Filled 2021-07-12 (×2): qty 0.5

## 2021-07-12 MED ORDER — CALCIUM CARBONATE ANTACID 500 MG PO CHEW
1.0000 | CHEWABLE_TABLET | Freq: Three times a day (TID) | ORAL | Status: DC
  Administered 2021-07-12 – 2021-07-17 (×14): 200 mg via ORAL
  Filled 2021-07-12 (×14): qty 1

## 2021-07-12 NOTE — Progress Notes (Signed)
Subjective: Overnight patient had worsening pain and was given a dose of IV Dilaudid.  This morning states he is feeling well as long as the pain medications do not wear off.  He understands that he may need a left BKA versus transmetatarsal amputation.  No complaints at this time.  Objective:  Vital signs in last 24 hours: Vitals:   07/11/21 1733 07/11/21 2102 07/12/21 0020 07/12/21 0500  BP: (!) 121/40 (!) 137/33 (!) 105/48 (!) 95/47  Pulse: 72 91 81 68  Resp: 20 18 17 17   Temp: 97.9 F (36.6 C) 98.3 F (36.8 C) 98.1 F (36.7 C) 97.7 F (36.5 C)  TempSrc: Axillary Oral Oral Oral  SpO2: 95% 97% 95% 92%  Weight:      Height:       Physical Exam General: alert, appears stated age, in no acute distress HEENT: Normocephalic, atraumatic, EOM intact, conjunctiva normal CV: Regular rate and rhythm, no murmurs rubs or gallops Pulm: Clear to auscultation bilaterally, normal work of breathing Abdomen: Soft, nondistended, bowel sounds present, no tenderness to palpation MSK: Erythema swelling and redness of left forefoot.  Fourth toe has been surgically removed and there is serous drainage from surgical site.  Third toe appears dark.  Decreased sensation along the left forefoot Skin: Erythema and warmth of the left forefoot Neuro: Alert and oriented x3  Assessment/Plan:  Active Problems:   Osteomyelitis [facility])  Mr. Zacary Bauer is a 75 year old male with a past medical history of type 2 diabetes mellitus, PVD, hypertension, CAD, and prior left foot fourth digit amputation presents with foot pain and admitted for treatment of osteomyelitis of his left foot.   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  Patient had left fourth digit amputation in March of this year and since then states that his pain and infection has continued to worsen.  Failed  multiple courses of antibiotics. Vascular surgery is following and recommends either left below-knee amputation versus transfer metatarsal amputation pending angiogram early next week.  Started on IV antibiotics.  Slightly worsening leukocytosis, 13 from 11.  Remains afebrile. -Vascular on board, appreciate recommendations - Continue vancomycin, cefepime and metronidazole -Continue oxycodone-acetaminophen 5-3 25 1  to 2 tablets every 4 hours as needed for pain -Continue aspirin and Plavix -Follow blood cultures -Trend WBC  COVID-positive Incidental finding on admission.  Patient reports testing positive back in June, unable to see these results patient denies any signs or symptoms of COVID infection.  RN contacted infection prevention to determine if patient needs airborne precautions.  Type 2 Diabetes Myelitis Home medications include Ozempic weekly, metformin 1000 mg twice daily and NovoLog Mix 70/30 30 units twice daily.  Patient will need good glucose control in the setting of infection and pending surgical amputation. - Lantus 18 units daily, uptitrate as needed - SSI   Anxiety Depression Will stop citalopram short-term to prevent risk of serotonin syndrome.   -Continue home medications trazodone and duloxetine daily   History of Myocardial Infarction Hyperlipidemia - Lopressor tartrate 50 mg twice daily - Atorvastatin 40 mg daily - Aspirin 81 monitor daily  Edema Patient is on furosemide 40 mg daily I presume for lower extremity edema.  Will hold in the setting of normotensive blood pressures  Prior to Admission Living Arrangement: Home Anticipated Discharge Location: SNF Barriers to Discharge: Medical workup/surgery  Dispo: Anticipated discharge pending arteriogram and surgical  planning.  [place] Lapaglia N, DO 07/12/2021, 9:07 AM Pager: [phone] After 5pm on weekdays and 1pm on weekends: On Call pager [phone]

## 2021-07-13 DIAGNOSIS — M869 Osteomyelitis, unspecified: Secondary | ICD-10-CM | POA: Diagnosis not present

## 2021-07-13 DIAGNOSIS — E119 Type 2 diabetes mellitus without complications: Secondary | ICD-10-CM | POA: Diagnosis not present

## 2021-07-13 DIAGNOSIS — I739 Peripheral vascular disease, unspecified: Secondary | ICD-10-CM | POA: Diagnosis not present

## 2021-07-13 DIAGNOSIS — U071 COVID-19: Secondary | ICD-10-CM | POA: Diagnosis not present

## 2021-07-13 LAB — GLUCOSE, CAPILLARY
Glucose-Capillary: 267 mg/dL — ABNORMAL HIGH (ref 70–99)
Glucose-Capillary: 202 mg/dL — ABNORMAL HIGH (ref 70–99)
Glucose-Capillary: 146 mg/dL — ABNORMAL HIGH (ref 70–99)
Glucose-Capillary: 147 mg/dL — ABNORMAL HIGH (ref 70–99)

## 2021-07-13 MED ORDER — METOPROLOL TARTRATE 50 MG PO TABS
50.0000 mg | ORAL_TABLET | Freq: Every day | ORAL | Status: DC
  Administered 2021-07-15 – 2021-07-17 (×3): 50 mg via ORAL
  Filled 2021-07-13 (×3): qty 1

## 2021-07-13 MED ORDER — HYDROCODONE-ACETAMINOPHEN 5-325 MG PO TABS
1.0000 | ORAL_TABLET | Freq: Three times a day (TID) | ORAL | Status: DC | PRN
  Administered 2021-07-13: 1 via ORAL
  Administered 2021-07-14 – 2021-07-16 (×4): 2 via ORAL
  Filled 2021-07-13 (×2): qty 2
  Filled 2021-07-13: qty 1
  Filled 2021-07-13 (×3): qty 2

## 2021-07-13 MED ORDER — SODIUM CHLORIDE 0.9 % IV SOLN
2.0000 g | Freq: Three times a day (TID) | INTRAVENOUS | Status: DC
  Administered 2021-07-13 – 2021-07-17 (×11): 2 g via INTRAVENOUS
  Filled 2021-07-13 (×11): qty 2

## 2021-07-13 MED ORDER — INSULIN GLARGINE 100 UNIT/ML ~~LOC~~ SOLN
22.0000 [IU] | Freq: Every day | SUBCUTANEOUS | Status: DC
  Administered 2021-07-13 – 2021-07-15 (×3): 22 [IU] via SUBCUTANEOUS
  Filled 2021-07-13 (×5): qty 0.22

## 2021-07-13 MED ORDER — HYDROMORPHONE HCL 1 MG/ML IJ SOLN
0.5000 mg | Freq: Once | INTRAMUSCULAR | Status: AC
  Administered 2021-07-13: 0.5 mg via INTRAVENOUS
  Filled 2021-07-13: qty 0.5

## 2021-07-13 MED ORDER — HYDROMORPHONE HCL 1 MG/ML IJ SOLN
0.5000 mg | Freq: Four times a day (QID) | INTRAMUSCULAR | Status: DC | PRN
  Administered 2021-07-14 – 2021-07-15 (×3): 0.5 mg via INTRAVENOUS
  Filled 2021-07-13: qty 1
  Filled 2021-07-13: qty 0.5
  Filled 2021-07-13: qty 1

## 2021-07-13 NOTE — Progress Notes (Addendum)
Subjective: Overnight pressures were soft, asymptomatic.  This morning patient states he feels drenched.  Does not feel warm or think that the room is warm.  Denies any chest pain, shortness of breath, nausea, vomiting or abdominal pain.  States his foot pain is worse if anyone touches it or messes with the foot.  Objective:  Vital signs in last 24 hours: Vitals:   07/12/21 1555 07/12/21 2032 07/13/21 0443 07/13/21 0759  BP: (!) 126/51 95/64 (!) 118/48 (!) 118/48  Pulse: 74 79 93 (!) 109  Resp: 19 17 17 16   Temp: 98.1 F (36.7 C) 98.1 F (36.7 C) 98.3 F (36.8 C) 98.2 F (36.8 C)  TempSrc: Oral Oral Oral Oral  SpO2: 97% 98% 95% 98%  Weight:      Height:       Physical Exam General: alert, appears stated age, in no acute distress, in good spirits HEENT: Normocephalic, atraumatic, EOM intact, conjunctiva normal CV: Regular rate and rhythm, no murmurs rubs or gallops Pulm: Clear to auscultation bilaterally, normal work of breathing Abdomen: Soft, nondistended, bowel sounds present, no tenderness to palpation MSK: Erythema swelling and redness of left forefoot.  Fourth toe has been surgically removed and there is serous drainage from surgical site.  Third toe appears dark.  Decreased sensation along the left forefoot Skin: Erythema and warmth of the left forefoot Neuro: Alert and oriented x3  Assessment/Plan:  Active Problems:   DM (diabetes mellitus) type II, controlled, with peripheral vascular disorder (HCC)   PAD (peripheral artery disease) (HCC)   Osteomyelitis (HCC)  [name] is a 75 year old male with a past medical history of type 2 diabetes mellitus, PVD, hypertension, CAD, and prior left foot fourth digit amputation presents with foot pain and admitted for treatment of osteomyelitis of his left foot.   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  Plan for angiogram tomorrow with vascular surgery with possible revascularization.  Continuing IV antibiotics.  He remains afebrile and hemodynamically stable.  States his pain is still uncontrolled, pain meds are wearing off prior to next dose.  Will adjust pain regimen. -Vascular on board, appreciate recommendations -Continue vancomycin, cefepime and metronidazole -Continue oxycodone-acetaminophen 5-3 25 1  to 2 tablets every 8 hours for moderate pain and IV Dilaudid 0.5 mg every 6 hours as needed severe pain -Continue aspirin and Plavix -Follow blood cultures -Trend WBC  COVID-positive Asymptomatic.  Incidental finding on admission.  Patient reports testing positive back in June, unable to see these results patient denies any signs or symptoms of COVID infection.  RN contacted infection prevention to determine if patient needs airborne precautions.  Type 2 Diabetes Myelitis Home medications include Ozempic weekly, metformin 1000 mg twice daily and NovoLog Mix 70/30 30 units twice daily.  Patient will need good glucose control in the setting of infection and pending surgical amputation.  Blood glucose remains elevated will increase Lantus to 22 units. - Lantus 22 units daily, uptitrate as needed - SSI   Anxiety Depression Will stop citalopram short-term to prevent risk of serotonin syndrome.   -Continue home medications trazodone and duloxetine daily   History of Myocardial Infarction Hyperlipidemia Home medication metoprolol 50 mg twice daily held overnight due to soft pressures.  Will decrease to once daily. - Decrease home dose of metoprolol 50 mg to once daily, can resume twice daily if pressures trend up -  Atorvastatin 40 mg daily - Aspirin 81 monitor daily  Edema Patient is on furosemide 40 mg daily I presume for lower extremity edema.  Will hold in the setting of normotensive blood pressures  Prior to Admission Living Arrangement:  Home Anticipated Discharge Location: SNF Barriers to Discharge: Medical workup/surgery  Dispo: Anticipated discharge pending arteriogram and surgical planning.  Adelma Bowdoin N, DO 07/13/2021, 11:23 AM Pager: [phone] After 5pm on weekdays and 1pm on weekends: On Call pager [phone]

## 2021-07-13 NOTE — Progress Notes (Signed)
PHARMACY NOTE:  ANTIMICROBIAL RENAL DOSAGE ADJUSTMENT  Current antimicrobial regimen includes a mismatch between antimicrobial dosage and estimated renal function.  As per policy approved by the Pharmacy & Therapeutics and Medical Executive Committees, the antimicrobial dosage will be adjusted accordingly.  Current antimicrobial dosage:  cefepime 2g q 12h  Indication: osteomyelitis L foot  Renal function:  Estimated Creatinine Clearance: 61.3 mL/min (by C-G formula based on SCr of 1.2 mg/dL). []      On intermittent HD, scheduled: []      On CRRT    Antimicrobial dosage has been changed to:  cefepime 2g q 8h   Thank you for allowing pharmacy to be a part of this patient's care.  , [facility] 07/13/2021 1:01 PM

## 2021-07-13 NOTE — Progress Notes (Signed)
   NPO past MN for angiogram with possible left LE intervention   Mosetta Pigeon PA-C

## 2021-07-13 NOTE — Plan of Care (Signed)

## 2021-07-13 NOTE — Progress Notes (Signed)
[facility] [place] PROGRESS NOTE  ASSESSMENT / PLAN: [name] is a 75 y.o. male with atherosclerosis of native arteries of left lower extremity causing ulceration and infection.  Patient counseled patients with chronic limb threatening ischemia have an annual risk of cardiovascular mortality of 25% and a annual amputation risk of 25%. Aggressive risk factor modification and intervention for limb salvage is indicated..  Recommend the following which can slow the progression of atherosclerosis and reduce the risk of major adverse cardiac / limb events:  Complete cessation from all tobacco products. Blood glucose control with goal A1c < 7%. Blood pressure control with goal blood pressure < 140/90 mmHg. Lipid reduction therapy with goal LDL-C <100 mg/dL (<10 if symptomatic from PAD).  Aspirin 81mg  PO QD.  Atorvastatin 40-80mg  PO QD (or other "high intensity" statin therapy).  Plan left lower extremity angiogram with possible intervention via right common femoral artery approach in cath lab 07/14/21. TMA vs. BKA depending on angiogram results.  SUBJECTIVE: Left foot about the same. Discussed angiogram tomorrow. Discussed chances for limb salvage.  OBJECTIVE: BP (!) 118/48 (BP Location: Right Arm)   Pulse (!) 109   Temp 98.2 F (36.8 C) (Oral)   Resp 16   Ht 5\' 8"  (1.727 m)   Wt 94.9 kg   SpO2 98%   BMI 31.81 kg/m   Intake/Output Summary (Last 24 hours) at 07/13/2021 Last data filed at 07/13/2021 0725 Gross per 24 hour  Intake 500 ml  Output 400 ml  Net 100 ml    Constitutional: Well appearing. No acute distress. Cardiac: RRR. Pulmonary: unlabored Abdomen: obese, soft Vascular: palpable femoral pulses  No palpable pedal pulses  Unchanged appearance of L forefoot       CBC Latest Ref Rng & Units 07/12/2021 07/10/2021 07/09/2021  WBC 4.0 - 10.5 K/uL 10.1 13.3(H) 11.3(H)  Hemoglobin 13.0 - 17.0 g/dL 11.6(L) 12.7(L) 12.8(L)  Hematocrit  39.0 - 52.0 % 34.7(L) 39.4 40.2  Platelets 150 - 400 K/uL 272 294 301     CMP Latest Ref Rng & Units 07/12/2021 07/10/2021 07/09/2021  Glucose 70 - 99 mg/dL 07/12/2021) 07/11/2021) 277(O)  BUN 8 - 23 mg/dL 17 21 21   Creatinine 0.61 - 1.24 mg/dL 242(P 536(R) )  Sodium 135 - 145 mmol/L 134(L) 133(L) 138  Potassium 3.5 - 5.1 mmol/L 4.8 4.7 4.4  Chloride 98 - 111 mmol/L 101 99 102  CO2 22 - 32 mmol/L 26 28 27   Calcium 8.9 - 10.3 mg/dL 8.9 9.5 9.5  Total Protein 6.5 - 8.1 g/dL 6.3(L) 7.0 7.3  Total Bilirubin 0.3 - 1.2 mg/dL 1.0 1.0 0.8  Alkaline Phos 38 - 126 U/L 78 85 85  AST 15 - 41 U/L 21 32 36  ALT 0 - 44 U/L 24 29 32    Estimated Creatinine Clearance: 61.3 mL/min (by C-G formula based on SCr of 1.2 mg/dL).  4.43. 1.54(M, MD [facility] Phone Number: [phone] 07/13/2021 8:08 AM

## 2021-07-14 ENCOUNTER — Inpatient Hospital Stay (HOSPITAL_COMMUNITY)
Admission: EM | Disposition: A | Discharge status: Home Health | Payer: Self-pay | Source: Home / Self Care | Attending: Internal Medicine

## 2021-07-14 DIAGNOSIS — M86172 Other acute osteomyelitis, left ankle and foot: Secondary | ICD-10-CM | POA: Diagnosis not present

## 2021-07-14 DIAGNOSIS — E1151 Type 2 diabetes mellitus with diabetic peripheral angiopathy without gangrene: Secondary | ICD-10-CM | POA: Diagnosis not present

## 2021-07-14 DIAGNOSIS — M868X7 Other osteomyelitis, ankle and foot: Secondary | ICD-10-CM

## 2021-07-14 DIAGNOSIS — I70222 Atherosclerosis of native arteries of extremities with rest pain, left leg: Secondary | ICD-10-CM

## 2021-07-14 DIAGNOSIS — L089 Local infection of the skin and subcutaneous tissue, unspecified: Secondary | ICD-10-CM | POA: Diagnosis not present

## 2021-07-14 HISTORY — PX: ABDOMINAL AORTOGRAM W/LOWER EXTREMITY: CATH118223

## 2021-07-14 LAB — CBC WITH DIFFERENTIAL/PLATELET
Abs Immature Granulocytes: 0.04 10*3/uL (ref 0.00–0.07)
Basophils Absolute: 0 10*3/uL (ref 0.0–0.1)
Basophils Relative: 0 %
Eosinophils Absolute: 0.1 10*3/uL (ref 0.0–0.5)
Eosinophils Relative: 1 %
HCT: 40.1 % (ref 39.0–52.0)
Hemoglobin: 12.9 g/dL — ABNORMAL LOW (ref 13.0–17.0)
Immature Granulocytes: 0 %
Lymphocytes Relative: 12 %
Lymphs Abs: 1.3 10*3/uL (ref 0.7–4.0)
MCH: 31.2 pg (ref 26.0–34.0)
MCHC: 32.2 g/dL (ref 30.0–36.0)
MCV: 96.9 fL (ref 80.0–100.0)
Monocytes Absolute: 1.1 10*3/uL — ABNORMAL HIGH (ref 0.1–1.0)
Monocytes Relative: 10 %
Neutro Abs: 8.3 10*3/uL — ABNORMAL HIGH (ref 1.7–7.7)
Neutrophils Relative %: 77 %
Platelets: 312 10*3/uL (ref 150–400)
RBC: 4.14 MIL/uL — ABNORMAL LOW (ref 4.22–5.81)
RDW: 13.3 % (ref 11.5–15.5)
WBC: 10.9 10*3/uL — ABNORMAL HIGH (ref 4.0–10.5)
nRBC: 0 % (ref 0.0–0.2)

## 2021-07-14 LAB — BASIC METABOLIC PANEL
Anion gap: 11 (ref 5–15)
BUN: 15 mg/dL (ref 8–23)
CO2: 23 mmol/L (ref 22–32)
Calcium: 10 mg/dL (ref 8.9–10.3)
Chloride: 101 mmol/L (ref 98–111)
Creatinine, Ser: 1.1 mg/dL (ref 0.61–1.24)
GFR, Estimated: >60 mL/min (ref >60)
Glucose, Bld: 126 mg/dL — ABNORMAL HIGH (ref 70–99)
Potassium: 4.4 mmol/L (ref 3.5–5.1)
Sodium: 135 mmol/L (ref 135–145)

## 2021-07-14 LAB — GLUCOSE, CAPILLARY
Glucose-Capillary: 107 mg/dL — ABNORMAL HIGH (ref 70–99)
Glucose-Capillary: 111 mg/dL — ABNORMAL HIGH (ref 70–99)
Glucose-Capillary: 200 mg/dL — ABNORMAL HIGH (ref 70–99)
Glucose-Capillary: 261 mg/dL — ABNORMAL HIGH (ref 70–99)

## 2021-07-14 LAB — CULTURE, BLOOD (ROUTINE X 2)
Culture: NO GROWTH
Culture: NO GROWTH
Special Requests: ADEQUATE

## 2021-07-14 SURGERY — ABDOMINAL AORTOGRAM W/LOWER EXTREMITY
Anesthesia: LOCAL

## 2021-07-14 MED ORDER — ACETAMINOPHEN 325 MG PO TABS
650.0000 mg | ORAL_TABLET | Freq: Four times a day (QID) | ORAL | Status: DC | PRN
  Administered 2021-07-15: 650 mg via ORAL
  Filled 2021-07-14: qty 2

## 2021-07-14 MED ORDER — MIDAZOLAM HCL 2 MG/2ML IJ SOLN
INTRAMUSCULAR | Status: DC | PRN
  Administered 2021-07-14: 1 mg via INTRAVENOUS

## 2021-07-14 MED ORDER — SODIUM CHLORIDE 0.9 % IV SOLN: 250.0000 mL | INTRAVENOUS | Status: DC | PRN

## 2021-07-14 MED ORDER — ACETAMINOPHEN 325 MG PO TABS
650.0000 mg | ORAL_TABLET | ORAL | Status: DC | PRN
  Administered 2021-07-14: 650 mg via ORAL
  Filled 2021-07-14: qty 2

## 2021-07-14 MED ORDER — SODIUM CHLORIDE 0.9% FLUSH: 3.0000 mL | INTRAVENOUS | Status: DC | PRN

## 2021-07-14 MED ORDER — HYDRALAZINE HCL 20 MG/ML IJ SOLN
5.0000 mg | INTRAMUSCULAR | Status: DC | PRN
Start: 2021-07-14 — End: 2021-07-17

## 2021-07-14 MED ORDER — MIDAZOLAM HCL 2 MG/2ML IJ SOLN
INTRAMUSCULAR | Status: AC
  Filled 2021-07-14: qty 2

## 2021-07-14 MED ORDER — ONDANSETRON HCL 4 MG/2ML IJ SOLN: 4.0000 mg | Freq: Four times a day (QID) | INTRAMUSCULAR | Status: DC | PRN

## 2021-07-14 MED ORDER — FENTANYL CITRATE (PF) 100 MCG/2ML IJ SOLN
INTRAMUSCULAR | Status: DC | PRN
  Administered 2021-07-14: 25 ug via INTRAVENOUS

## 2021-07-14 MED ORDER — SODIUM CHLORIDE 0.9 % IV SOLN: INTRAVENOUS | Status: DC

## 2021-07-14 MED ORDER — IODIXANOL 320 MG/ML IV SOLN
INTRAVENOUS | Status: DC | PRN
  Administered 2021-07-14: 100 mL via INTRA_ARTERIAL

## 2021-07-14 MED ORDER — FENTANYL CITRATE (PF) 100 MCG/2ML IJ SOLN
INTRAMUSCULAR | Status: AC
  Filled 2021-07-14: qty 2

## 2021-07-14 MED ORDER — LIDOCAINE HCL (PF) 1 % IJ SOLN
INTRAMUSCULAR | Status: AC
  Filled 2021-07-14: qty 30

## 2021-07-14 MED ORDER — SODIUM CHLORIDE 0.9 % IV SOLN: INTRAVENOUS | Status: AC

## 2021-07-14 MED ORDER — LABETALOL HCL 5 MG/ML IV SOLN: 10.0000 mg | INTRAVENOUS | Status: DC | PRN

## 2021-07-14 MED ORDER — LIDOCAINE HCL (PF) 1 % IJ SOLN
INTRAMUSCULAR | Status: DC | PRN
  Administered 2021-07-14: 15 mL via INTRADERMAL

## 2021-07-14 MED ORDER — ACETAMINOPHEN 650 MG RE SUPP: 650.0000 mg | Freq: Four times a day (QID) | RECTAL | Status: DC | PRN

## 2021-07-14 MED ORDER — HEPARIN (PORCINE) IN NACL 1000-0.9 UT/500ML-% IV SOLN
INTRAVENOUS | Status: DC | PRN
  Administered 2021-07-14 (×2): 500 mL

## 2021-07-14 MED ORDER — SODIUM CHLORIDE 0.9% FLUSH
3.0000 mL | Freq: Two times a day (BID) | INTRAVENOUS | Status: DC
  Administered 2021-07-14 – 2021-07-16 (×4): 3 mL via INTRAVENOUS

## 2021-07-14 MED ORDER — HEPARIN (PORCINE) IN NACL 1000-0.9 UT/500ML-% IV SOLN
INTRAVENOUS | Status: AC
  Filled 2021-07-14: qty 1000

## 2021-07-14 SURGICAL SUPPLY — 12 items
CATH OMNI FLUSH 5F 65CM (CATHETERS) ×2 IMPLANT
CATH TEMPO AQUA 5F 100CM (CATHETERS) ×2 IMPLANT
CLOSURE MYNX CONTROL 5F (Vascular Products) ×2 IMPLANT
GLIDEWIRE ANGLED SS 035X260CM (WIRE) ×2 IMPLANT
KIT MICROPUNCTURE NIT STIFF (SHEATH) ×2 IMPLANT
KIT PV (KITS) ×2 IMPLANT
SHEATH PINNACLE 5F 10CM (SHEATH) ×2 IMPLANT
SHEATH PROBE COVER 6X72 (BAG) ×2 IMPLANT
SYR MEDRAD MARK V 150ML (SYRINGE) ×2 IMPLANT
TRANSDUCER W/STOPCOCK (MISCELLANEOUS) ×2 IMPLANT
TRAY PV CATH (CUSTOM PROCEDURE TRAY) ×2 IMPLANT
WIRE BENTSON .035X145CM (WIRE) ×2 IMPLANT

## 2021-07-14 NOTE — Progress Notes (Signed)
Subjective:  Overnight: No acute events or concerns overnight.  Patient seen and examined on rounds. He endorses fluctuating pain in his foot. It worsens with movement or touching the foot. His pain is slightly better managed with the pain medication changes we made yesterday. Discussed plan for angiogram today which will further guide surgical management.   Objective:  Vital signs in last 24 hours: Vitals:   07/13/21 1358 07/13/21 2057 07/14/21 0043 07/14/21 0456  BP: 97/71 (!) 122/91 117/67 (!) 132/52  Pulse: 80 89 97 (!) 109  Resp: 18 18 18 20   Temp: (!) 97.5 F (36.4 C) 98.4 F (36.9 C) 97.9 F (36.6 C) (!) 97.5 F (36.4 C)  TempSrc: Oral Oral Oral Oral  SpO2: 96% 98% 96% 97%  Weight:      Height:       Intake/Output Summary (Last 24 hours) at 07/14/2021 0631 Last data filed at 07/13/2021 1200 Gross per 24 hour  Intake 360 ml  Output --  Net 360 ml    Physical Exam General: alert, appears stated age, in no acute distress HEENT: Normocephalic, atraumatic, conjunctiva normal CV: Regular rate and rhythm, no murmurs rubs or gallops Pulm: Clear to auscultation bilaterally, normal work of breathing on RA Abdomen: Soft, nondistended, bowel sounds present, no tenderness to palpation MSK: Erythema swelling and redness of left forefoot.  Fourth toe has been surgically removed and there is serous drainage from surgical site.  Third toe appears dark.  Decreased sensation along the left forefoot Skin: Erythema and warmth of the left forefoot Neuro: Alert and oriented x3  Assessment/Plan:  Active Problems:   DM (diabetes mellitus) type II, controlled, with peripheral vascular disorder (HCC)   PAD (peripheral artery disease) (HCC)   Osteomyelitis (HCC)  [name] is a 75 year old male with a past medical history of type 2 diabetes mellitus, PVD, hypertension, CAD, and prior left foot fourth digit amputation who presented with foot pain and was admitted for treatment  of osteomyelitis of his left foot on hospital day 3    Osteomyelitis of the Left Foot with Ischemic Changes in a Patient with Type 2 Diabetes Mellitus PAD/PVD Patient presents with progression of his left forefoot ischemia and infection.  Plan for angiogram 7/25 with vascular surgery with possible revascularization.  Continuing IV antibiotics.  He remains afebrile and hemodynamically stable.   -Vascular on board, appreciate recommendations -Angiogram today -Continue vancomycin, cefepime and metronidazole -Continue oxycodone-acetaminophen 5-3 25 1  to 2 tablets every 8 hours for moderate pain and IV Dilaudid 0.5 mg every 6 hours as needed severe pain -Continue aspirin and Plavix -Follow blood cultures -Trend WBC  COVID-positive Asymptomatic.  Incidental finding on admission.  Patient reports testing positive back in June, unable to see these results patient denies any signs or symptoms of COVID infection. No longer needs to be on isolation.    Type 2 Diabetes Myelitis Home medications include Ozempic weekly, metformin 1000 mg twice daily and NovoLog Mix 70/30 30 units twice daily.  Patient will need good glucose control in the setting of infection and pending surgical amputation.  - Lantus 22 units daily, uptitrate as needed - SSI   Anxiety Depression Holding citalopram short-term to prevent risk of serotonin syndrome.   -Continue home medications trazodone and duloxetine daily   History of Myocardial Infarction Hyperlipidemia Home medication metoprolol 50 mg twice daily held overnight due to soft pressures.   - Decreased home dose of metoprolol 50 mg once daily, can resume twice daily if  pressures trend up - Atorvastatin 40 mg daily - Aspirin 81 monitor daily  Edema Patient is on furosemide 40 mg daily I presume for lower extremity edema. Holding in the setting of normotensive blood pressures  Prior to Admission Living Arrangement: Home Anticipated Discharge Location:  SNF Barriers to Discharge: Medical workup/surgery  Dispo: Anticipated discharge pending arteriogram and surgical planning.  Ellison Carwin, MD 07/14/2021, 6:31 AM Pager: [phone] After 5pm on weekdays and 1pm on weekends: On Call pager [phone]

## 2021-07-14 NOTE — Progress Notes (Signed)
Pt arrived to unit from cath lab VSS, A/O x 4,  CCMD called  pt oriented to unit, right groin level 0 Will continue to monitor.   Karna Christmas Satoshi Kalas, RN    07/14/21 1613  Vitals  Temp 98.5 F (36.9 C)  Temp Source Oral  BP (!) 114/52  MAP (mmHg) 71  BP Location Right Arm  BP Method Automatic  Patient Position (if appropriate) Lying  Pulse Rate 97  Pulse Rate Source Monitor  ECG Heart Rate 97  Resp 20  Level of Consciousness  Level of Consciousness Alert  Oxygen Therapy  SpO2 99 % (99)  O2 Device Room Air  O2 Flow Rate (L/min) 0 L/min  Pain Assessment  Pain Scale 0-10  Pain Score 3

## 2021-07-14 NOTE — Progress Notes (Signed)
According to the recommendations of the the IP on call, patient was positive for Covid in June. Retesting within 90 days of a positive without suspicion of new covid disease is not recommended as a positive result does not reflect new infection. No isolation needed in this case. Please review [employer] for policies and procedures or call infection prevention.

## 2021-07-14 NOTE — Op Note (Signed)
    Patient name: [name] MRN: [id number] DOB: 04-15-1946 Sex: male  07/14/2021 Pre-operative Diagnosis: Critical left lower extremity ischemia with osteomyelitis left fourth and fifth metatarsal Post-operative diagnosis:  Same Surgeon:  Luanna Salk. Randie Heinz, MD Procedure Performed: 1.  Ultrasound-guided cannulation right common femoral artery 2.  Aortogram 3.  Selection of left common femoral artery and left popliteal artery and left lower extremity angiography 4.  Mynx device closure right common femoral artery 5.  Moderate sedation with fentanyl and Versed for 26 minutes  Indications: 75 year old male previous undergone left fourth toe amputation now has osteomyelitis at the head of the fourth metatarsal and third metatarsal and toe.  He is indicated for angiography with possible invention.  Findings: Aorta and iliac segments are free of flow-limiting stenosis.  Bilateral renal arteries are patent with no stenosis.  Left common femoral and SFA are patent.  Popliteal artery is patent without stenosis.  Below the knee he has severe disease.  Posterior tibial is inline to the foot however is quite a diminutive vessel.  The peroneal artery occludes has multiple areas of occlusion and reconstitution but does not go past the ankle.  The anterior tibial artery has multiple greater than 50% stenosis however it also gives out at the level of the ankle and no intervention was undertaken.  Patient will need best medical therapy and left transmetatarsal amputation.  He is high risk for transtibial amputation.   Procedure:  The patient was identified in the holding area and taken to room 8.  The patient was then placed supine on the table and prepped and draped in the usual sterile fashion.  A time out was called.  Ultrasound was used to evaluate the right common femoral artery.  This artery appeared healthy although with circumferential calcification.  There is no spasm present Liken cannulated  micropuncture needle followed by wire and sheath.  Bentson wires placed followed by 5 [place] sheath.  This was done with ultrasound guidance and images saved the permanent record.  Omni catheter was placed to the level of L1 aortogram was performed.  We crossed the bifurcation left common femoral artery perform left lower extremity angiography.  We placed a Glidewire and a straight catheter to the level of the knee and performed below the knee angiography with the above findings.  We elected not to intervene.  The catheter was removed.  A minx device was deployed.  He tolerated seizure without any complication.  Contrast: 100 cc   Asher Torpey C. Randie Heinz, MD [facility] [place] Office: [phone] Pager: [phone]

## 2021-07-14 NOTE — Progress Notes (Signed)
  Progress Note    07/14/2021 8:44 AM * No surgery date entered *  Subjective:  no overnight issues  Vitals:   07/14/21 0456 07/14/21 0738  BP: (!) 132/52 (!) 127/38  Pulse: (!) 109 85  Resp: 20 16  Temp: (!) 97.5 F (36.4 C) 97.9 F (36.6 C)  SpO2: 97% 96%    Physical Exam: Awake alert oriented On the respirations Left foot dressing clean dry intact  CBC    Component Value Date/Time   WBC 10.9 (H) 07/14/2021 0034   RBC 4.14 (L) 07/14/2021 0034   HGB 12.9 (L) 07/14/2021 0034   HCT 40.1 07/14/2021 0034   PLT 312 07/14/2021 0034   MCV 96.9 07/14/2021 0034   MCH 31.2 07/14/2021 0034   MCHC 32.2 07/14/2021 0034   RDW 13.3 07/14/2021 0034   LYMPHSABS 1.3 07/14/2021 0034   MONOABS 1.1 (H) 07/14/2021 0034   EOSABS 0.1 07/14/2021 0034   BASOSABS 0.0 07/14/2021 0034    BMET    Component Value Date/Time   NA 135 07/14/2021 0034   K 4.4 07/14/2021 0034   CL 101 07/14/2021 0034   CO2 23 07/14/2021 0034   GLUCOSE 126 (H) 07/14/2021 0034   BUN 15 07/14/2021 0034   CREATININE 1.10 07/14/2021 0034   CALCIUM 10.0 07/14/2021 0034   GFRNONAA >60 07/14/2021 0034   GFRAA 88 08/30/2008 1249    INR    Component Value Date/Time   INR 1.4 09/22/2007 1200     Intake/Output Summary (Last 24 hours) at 07/14/2021 0844 Last data filed at 07/14/2021 0659 Gross per 24 hour  Intake 720 ml  Output 1 ml  Net 719 ml     Assessment/plan:  75 y.o. male is admitted with osteomyelitis base of left third and fourth toes.  Plan is for angiography today possibly will need second and third toe amputation transmetatarsal amputation later this week.   Sears Oran C. Randie Heinz, MD [facility] [place] Office: [phone] Pager: [phone]  07/14/2021 8:44 AM

## 2021-07-15 ENCOUNTER — Encounter (HOSPITAL_COMMUNITY): Payer: Self-pay | Admitting: Vascular Surgery

## 2021-07-15 DIAGNOSIS — E1151 Type 2 diabetes mellitus with diabetic peripheral angiopathy without gangrene: Secondary | ICD-10-CM | POA: Diagnosis not present

## 2021-07-15 DIAGNOSIS — I739 Peripheral vascular disease, unspecified: Secondary | ICD-10-CM | POA: Diagnosis not present

## 2021-07-15 DIAGNOSIS — M86172 Other acute osteomyelitis, left ankle and foot: Secondary | ICD-10-CM | POA: Diagnosis not present

## 2021-07-15 LAB — BASIC METABOLIC PANEL
Anion gap: 7 (ref 5–15)
BUN: 12 mg/dL (ref 8–23)
CO2: 25 mmol/L (ref 22–32)
Calcium: 9.3 mg/dL (ref 8.9–10.3)
Chloride: 104 mmol/L (ref 98–111)
Creatinine, Ser: 0.92 mg/dL (ref 0.61–1.24)
GFR, Estimated: >60 mL/min (ref >60)
Glucose, Bld: 168 mg/dL — ABNORMAL HIGH (ref 70–99)
Potassium: 4.2 mmol/L (ref 3.5–5.1)
Sodium: 136 mmol/L (ref 135–145)

## 2021-07-15 LAB — GLUCOSE, CAPILLARY
Glucose-Capillary: 133 mg/dL — ABNORMAL HIGH (ref 70–99)
Glucose-Capillary: 224 mg/dL — ABNORMAL HIGH (ref 70–99)
Glucose-Capillary: 208 mg/dL — ABNORMAL HIGH (ref 70–99)
Glucose-Capillary: 176 mg/dL — ABNORMAL HIGH (ref 70–99)

## 2021-07-15 LAB — CBC
HCT: 35 % — ABNORMAL LOW (ref 39.0–52.0)
Hemoglobin: 11.5 g/dL — ABNORMAL LOW (ref 13.0–17.0)
MCH: 31.3 pg (ref 26.0–34.0)
MCHC: 32.9 g/dL (ref 30.0–36.0)
MCV: 95.4 fL (ref 80.0–100.0)
Platelets: 267 10*3/uL (ref 150–400)
RBC: 3.67 MIL/uL — ABNORMAL LOW (ref 4.22–5.81)
RDW: 13.4 % (ref 11.5–15.5)
WBC: 8.4 10*3/uL (ref 4.0–10.5)
nRBC: 0 % (ref 0.0–0.2)

## 2021-07-15 MED FILL — Heparin Sodium (Porcine) Inj 1000 Unit/ML: INTRAMUSCULAR | Qty: 10 | Status: AC

## 2021-07-15 MED FILL — Verapamil HCl IV Soln 2.5 MG/ML: INTRAVENOUS | Qty: 2 | Status: AC

## 2021-07-15 NOTE — Progress Notes (Addendum)
  Progress Note    07/15/2021 7:05 AM   Subjective:  No complaints except room temp is too hot   Vitals:   07/14/21 2315 07/15/21 0320  BP: 119/61 (!) 112/51  Pulse: 88 94  Resp: (!) 22 18  Temp: 98.3 F (36.8 C) 98.1 F (36.7 C)  SpO2: 93% 96%    Physical Exam: General appearance: Awake, alert in no apparent distress Cardiac: Heart rate and rhythm are regular Respirations: Nonlabored Incisions: Right groin puncture site soft without bleeding or hematoma Extremities: Both feet are warm with intact sensation and motor function.  Ischemic changes noted and unchanged.   CBC    Component Value Date/Time   WBC 8.4 07/15/2021 0159   RBC 3.67 (L) 07/15/2021 0159   HGB 11.5 (L) 07/15/2021 0159   HCT 35.0 (L) 07/15/2021 0159   PLT 267 07/15/2021 0159   MCV 95.4 07/15/2021 0159   MCH 31.3 07/15/2021 0159   MCHC 32.9 07/15/2021 0159   RDW 13.4 07/15/2021 0159   LYMPHSABS 1.3 07/14/2021 0034   MONOABS 1.1 (H) 07/14/2021 0034   EOSABS 0.1 07/14/2021 0034   BASOSABS 0.0 07/14/2021 0034    BMET    Component Value Date/Time   NA 136 07/15/2021 0159   K 4.2 07/15/2021 0159   CL 104 07/15/2021 0159   CO2 25 07/15/2021 0159   GLUCOSE 168 (H) 07/15/2021 0159   BUN 12 07/15/2021 0159   CREATININE 0.92 07/15/2021 0159   CALCIUM 9.3 07/15/2021 0159   GFRNONAA >60 07/15/2021 0159   GFRAA 88 08/30/2008 1249     Intake/Output Summary (Last 24 hours) at 07/15/2021 0705 Last data filed at 07/15/2021 0523 Gross per 24 hour  Intake 1230.49 ml  Output --  Net 1230.49 ml    HOSPITAL MEDICATIONS Scheduled Meds:  atorvastatin  40 mg Oral q AM   calcium carbonate  1 tablet Oral TID   clopidogrel  75 mg Oral q AM   DULoxetine  30 mg Oral Daily   enoxaparin (LOVENOX) injection  40 mg Subcutaneous Q24H   insulin aspart  0-15 Units Subcutaneous TID WC   insulin glargine  22 Units Subcutaneous QHS   metoprolol tartrate  50 mg Oral Daily   pantoprazole  40 mg Oral Daily    sodium chloride flush  3 mL Intravenous Q12H   traZODone  25 mg Oral QHS   Continuous Infusions:  sodium chloride     ceFEPime (MAXIPIME) IV 2 g (07/15/21 0523)   metronidazole 500 mg (07/15/21 0604)   vancomycin Stopped (07/13/21 1302)   PRN Meds:.sodium chloride, acetaminophen **OR** acetaminophen, acetaminophen, hydrALAZINE, HYDROcodone-acetaminophen, HYDROmorphone (DILAUDID) injection, labetalol, nitroGLYCERIN, ondansetron (ZOFRAN) IV, senna-docusate, sodium chloride flush  Assessment and Plan: 76 y.o. male with atherosclerosis of native arteries of left lower extremity causing ulceration and infection; osteomyelitis left fourth and fifth metatarsal. S/p arteriogram via right CFA>>no intervention. Remains afebrile. CBC stable. Continue antibiotics. Plan left TMA tomorrow. Pre-op orders placed.  -DVT prophylaxis:  Lovenox   Wendi Maya, PA-C [facility] [phone] 07/15/2021  7:05 AM   I have independently interviewed and examined patient and agree with PA assessment and plan above.   Ruchama Kubicek C. Randie Heinz, MD [facility] [place] Office: [phone] Pager: [phone]

## 2021-07-15 NOTE — Progress Notes (Signed)
Pharmacy Antibiotic Note  [name] is a 75 y.o. male admitted on 07/10/2021 with foot infection. Patient with a history of T2DM, hyperlipidemia, MI, PVD, and stroke. Patient presented with chief complaint of foot infection that has been ongoing since March of 2022. Patient is s/p amputation of the fourth digit of the left foot due to gangrene in March.  Pharmacy has been consulted for vancomycin dosing. Imaging significant for Erosion/osteomyelitis of the third and fourth metatarsal heads and the third proximal phalanx.  Plans for possible amputation later in the week. WBC 8.4  Plan: Cefepime 2g q8h Flagyl 500mg  IV q8h Vancomycin 1000 IV every 24 hours.  Goal trough 10-15 mcg/mL. Estimated AUC 490.5. Pharmacy to adjust dosing per renal function F/u vascular and ortho recommendations F/u cultures - de-escalate when appropriate  Height: 5\' 8"  (172.7 cm) Weight: 94.9 kg (209 lb 3.2 oz) IBW/kg (Calculated) : 68.4  Temp (24hrs), Avg:98.3 F (36.8 C), Min:98.1 F (36.7 C), Max:98.5 F (36.9 C)  Recent Labs  Lab 07/09/21 2027 07/10/21 1836 07/10/21 1912 07/12/21 0225 07/14/21 0034 07/15/21 0159  WBC 11.3* 13.3*  --  10.1 10.9* 8.4  CREATININE 1.27* 1.37*  --  1.20 1.10 0.92  LATICACIDVEN 2.3*  --  1.3  --   --   --      Estimated Creatinine Clearance: 79.9 mL/min (by C-G formula based on SCr of 0.92 mg/dL).    Allergies  Allergen Reactions   Ace Inhibitors     Heart attack    Lisinopril     Heart attack    Antimicrobials this admission: Zosyn x 1 7/22  Cefepime 7/22 >> Vancomycin 7/22 >>  Flagyl 7/22>>  Microbiology results: 7/22 Bld Cxs NGTD  Darron Stuck A. 8/22, PharmD, BCPS, FNKF Clinical Pharmacist [place] Please utilize Amion for appropriate phone number to reach the unit pharmacist [facility])

## 2021-07-15 NOTE — Plan of Care (Signed)

## 2021-07-15 NOTE — Progress Notes (Signed)
Subjective:  Overnight: No acute events or concerns overnight.  Patient seen and examined on rounds. Patient ambulated from bathroom to chair without assistance. His primary complaint is still L foot pain, pain regimen seems to manage his pain levels. He states procedure went well yesterday. He understands that vascular will do partial foot amputation tomorrow.  No discomfort at angiogram puncture site.  Objective:  Vital signs in last 24 hours: Vitals:   07/14/21 1613 07/14/21 1948 07/14/21 2315 07/15/21 0320  BP: (!) 114/52 (!) 115/43 119/61 (!) 112/51  Pulse: 97 92 88 94  Resp: 20 20 (!) 22 18  Temp: 98.5 F (36.9 C) 98.2 F (36.8 C) 98.3 F (36.8 C) 98.1 F (36.7 C)  TempSrc: Oral Oral Oral Oral  SpO2: 99% 91% 93% 96%  Weight:      Height:       Intake/Output Summary (Last 24 hours) at 07/15/2021 0749 Last data filed at 07/15/2021 0867 Gross per 24 hour  Intake 1230.49 ml  Output --  Net 1230.49 ml    Physical Exam General: alert, appears stated age, in no acute distress HEENT: Normocephalic, atraumatic, conjunctiva normal CV: Regular rate and rhythm, no murmurs rubs or gallops Pulm: Clear to auscultation bilaterally, normal work of breathing on RA Abdomen: Soft, nondistended, bowel sounds present, no tenderness to palpation MSK: Erythema swelling and redness of left forefoot.  Fourth toe has been surgically removed and there is serous drainage from surgical site.  Third toe appears dark.  Decreased sensation along the left forefoot Skin: Erythema and warmth of the left forefoot, Right groin puncture site without erythema or bleeding Neuro: Alert and oriented x3  Assessment/Plan:  Active Problems:   DM (diabetes mellitus) type II, controlled, with peripheral vascular disorder (HCC)   PAD (peripheral artery disease) (HCC)   Osteomyelitis (HCC)  [name] is a 75 year old male with a past medical history of type 2 diabetes mellitus, PVD, hypertension,  CAD, and prior left foot fourth digit amputation who presented with foot pain and was admitted for treatment of osteomyelitis of his left foot on hospital day 4    Osteomyelitis of the Left Foot with Ischemic Changes in a Patient with Type 2 Diabetes Mellitus PAD/PVD Patient presents with progression of his left forefoot ischemia and infection.  Plan for angiogram 7/25 with vascular surgery with possible revascularization.  Continuing IV antibiotics.  He remains afebrile and hemodynamically stable.   -Vascular on board, appreciate recommendations -L transmetatarsal amputation tomorrow with vascular surgery, NPO at midnight, holding lovenox -Continue vancomycin, cefepime and metronidazole -Continue oxycodone-acetaminophen 5-3 25 1  to 2 tablets every 8 hours for moderate pain and IV Dilaudid 0.5 mg every 6 hours as needed severe pain -Continue aspirin and Plavix   COVID-positive Asymptomatic.  Incidental finding on admission.  Patient reports testing positive back in June, unable to see these results patient denies any signs or symptoms of COVID infection. No longer needs to be on isolation.    Type 2 Diabetes Myelitis Home medications include Ozempic weekly, metformin 1000 mg twice daily and NovoLog Mix 70/30 30 units twice daily.  Patient will need good glucose control in the setting of infection and pending surgical amputation.  - Lantus 22 units daily, uptitrate as needed - SSI   Anxiety Depression Holding citalopram short-term to prevent risk of serotonin syndrome.   -Continue home medications trazodone and duloxetine daily   History of Myocardial Infarction Hyperlipidemia Home medication metoprolol 50 mg twice daily held overnight due to  soft pressures.   - Decreased home dose of metoprolol 50 mg once daily, can resume twice daily if pressures trend up - Atorvastatin 40 mg daily - Aspirin 81 monitor daily  Edema Patient is on furosemide 40 mg daily, presume for lower extremity  edema. Holding in the setting of normotensive blood pressures  Prior to Admission Living Arrangement: Home Anticipated Discharge Location: [facility] Barriers to Discharge: Medical workup/surgery  Dispo: Anticipated discharge pending surgery  Ellison Carwin, MD 07/15/2021, 7:49 AM Pager: [phone] After 5pm on weekdays and 1pm on weekends: On Call pager [phone]

## 2021-07-15 NOTE — Anesthesia Preprocedure Evaluation (Addendum)
Anesthesia Evaluation  Patient identified by MRN, date of birth, ID band Patient awake    Reviewed: Allergy & Precautions, NPO status , Patient's Chart, lab work & pertinent test results, reviewed documented beta blocker date and time   Airway Mallampati: III  TM Distance: >3 FB Neck ROM: Full    Dental  (+) Dental Advisory Given, Poor Dentition   Pulmonary shortness of breath and with exertion, sleep apnea , former smoker,  COVID + June 2022   Pulmonary exam normal breath sounds clear to auscultation       Cardiovascular hypertension, Pt. on medications and Pt. on home beta blockers + CAD, + Past MI (2005), + CABG (2008) and + Peripheral Vascular Disease (plavix)  Normal cardiovascular exam Rhythm:Regular Rate:Normal     Neuro/Psych PSYCHIATRIC DISORDERS Anxiety Depression CVA (2008)    GI/Hepatic negative GI ROS, Neg liver ROS,   Endo/Other  diabetes, Well Controlled, Type 2, Insulin Dependent, Oral Hypoglycemic AgentsLast a1c 7.2  Renal/GU negative Renal ROS  negative genitourinary   Musculoskeletal negative musculoskeletal ROS (+)   Abdominal (+) + obese,   Peds  Hematology  (+) Blood dyscrasia, anemia , hct 35   Anesthesia Other Findings LLE critical limb ischemia   Reproductive/Obstetrics negative OB ROS                            Anesthesia Physical Anesthesia Plan  ASA: 3  Anesthesia Plan: MAC and Regional   Post-op Pain Management:  Regional for Post-op pain   Induction:   PONV Risk Score and Plan: 2 and Propofol infusion and TIVA  Airway Management Planned: Natural Airway and Simple Face Mask  Additional Equipment: None  Intra-op Plan:   Post-operative Plan:   Informed Consent: I have reviewed the patients History and Physical, chart, labs and discussed the procedure including the risks, benefits and alternatives for the proposed anesthesia with the patient or  authorized representative who has indicated his/her understanding and acceptance.     Dental advisory given  Plan Discussed with: CRNA  Anesthesia Plan Comments:        Anesthesia Quick Evaluation

## 2021-07-16 ENCOUNTER — Encounter (HOSPITAL_COMMUNITY)
Admission: EM | Disposition: A | Discharge status: Home Health | Payer: Self-pay | Source: Home / Self Care | Attending: Internal Medicine

## 2021-07-16 ENCOUNTER — Inpatient Hospital Stay (HOSPITAL_COMMUNITY): Payer: No Typology Code available for payment source | Admitting: Anesthesiology

## 2021-07-16 DIAGNOSIS — M86172 Other acute osteomyelitis, left ankle and foot: Secondary | ICD-10-CM

## 2021-07-16 DIAGNOSIS — E1151 Type 2 diabetes mellitus with diabetic peripheral angiopathy without gangrene: Secondary | ICD-10-CM | POA: Diagnosis not present

## 2021-07-16 DIAGNOSIS — I739 Peripheral vascular disease, unspecified: Secondary | ICD-10-CM | POA: Diagnosis not present

## 2021-07-16 HISTORY — PX: TRANSMETATARSAL AMPUTATION: SHX6197

## 2021-07-16 LAB — CULTURE, BLOOD (ROUTINE X 2)
Culture: NO GROWTH
Culture: NO GROWTH
Special Requests: ADEQUATE
Special Requests: ADEQUATE

## 2021-07-16 LAB — GLUCOSE, CAPILLARY
Glucose-Capillary: 116 mg/dL — ABNORMAL HIGH (ref 70–99)
Glucose-Capillary: 122 mg/dL — ABNORMAL HIGH (ref 70–99)
Glucose-Capillary: 179 mg/dL — ABNORMAL HIGH (ref 70–99)
Glucose-Capillary: 322 mg/dL — ABNORMAL HIGH (ref 70–99)
Glucose-Capillary: 381 mg/dL — ABNORMAL HIGH (ref 70–99)
Glucose-Capillary: 423 mg/dL — ABNORMAL HIGH (ref 70–99)

## 2021-07-16 LAB — CBC
HCT: 35.1 % — ABNORMAL LOW (ref 39.0–52.0)
Hemoglobin: 11.5 g/dL — ABNORMAL LOW (ref 13.0–17.0)
MCH: 31.6 pg (ref 26.0–34.0)
MCHC: 32.8 g/dL (ref 30.0–36.0)
MCV: 96.4 fL (ref 80.0–100.0)
Platelets: 265 10*3/uL (ref 150–400)
RBC: 3.64 MIL/uL — ABNORMAL LOW (ref 4.22–5.81)
RDW: 13.5 % (ref 11.5–15.5)
WBC: 8.6 10*3/uL (ref 4.0–10.5)
nRBC: 0 % (ref 0.0–0.2)

## 2021-07-16 LAB — BASIC METABOLIC PANEL
Anion gap: 4 — ABNORMAL LOW (ref 5–15)
BUN: 10 mg/dL (ref 8–23)
CO2: 24 mmol/L (ref 22–32)
Calcium: 8.7 mg/dL — ABNORMAL LOW (ref 8.9–10.3)
Chloride: 109 mmol/L (ref 98–111)
Creatinine, Ser: 1 mg/dL (ref 0.61–1.24)
GFR, Estimated: >60 mL/min (ref >60)
Glucose, Bld: 118 mg/dL — ABNORMAL HIGH (ref 70–99)
Potassium: 4 mmol/L (ref 3.5–5.1)
Sodium: 137 mmol/L (ref 135–145)

## 2021-07-16 LAB — SURGICAL PCR SCREEN
MRSA, PCR: NEGATIVE
Staphylococcus aureus: NEGATIVE

## 2021-07-16 SURGERY — AMPUTATION, FOOT, TRANSMETATARSAL
Anesthesia: Monitor Anesthesia Care | Site: Toe | Laterality: Left

## 2021-07-16 MED ORDER — DEXAMETHASONE SODIUM PHOSPHATE 10 MG/ML IJ SOLN
INTRAMUSCULAR | Status: DC | PRN
  Administered 2021-07-16: 10 mg

## 2021-07-16 MED ORDER — INSULIN ASPART 100 UNIT/ML IJ SOLN: 15.0000 [IU] | Freq: Once | INTRAMUSCULAR | Status: DC

## 2021-07-16 MED ORDER — LIDOCAINE HCL (PF) 1 % IJ SOLN: INTRAMUSCULAR | Status: DC | PRN

## 2021-07-16 MED ORDER — OXYCODONE HCL 5 MG/5ML PO SOLN
5.0000 mg | Freq: Once | ORAL | Status: DC | PRN
Start: 2021-07-16 — End: 2021-07-16

## 2021-07-16 MED ORDER — HYDROMORPHONE HCL 1 MG/ML IJ SOLN: 0.2500 mg | INTRAMUSCULAR | Status: DC | PRN

## 2021-07-16 MED ORDER — PROPOFOL 1000 MG/100ML IV EMUL
INTRAVENOUS | Status: AC
  Filled 2021-07-16: qty 100

## 2021-07-16 MED ORDER — INSULIN GLARGINE 100 UNIT/ML ~~LOC~~ SOLN
25.0000 [IU] | Freq: Every day | SUBCUTANEOUS | Status: DC
  Administered 2021-07-16: 25 [IU] via SUBCUTANEOUS
  Filled 2021-07-16 (×3): qty 0.25

## 2021-07-16 MED ORDER — FENTANYL CITRATE (PF) 100 MCG/2ML IJ SOLN
INTRAMUSCULAR | Status: DC | PRN
  Administered 2021-07-16: 50 ug via INTRAVENOUS

## 2021-07-16 MED ORDER — ROPIVACAINE HCL 5 MG/ML IJ SOLN
INTRAMUSCULAR | Status: DC | PRN
  Administered 2021-07-16: 40 mL via PERINEURAL

## 2021-07-16 MED ORDER — INSULIN ASPART 100 UNIT/ML IJ SOLN
2.0000 [IU] | Freq: Three times a day (TID) | INTRAMUSCULAR | Status: DC
  Administered 2021-07-17 (×2): 2 [IU] via SUBCUTANEOUS

## 2021-07-16 MED ORDER — MUPIROCIN 2 % EX OINT: 1.0000 "application " | TOPICAL_OINTMENT | Freq: Two times a day (BID) | CUTANEOUS | Status: DC

## 2021-07-16 MED ORDER — ONDANSETRON HCL 4 MG/2ML IJ SOLN: 4.0000 mg | Freq: Once | INTRAMUSCULAR | Status: DC | PRN

## 2021-07-16 MED ORDER — INSULIN ASPART 100 UNIT/ML IJ SOLN
0.0000 [IU] | Freq: Three times a day (TID) | INTRAMUSCULAR | Status: DC
  Administered 2021-07-17: 4 [IU] via SUBCUTANEOUS
  Administered 2021-07-17: 7 [IU] via SUBCUTANEOUS
  Administered 2021-07-17: 4 [IU] via SUBCUTANEOUS

## 2021-07-16 MED ORDER — RAMELTEON 8 MG PO TABS
8.0000 mg | ORAL_TABLET | Freq: Once | ORAL | Status: AC
  Administered 2021-07-17: 8 mg via ORAL
  Filled 2021-07-16: qty 1

## 2021-07-16 MED ORDER — SODIUM CHLORIDE 0.9 % IV SOLN: INTRAVENOUS | Status: DC | PRN

## 2021-07-16 MED ORDER — OXYCODONE HCL 5 MG PO TABS: 5.0000 mg | ORAL_TABLET | Freq: Once | ORAL | Status: DC | PRN

## 2021-07-16 MED ORDER — 0.9 % SODIUM CHLORIDE (POUR BTL) OPTIME
TOPICAL | Status: DC | PRN
  Administered 2021-07-16: 1000 mL

## 2021-07-16 MED ORDER — INSULIN ASPART 100 UNIT/ML IJ SOLN
20.0000 [IU] | Freq: Once | INTRAMUSCULAR | Status: AC
  Administered 2021-07-16: 20 [IU] via SUBCUTANEOUS

## 2021-07-16 MED ORDER — FENTANYL CITRATE (PF) 250 MCG/5ML IJ SOLN
INTRAMUSCULAR | Status: AC
  Filled 2021-07-16: qty 5

## 2021-07-16 MED ORDER — LIDOCAINE HCL (CARDIAC) PF 100 MG/5ML IV SOSY
PREFILLED_SYRINGE | INTRAVENOUS | Status: DC | PRN
  Administered 2021-07-16: 60 mg via INTRAVENOUS

## 2021-07-16 MED ORDER — MUPIROCIN 2 % EX OINT
TOPICAL_OINTMENT | CUTANEOUS | Status: AC
  Administered 2021-07-16: 1 via NASAL
  Filled 2021-07-16: qty 22

## 2021-07-16 MED ORDER — LIDOCAINE HCL (PF) 1 % IJ SOLN
INTRAMUSCULAR | Status: AC
  Filled 2021-07-16: qty 30

## 2021-07-16 MED ORDER — MIDAZOLAM HCL 5 MG/5ML IJ SOLN
INTRAMUSCULAR | Status: DC | PRN
  Administered 2021-07-16: 2 mg via INTRAVENOUS

## 2021-07-16 MED ORDER — ONDANSETRON HCL 4 MG/2ML IJ SOLN
INTRAMUSCULAR | Status: DC | PRN
Start: 2021-07-16 — End: 2021-07-16
  Administered 2021-07-16: 4 mg via INTRAVENOUS

## 2021-07-16 MED ORDER — MIDAZOLAM HCL 2 MG/2ML IJ SOLN
INTRAMUSCULAR | Status: AC
  Filled 2021-07-16: qty 2

## 2021-07-16 MED ORDER — INSULIN ASPART 100 UNIT/ML IJ SOLN: 0.0000 [IU] | Freq: Three times a day (TID) | INTRAMUSCULAR | Status: DC

## 2021-07-16 MED ORDER — PROPOFOL 500 MG/50ML IV EMUL
INTRAVENOUS | Status: DC | PRN
  Administered 2021-07-16: 50 ug/kg/min via INTRAVENOUS

## 2021-07-16 MED ORDER — ENOXAPARIN SODIUM 40 MG/0.4ML IJ SOSY
40.0000 mg | PREFILLED_SYRINGE | INTRAMUSCULAR | Status: DC
  Administered 2021-07-16: 40 mg via SUBCUTANEOUS
  Filled 2021-07-16: qty 0.4

## 2021-07-16 SURGICAL SUPPLY — 37 items
BAG COUNTER SPONGE SURGICOUNT (BAG) ×2 IMPLANT
BLADE AVERAGE 25X9 (BLADE) ×2 IMPLANT
BLADE SAW SGTL 81X20 HD (BLADE) ×2 IMPLANT
BNDG CONFORM 3 STRL LF (GAUZE/BANDAGES/DRESSINGS) IMPLANT
BNDG ELASTIC 4X5.8 VLCR STR LF (GAUZE/BANDAGES/DRESSINGS) ×2 IMPLANT
BNDG GAUZE ELAST 4 BULKY (GAUZE/BANDAGES/DRESSINGS) ×2 IMPLANT
CANISTER SUCT 3000ML PPV (MISCELLANEOUS) ×2 IMPLANT
CLIP LIGATING EXTRA MED SLVR (CLIP) ×2 IMPLANT
CLIP LIGATING EXTRA SM BLUE (MISCELLANEOUS) ×2 IMPLANT
COVER SURGICAL LIGHT HANDLE (MISCELLANEOUS) ×2 IMPLANT
DRAPE EXTREMITY T 121X128X90 (DISPOSABLE) ×2 IMPLANT
DRAPE HALF SHEET 40X57 (DRAPES) ×2 IMPLANT
DRSG EMULSION OIL 3X3 NADH (GAUZE/BANDAGES/DRESSINGS) ×2 IMPLANT
ELECT REM PT RETURN 9FT ADLT (ELECTROSURGICAL) ×2
ELECTRODE REM PT RTRN 9FT ADLT (ELECTROSURGICAL) ×1 IMPLANT
GAUZE 4X4 16PLY ~~LOC~~+RFID DBL (SPONGE) ×2 IMPLANT
GAUZE SPONGE 4X4 12PLY STRL (GAUZE/BANDAGES/DRESSINGS) ×2 IMPLANT
GLOVE SURG ENC MOIS LTX SZ7.5 (GLOVE) ×2 IMPLANT
GOWN STRL REUS W/ TWL LRG LVL3 (GOWN DISPOSABLE) ×2 IMPLANT
GOWN STRL REUS W/ TWL XL LVL3 (GOWN DISPOSABLE) ×2 IMPLANT
GOWN STRL REUS W/TWL LRG LVL3 (GOWN DISPOSABLE) ×4
GOWN STRL REUS W/TWL XL LVL3 (GOWN DISPOSABLE) ×4
KIT BASIN OR (CUSTOM PROCEDURE TRAY) ×2 IMPLANT
KIT TURNOVER KIT B (KITS) ×2 IMPLANT
NEEDLE HYPO 25GX1X1/2 BEV (NEEDLE) ×2 IMPLANT
NS IRRIG 1000ML POUR BTL (IV SOLUTION) ×2 IMPLANT
PACK GENERAL/GYN (CUSTOM PROCEDURE TRAY) ×2 IMPLANT
PAD ARMBOARD 7.5X6 YLW CONV (MISCELLANEOUS) ×4 IMPLANT
SPECIMEN JAR SMALL (MISCELLANEOUS) ×2 IMPLANT
SPONGE T-LAP 18X18 ~~LOC~~+RFID (SPONGE) ×4 IMPLANT
SUT ETHILON 2 0 PSLX (SUTURE) ×4 IMPLANT
SUT ETHILON 3 0 PS 1 (SUTURE) ×4 IMPLANT
SYR CONTROL 10ML LL (SYRINGE) ×2 IMPLANT
TOWEL GREEN STERILE (TOWEL DISPOSABLE) ×4 IMPLANT
TOWEL GREEN STERILE FF (TOWEL DISPOSABLE) ×2 IMPLANT
UNDERPAD 30X36 HEAVY ABSORB (UNDERPADS AND DIAPERS) ×2 IMPLANT
WATER STERILE IRR 1000ML POUR (IV SOLUTION) ×2 IMPLANT

## 2021-07-16 NOTE — Progress Notes (Signed)
  Progress Note    07/16/2021 7:26 AM Day of Surgery  Subjective:  no overnight issues  Vitals:   07/15/21 2328 07/16/21 0352  BP: (!) 96/56 (!) 101/41  Pulse: 83 88  Resp: 18 20  Temp: 97.6 F (36.4 C) 98.5 F (36.9 C)  SpO2: 95% 98%    Physical Exam: Aaox3 Left foot with stable gangrene changes  CBC    Component Value Date/Time   WBC 8.6 07/16/2021 0621   RBC 3.64 (L) 07/16/2021 0621   HGB 11.5 (L) 07/16/2021 0621   HCT 35.1 (L) 07/16/2021 0621   PLT 265 07/16/2021 0621   MCV 96.4 07/16/2021 0621   MCH 31.6 07/16/2021 0621   MCHC 32.8 07/16/2021 0621   RDW 13.5 07/16/2021 0621   LYMPHSABS 1.3 07/14/2021 0034   MONOABS 1.1 (H) 07/14/2021 0034   EOSABS 0.1 07/14/2021 0034   BASOSABS 0.0 07/14/2021 0034    BMET    Component Value Date/Time   NA 136 07/15/2021 0159   K 4.2 07/15/2021 0159   CL 104 07/15/2021 0159   CO2 25 07/15/2021 0159   GLUCOSE 168 (H) 07/15/2021 0159   BUN 12 07/15/2021 0159   CREATININE 0.92 07/15/2021 0159   CALCIUM 9.3 07/15/2021 0159   GFRNONAA >60 07/15/2021 0159   GFRAA 88 08/30/2008 1249    INR    Component Value Date/Time   INR 1.4 09/22/2007 1200     Intake/Output Summary (Last 24 hours) at 07/16/2021 0726 Last data filed at 07/16/2021 0530 Gross per 24 hour  Intake 1399.93 ml  Output --  Net 1399.93 ml     Assessment:  75 y.o. male is here with progressive ischemic changes and infection left foot  Plan: OR today left foot tma   Freedom Lopezperez C. Randie Heinz, MD [facility] [place] Office: [phone] Pager: [phone]  07/16/2021 7:26 AM

## 2021-07-16 NOTE — Anesthesia Postprocedure Evaluation (Signed)
Anesthesia Post Note  Patient: [name]  Procedure(s) Performed: LEFT TRANSMETATARSAL AMPUTATION (Left: Toe)     Patient location during evaluation: PACU Anesthesia Type: Regional and MAC Level of consciousness: awake and alert Pain management: pain level controlled Vital Signs Assessment: post-procedure vital signs reviewed and stable Respiratory status: spontaneous breathing, nonlabored ventilation and respiratory function stable Cardiovascular status: blood pressure returned to baseline and stable Postop Assessment: no apparent nausea or vomiting Anesthetic complications: no   No notable events documented.  Last Vitals:  Vitals:   07/16/21 0905 07/16/21 0936  BP: (!) 139/97 (!) 106/54  Pulse:  82  Resp:  19  Temp: 36.6 C 36.6 C  SpO2: 93% 93%    Last Pain:  Vitals:   07/16/21 0936  TempSrc: Oral  PainSc:                  Pervis Hocking

## 2021-07-16 NOTE — Evaluation (Signed)
Occupational Therapy Evaluation Patient Details Name: [name] MRN: [id number] DOB: 1946-06-30 Today's Date: 07/16/2021    History of Present Illness Pt is a 75 year old man who presented with L foot infection due to nonhealing wound. Imaging showed osteomyelitis of L third and fourth toes. 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 PMH: MI, DM II, HLD, PVD, CVA.   Clinical Impression   PTA, pt lives with wife and reports Independence with ADLs and mobility without AD. Pt presents now s/p procedure above with nerve block still in effect. Awaiting darco shoe delivery, so limited mobility this PM. Pt overall Min A at most for basic transfers using RW, notably SOB after this task. Pt overall Independent for UB ADLs and Min A for LB ADLs. Reinforced safety strategies, DME techniques and WB precautions. Plan to assess mgmt of darco shoe, safety with ADLs and tub transfers in next sessions. Recommend HHOT at this time though pt may progress to no longer require OT follow-up. Will continue to monitor and update recs as appropriate.     Follow Up Recommendations  Home health OT;Supervision - Intermittent    Equipment Recommendations  Other (comment) (Rolling walker)    Recommendations for Other Services       Precautions / Restrictions Precautions Precautions: Fall Required Braces or Orthoses: Other Brace Other Brace: darco shoe (not in room for OT eval; called ortho tech to request flat darco shoe due to pt reported fall with large heel darco shoe) Restrictions Weight Bearing Restrictions: Yes Other Position/Activity Restrictions: no formal orders but assumed WB through heel      Mobility Bed Mobility Overal bed mobility: Needs Assistance Bed Mobility: Supine to Sit     Supine to sit: Min assist;HOB elevated     General bed mobility comments: light Min A via handheld assist to lift trunk    Transfers Overall transfer level: Needs  assistance Equipment used: Rolling walker (2 wheeled) Transfers: Sit to/from [employer] Sit to Stand: Min assist Stand pivot transfers: Min guard       General transfer comment: Min A to power up, cues for hand placement and able to take steps to recliner at min guard    Balance Overall balance assessment: Needs assistance Sitting-balance support: No upper extremity supported;Feet supported Sitting balance-Leahy Scale: Fair     Standing balance support: Bilateral upper extremity supported;During functional activity Standing balance-Leahy Scale: Poor Standing balance comment: reliant on UE support in standing                           ADL either performed or assessed with clinical judgement   ADL Overall ADL's : Needs assistance/impaired Eating/Feeding: Independent;Sitting   Grooming: Set up;Sitting   Upper Body Bathing: Independent;Sitting   Lower Body Bathing: Min guard;Sit to/from stand   Upper Body Dressing : Independent;Sitting   Lower Body Dressing: Minimal assistance;Sit to/from stand Lower Body Dressing Details (indicate cue type and reason): reports difficulty manuevering L LE. educated to don L LE first to ease ADL tasks Toilet Transfer: Min Hotel manager Details (indicate cue type and reason): simulated to recliner Toileting- Architect and Hygiene: Min guard;Sit to/from stand         General ADL Comments: Pt without darco shoe in room and nerve block in effect (pt unable to feel foot), so limited to transfers only this PM. Reinforced safety strategies with LB ADLs, WB recommendations  Vision Baseline Vision/History: Wears glasses Wears Glasses: At all times Patient Visual Report: No change from baseline Vision Assessment?: No apparent visual deficits     Perception     Praxis      Pertinent Vitals/Pain       Hand Dominance Right   Extremity/Trunk Assessment Upper Extremity  Assessment Upper Extremity Assessment: Overall WFL for tasks assessed   Lower Extremity Assessment Lower Extremity Assessment: Defer to PT evaluation   Cervical / Trunk Assessment Cervical / Trunk Assessment: Normal   Communication Communication Communication: HOH   Cognition Arousal/Alertness: Awake/alert Behavior During Therapy: WFL for tasks assessed/performed Overall Cognitive Status: No family/caregiver present to determine baseline cognitive functioning                                 General Comments: WFL for basic tasks, some difficulty word finding and attention to tasks but could be due to anesthesia early today   General Comments  Noted increased WOB after transfer with SpO2 90% on RA. Unclear if DOE is new for pt or not. After seated rest break, breathing normalized and SpO2 97%    Exercises     Shoulder Instructions      Home Living Family/patient expects to be discharged to:: Private residence Living Arrangements: Spouse/significant other Available Help at Discharge: Family;Available 24 hours/day Type of Home: House Home Access: Stairs to enter [employer] of Steps: 1 threshold step Entrance Stairs-Rails: None (but has colums on porch) Home Layout: One level     Bathroom Shower/Tub: Chief Strategy Officer: Standard     Home Equipment: Shower seat;Hand held shower head          Prior Functioning/Environment Level of Independence: Independent        Comments: reports no use of AD for mobility but will hold to walls/furniture as needed. Reports difficulty getting around since March (when nonhealing wounds started). Reports Independent with ADLs (uses shower chair), drives grandson to school. Reports one relatively recent fall with traditional darco shoe        OT Problem List: Decreased activity tolerance;Impaired balance (sitting and/or standing);Decreased knowledge of use of DME or AE;Decreased knowledge of  precautions      OT Treatment/Interventions: Self-care/ADL training;Therapeutic exercise;Energy conservation;DME and/or AE instruction;Therapeutic activities;Patient/family education;Balance training    OT Goals(Current goals can be found in the care plan section) Acute Rehab OT Goals Patient Stated Goal: be able to get out of bed OT Goal Formulation: With patient Time For Goal Achievement: 07/30/21 Potential to Achieve Goals: Good  OT Frequency: Min 2X/week   Barriers to D/C:            Co-evaluation              AM-PAC OT "6 Clicks" Daily Activity     Outcome Measure Help from another person eating meals?: None Help from another person taking care of personal grooming?: A Little Help from another person toileting, which includes using toliet, bedpan, or urinal?: A Little Help from another person bathing (including washing, rinsing, drying)?: A Little Help from another person to put on and taking off regular upper body clothing?: None Help from another person to put on and taking off regular lower body clothing?: A Little 6 Click Score: 20   End of Session Equipment Utilized During Treatment: Rolling walker Nurse Communication: Mobility status;Other (comment) (darco shoe)  Activity Tolerance: Patient tolerated treatment well Patient left: in  chair;with call bell/phone within reach;with chair alarm set  OT Visit Diagnosis: Unsteadiness on feet (R26.81);Other abnormalities of gait and mobility (R26.89)                Time: 3500-9381 OT Time Calculation (min): 25 min Charges:  OT General Charges $OT Visit: 1 Visit OT Evaluation $OT Eval Low Complexity: 1 Low OT Treatments $Therapeutic Activity: 8-22 mins  Bradd Canary, OTR/L Acute Rehab Services Office: [phone]   Lorre Munroe 07/16/2021, 2:33 PM

## 2021-07-16 NOTE — Evaluation (Signed)
Physical Therapy Evaluation Patient Details Name: [name] MRN: [id number] DOB: 1946/06/05 Today's Date: 07/16/2021   History of Present Illness  Pt is a 75 year old man who presented with L foot infection due to nonhealing wound. Imaging showed osteomyelitis of L third and fourth toes. 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 PMH: MI, DM II, HLD, PVD, CVA.  Clinical Impression  Pt presents to PT with deficits in functional mobility, strength, power, AROM, adherence to precautions. On eval pt's L foot remains numb with limited ability to dorsiflex L ankle. PT instructing pt in heel weightbearing during session (no official WB orders documented in chart), pt experiencing difficulty maintaining Heel WB due to DF weakness. Post-op shoe present also does not assist in offloading site of amputation, PT encourages pt to have family bring [facility] heel WB shoe in from vehicle when able. Pt will benefit from initiation of gait training if dorsiflexors demonstrate improved strength or if [facility] shoe is present at next session. PT recommends discharge home with HHPT and a RW.    Follow Up Recommendations Home health PT;Supervision for mobility/OOB    Equipment Recommendations  Rolling walker with 5" wheels    Recommendations for Other Services       Precautions / Restrictions Precautions Precautions: Fall Required Braces or Orthoses: Other Brace Other Brace: darco shoe (no toe cutout in shoe present, PT requests pt's spouse bring old [facility] as current does not aide in offloading amputation site) Restrictions Weight Bearing Restrictions: Yes Other Position/Activity Restrictions: no written orders in chart, pt treated as heel weightbearing during this session      Mobility  Bed Mobility Overal bed mobility: Needs Assistance Bed Mobility: Supine to Sit     Supine to sit: Min assist;HOB elevated     General bed mobility comments: light Min A via  handheld assist to lift trunk    Transfers Overall transfer level: Needs assistance Equipment used: Rolling walker (2 wheeled) Transfers: Sit to/from [employer] Sit to Stand: Min guard Stand pivot transfers: Min guard       General transfer comment: PT provides cues for hand placement and to maintain heel WB  Ambulation/Gait Ambulation/Gait assistance:  (gait deferred 2/2 inability to actively dorsiflex L ankle and maintain heel weightbearing)              Stairs            Wheelchair Mobility    Modified Rankin (Stroke Patients Only)       Balance Overall balance assessment: Needs assistance Sitting-balance support: No upper extremity supported;Feet supported Sitting balance-Leahy Scale: Good     Standing balance support: Single extremity supported;During functional activity Standing balance-Leahy Scale: Poor Standing balance comment: minA to correct posterior LOB when performing hygiene activities                             Pertinent Vitals/Pain Pain Assessment: No/denies pain    Home Living Family/patient expects to be discharged to:: Private residence Living Arrangements: Spouse/significant other Available Help at Discharge: Family;Available 24 hours/day Type of Home: House Home Access: Stairs to enter Entrance Stairs-Rails: None Entrance Stairs-Number of Steps: 1 threshold step Home Layout: One level Home Equipment: Shower seat;Hand held shower head      Prior Function Level of Independence: Independent         Comments: reports no use of AD for mobility but will hold  to walls/furniture as needed. Reports difficulty getting around since March (when nonhealing wounds started). Reports Independent with ADLs (uses shower chair), drives grandson to school. Reports one relatively recent fall with traditional darco shoe     Hand Dominance   Dominant Hand: Right    Extremity/Trunk Assessment   Upper Extremity  Assessment Upper Extremity Assessment: Overall WFL for tasks assessed    Lower Extremity Assessment Lower Extremity Assessment: LLE deficits/detail LLE Sensation: decreased light touch (numb L foot)    Cervical / Trunk Assessment Cervical / Trunk Assessment: Normal  Communication   Communication: No difficulties  Cognition Arousal/Alertness: Awake/alert Behavior During Therapy: WFL for tasks assessed/performed Overall Cognitive Status: Within Functional Limits for tasks assessed                                 General Comments: WFL for basic tasks, some difficulty word finding and attention to tasks but could be due to anesthesia early today      General Comments General comments (skin integrity, edema, etc.): VSS on RA    Exercises     Assessment/Plan    PT Assessment Patient needs continued PT services  PT Problem List Decreased strength;Decreased activity tolerance;Decreased balance;Decreased mobility;Decreased knowledge of precautions       PT Treatment Interventions DME instruction;Gait training;Stair training;Functional mobility training;Therapeutic exercise;Therapeutic activities;Balance training;Patient/family education    PT Goals (Current goals can be found in the Care Plan section)  Acute Rehab PT Goals Patient Stated Goal: to return to independent mobility PT Goal Formulation: With patient Time For Goal Achievement: 07/30/21 Potential to Achieve Goals: Good    Frequency Min 3X/week   Barriers to discharge        Co-evaluation               AM-PAC PT "6 Clicks" Mobility  Outcome Measure Help needed turning from your back to your side while in a flat bed without using bedrails?: None Help needed moving from lying on your back to sitting on the side of a flat bed without using bedrails?: None Help needed moving to and from a bed to a chair (including a wheelchair)?: A Little Help needed standing up from a chair using your arms  (e.g., wheelchair or bedside chair)?: A Little Help needed to walk in hospital room?: A Lot Help needed climbing 3-5 steps with a railing? : A Lot 6 Click Score: 18    End of Session   Activity Tolerance: Patient tolerated treatment well Patient left: in chair;with call bell/phone within reach;with chair alarm set Nurse Communication: Mobility status PT Visit Diagnosis: Unsteadiness on feet (R26.81);Other abnormalities of gait and mobility (R26.89)    Time: 9767-3419 PT Time Calculation (min) (ACUTE ONLY): 23 min   Charges:   PT Evaluation $PT Eval Low Complexity: 1 Low          Arlyss Gandy, PT, DPT Acute Rehabilitation Pager: [phone]   Arlyss Gandy 07/16/2021, 5:11 PM

## 2021-07-16 NOTE — Addendum Note (Signed)
Addendum  created 07/16/21 1158 by Shanon Payor, CRNA   LDA properties accepted, LDA removed

## 2021-07-16 NOTE — Progress Notes (Signed)
Mobility Specialist: Progress Note   07/16/21 1735  Mobility  Activity Ambulated in room  Level of Assistance Contact guard assist, steadying assist  Assistive Device Front wheel walker  Distance Ambulated (ft) 8 ft  Mobility Ambulated with assistance in room  Mobility Response Tolerated well  Mobility performed by Mobility specialist  Bed Position Chair  $Mobility charge 1 Mobility   Pre-Mobility: 86 HR Post-Mobility: 93 HR, 90% SpO2  Pt eager to ambulate upon entering room. Pt says he still doesn't have any feeling in his L foot and was unable to perform dorsi or plantar flexion with his L foot. Pt was able to take a couple steps forward as well as side to side at the chair and then sat back down. Pt said his wife is bringing his darco shoe this evening for therapy tomorrow. Pt is back in the chair with call bell in his lap.   [facility] [name] Mobility Specialist Mobility Specialist Phone: [phone]

## 2021-07-16 NOTE — Progress Notes (Signed)
Subjective:  Overnight: No acute events or concerns overnight.  Patient seen and examined on rounds while recovering in the PACU. Patient is drowsy but awake and answering questions. He reports surgery went well from his standpoint. He denies pain. He had no additional complaints.   Objective:  Vital signs in last 24 hours: Vitals:   07/15/21 1922 07/15/21 1945 07/15/21 2328 07/16/21 0352  BP: (!) 131/35 (!) 103/91 (!) 96/56 (!) 101/41  Pulse: 86  83 88  Resp: 20 16 18 20   Temp: (!) 97.5 F (36.4 C)  97.6 F (36.4 C) 98.5 F (36.9 C)  TempSrc: Oral  Oral Oral  SpO2: 96% 91% 95% 98%  Weight:      Height:       Intake/Output Summary (Last 24 hours) at 07/16/2021 0732 Last data filed at 07/16/2021 0530 Gross per 24 hour  Intake 1399.93 ml  Output --  Net 1399.93 ml    Physical Exam General: alert, appears stated age, in no acute distress HEENT: Normocephalic, atraumatic, conjunctiva normal CV: Regular rate and rhythm, no murmurs rubs or gallops Pulm: Clear to auscultation anteriorly bilaterally, normal work of breathing on RA Abdomen: Soft, nondistended, bowel sounds present, no tenderness to palpation MSK: dressing on L TMA appears clean Skin: Erythema and warmth of the left forefoot, Right groin puncture site without erythema or bleeding Neuro: Alert and oriented x3  Assessment/Plan:  Active Problems:   DM (diabetes mellitus) type II, controlled, with peripheral vascular disorder (HCC)   PAD (peripheral artery disease) (HCC)   Osteomyelitis (HCC)  Mr. Rahim Astorga is a 75 year old male with a past medical history of type 2 diabetes mellitus, PVD, hypertension, CAD, and prior left foot fourth digit amputation who presented with foot pain and was admitted for treatment of osteomyelitis of his left foot on hospital day 5    Osteomyelitis of the L Foot w/ Ischemic Changes s/p LTMA PAD/PVD T2DM Patient presents with progression of his left forefoot ischemia and  infection.  Plan for angiogram 7/25 with vascular surgery with possible revascularization.  Continuing IV antibiotics.  He remains afebrile and hemodynamically stable.   -Vascular on board, appreciate recommendations -L transmetatarsal amputation today -Continue vancomycin, cefepime and metronidazole for 24hrs post op then narrow abx regimen -Continue oxycodone-acetaminophen 5-3 25 1  to 2 tablets every 8 hours for moderate pain and IV Dilaudid 0.5 mg every 6 hours as needed severe pain -Continue aspirin and Plavix -PT/OT eval, possible CIR candidate   COVID-positive Asymptomatic.  Incidental finding on admission.  Patient reports testing positive back in June, unable to see these results patient denies any signs or symptoms of COVID infection. No longer needs to be on isolation.    Type 2 Diabetes Myelitis Home medications include Ozempic weekly, metformin 1000 mg twice daily and NovoLog Mix 70/30 30 units twice daily.  Patient will need good glucose control in the setting of infection and pending surgical amputation.  - Lantus 22 units daily, uptitrate as needed, will monitor closely given patient is post op, goal BG <180 - SSI   Anxiety Depression Holding citalopram short-term to prevent risk of serotonin syndrome.   -Continue home medications trazodone and duloxetine daily   History of Myocardial Infarction Hyperlipidemia Home medication metoprolol 50 mg twice daily held overnight due to soft pressures.   - Decreased home dose of metoprolol 50 mg once daily, can resume twice daily if pressures trend up - Atorvastatin 40 mg daily - Aspirin 81 monitor daily  Edema Patient  is on furosemide 40 mg daily, presume for lower extremity edema. Holding in the setting of normotensive blood pressures  Prior to Admission Living Arrangement: Home Anticipated Discharge Location: CIR Barriers to Discharge: Medical workup/surgery  Dispo: Anticipated discharge pending surgery  Ellison Carwin,  MD 07/16/2021, 7:32 AM Pager: [phone] After 5pm on weekdays and 1pm on weekends: On Call pager [phone]

## 2021-07-16 NOTE — Op Note (Signed)
    Patient name: [name] MRN: [id number] DOB: 1946-11-26 Sex: male  07/16/2021 Pre-operative Diagnosis: Infection of left fourth and third metatarsal heads with osteomyelitis of left third toe Post-operative diagnosis:  Same Surgeon:  Eda Paschal. Donzetta Matters, MD Procedure Performed:  Left foot transmetatarsal amputation  Indications: 75 year old male has undergone left fourth toe amputation.  He has progressive ischemic changes of the distal foot.  He is indicated for transmetatarsal amputation.  He is undergone angiography there is no role for intervention.  Findings: Fourth metatarsal head was soft consistent with osteomyelitis as was the third metatarsal head.  There was some pus around the base of the third toe and previous amputation of the fourth toe.  There was good capillary bleeding to suggest healing probability of the transmetatarsal amputation.   Procedure:  The patient was identified in the holding area and taken to the operating was placed supine on upper table and MAC anesthesia was induced.  He was sterilely prepped and draped in the left foot and ankle in usual fashion, antibiotics were ministered a timeout was called.  His block was checked was noted to be intact.  [place] type incision was made around all of his toes.  There was some purulence at the base of the fourth and third toes.  We transected all of the metatarsals the first with a saw and the other 4 with bone cutter and the toes were passed off the table and block.  We smoothed the metatarsal bones with rasp.  We thoroughly irrigated the wound.  We remove the joint capsules where they remained.  There was capillary bleeding no pulsatile bleeding.  We obtain pneumostasis in the wound.  We closed with interrupted nylon sutures some 2-0 nylon sutures were placed in a mattress configuration and there were 3-0 nylon sutures that were simple for skin closure.  Sterile dressing was placed.  He was awakened from anesthesia having  tolerated seizure without any complication.  All counts were correct at completion  EBL: 50cc   Chia Rock C. Donzetta Matters, MD [facility] [place] Office: [phone] Pager: [phone]

## 2021-07-16 NOTE — Anesthesia Procedure Notes (Signed)
Anesthesia Regional Block: Popliteal block   Pre-Anesthetic Checklist: , timeout performed,  Correct Patient, Correct Site, Correct Laterality,  Correct Procedure, Correct Position, site marked,  Risks and benefits discussed,  Surgical consent,  Pre-op evaluation,  At surgeon's request and post-op pain management  Laterality: Left  Prep: Maximum Sterile Barrier Precautions used, chloraprep       Needles:  Injection technique: Single-shot  Needle Type: Echogenic Stimulator Needle     Needle Length: 9cm  Needle Gauge: 22     Additional Needles:   Procedures:,,,, ultrasound used (permanent image in chart),,    Narrative:  Start time: 07/16/2021 7:00 AM End time: 07/16/2021 7:05 AM Injection made incrementally with aspirations every 5 mL.  Performed by: Personally  Anesthesiologist: Lannie Fields, DO  Additional Notes: Monitors applied. No increased pain on injection. No increased resistance to injection. Injection made in 5cc increments. Good needle visualization. Patient tolerated procedure well.

## 2021-07-16 NOTE — Transfer of Care (Signed)
Immediate Anesthesia Transfer of Care Note  Patient: [name]  Procedure(s) Performed: LEFT TRANSMETATARSAL AMPUTATION (Left: Toe)  Patient Location: PACU  Anesthesia Type:MAC combined with regional for post-op pain  Level of Consciousness: awake, alert  and oriented  Airway & Oxygen Therapy: Patient Spontanous Breathing and Patient connected to face mask oxygen  Post-op Assessment: Report given to RN, Post -op Vital signs reviewed and stable and Patient moving all extremities  Post vital signs: Reviewed and stable  Last Vitals:  Vitals Value Taken Time  BP 139/61 07/16/21 0837  Temp    Pulse 87 07/16/21 0837  Resp 15 07/16/21 0837  SpO2 99 % 07/16/21 0837  Vitals shown include unvalidated device data.  Last Pain:  Vitals:   07/16/21 0458  TempSrc:   PainSc: Asleep      Patients Stated Pain Goal: 0 (71/21/97 5883)  Complications: No notable events documented.

## 2021-07-16 NOTE — Progress Notes (Signed)
Orthopedic Tech Progress Note Patient Details:  [name] 1946-04-05 [id number]  THERAPY called requesting a SHORT DARCO HEEL shoe for patient. HIGH WEDGE DARCO made patient unstable   Ortho Devices Type of Ortho Device: Darco shoe Ortho Device/Splint Location: LLE Ortho Device/Splint Interventions: Adjustment, Application   Post Interventions Patient Tolerated: Well Instructions Provided: Care of device  Donald Pore 07/16/2021, 3:09 PM

## 2021-07-16 NOTE — Anesthesia Procedure Notes (Signed)
Anesthesia Regional Block: Adductor canal block   Pre-Anesthetic Checklist: , timeout performed,  Correct Patient, Correct Site, Correct Laterality,  Correct Procedure, Correct Position, site marked,  Risks and benefits discussed,  Surgical consent,  Pre-op evaluation,  At surgeon's request and post-op pain management  Laterality: Left  Prep: Maximum Sterile Barrier Precautions used, chloraprep       Needles:  Injection technique: Single-shot  Needle Type: Echogenic Stimulator Needle     Needle Length: 9cm  Needle Gauge: 22     Additional Needles:   Procedures:,,,, ultrasound used (permanent image in chart),,    Narrative:  Start time: 07/16/2021 7:05 AM End time: 07/16/2021 7:10 AM Injection made incrementally with aspirations every 5 mL.  Performed by: Personally  Anesthesiologist: Lannie Fields, DO  Additional Notes: Monitors applied. No increased pain on injection. No increased resistance to injection. Injection made in 5cc increments. Good needle visualization. Patient tolerated procedure well.

## 2021-07-17 ENCOUNTER — Encounter (HOSPITAL_COMMUNITY): Payer: Self-pay | Admitting: Vascular Surgery

## 2021-07-17 DIAGNOSIS — M86172 Other acute osteomyelitis, left ankle and foot: Secondary | ICD-10-CM | POA: Diagnosis not present

## 2021-07-17 DIAGNOSIS — Z89432 Acquired absence of left foot: Secondary | ICD-10-CM

## 2021-07-17 DIAGNOSIS — E1151 Type 2 diabetes mellitus with diabetic peripheral angiopathy without gangrene: Secondary | ICD-10-CM | POA: Diagnosis not present

## 2021-07-17 DIAGNOSIS — L089 Local infection of the skin and subcutaneous tissue, unspecified: Secondary | ICD-10-CM | POA: Diagnosis not present

## 2021-07-17 LAB — CBC
HCT: 35.8 % — ABNORMAL LOW (ref 39.0–52.0)
Hemoglobin: 12 g/dL — ABNORMAL LOW (ref 13.0–17.0)
MCH: 32.1 pg (ref 26.0–34.0)
MCHC: 33.5 g/dL (ref 30.0–36.0)
MCV: 95.7 fL (ref 80.0–100.0)
Platelets: 315 10*3/uL (ref 150–400)
RBC: 3.74 MIL/uL — ABNORMAL LOW (ref 4.22–5.81)
RDW: 13.5 % (ref 11.5–15.5)
WBC: 12.6 10*3/uL — ABNORMAL HIGH (ref 4.0–10.5)
nRBC: 0 % (ref 0.0–0.2)

## 2021-07-17 LAB — GLUCOSE, CAPILLARY
Glucose-Capillary: 152 mg/dL — ABNORMAL HIGH (ref 70–99)
Glucose-Capillary: 198 mg/dL — ABNORMAL HIGH (ref 70–99)
Glucose-Capillary: 222 mg/dL — ABNORMAL HIGH (ref 70–99)
Glucose-Capillary: 242 mg/dL — ABNORMAL HIGH (ref 70–99)

## 2021-07-17 LAB — BASIC METABOLIC PANEL
Anion gap: 8 (ref 5–15)
BUN: 17 mg/dL (ref 8–23)
CO2: 24 mmol/L (ref 22–32)
Calcium: 9.4 mg/dL (ref 8.9–10.3)
Chloride: 101 mmol/L (ref 98–111)
Creatinine, Ser: 1.22 mg/dL (ref 0.61–1.24)
GFR, Estimated: >60 mL/min (ref >60)
Glucose, Bld: 192 mg/dL — ABNORMAL HIGH (ref 70–99)
Potassium: 4.8 mmol/L (ref 3.5–5.1)
Sodium: 133 mmol/L — ABNORMAL LOW (ref 135–145)

## 2021-07-17 MED ORDER — HYDROCODONE-ACETAMINOPHEN 5-325 MG PO TABS: 1.0000 | ORAL_TABLET | ORAL | Status: DC | PRN

## 2021-07-17 MED ORDER — ATORVASTATIN CALCIUM 40 MG PO TABS: 40.0000 mg | ORAL_TABLET | Freq: Every day | ORAL | Status: DC

## 2021-07-17 MED ORDER — CLOPIDOGREL BISULFATE 75 MG PO TABS
75.0000 mg | ORAL_TABLET | Freq: Every day | ORAL | Status: DC
  Administered 2021-07-17: 75 mg via ORAL
  Filled 2021-07-17: qty 1

## 2021-07-17 MED ORDER — HYDROCODONE-ACETAMINOPHEN 5-325 MG PO TABS: 1.0000 | ORAL_TABLET | ORAL | 0 refills | Status: AC | PRN

## 2021-07-17 MED ORDER — ACETAMINOPHEN 500 MG PO TABS: 500.0000 mg | ORAL_TABLET | ORAL | 0 refills | Status: AC | PRN

## 2021-07-17 MED ORDER — ADULT MULTIVITAMIN W/MINERALS CH: 1.0000 | ORAL_TABLET | Freq: Every day | ORAL | 0 refills | Status: AC

## 2021-07-17 NOTE — Progress Notes (Addendum)
  Progress Note    07/17/2021 7:47 AM 1 Day Post-Op  Subjective:  no complaints, sitting up in chair. Hopeful to go home tomorrow   Vitals:   07/16/21 2321 07/17/21 0344  BP: (!) 123/45 (!) 137/46  Pulse: 82 80  Resp: 20 20  Temp: 97.6 F (36.4 C) 97.6 F (36.4 C)  SpO2: 95% 98%   Physical Exam: Cardiac:  regular Lungs:  non labored Incisions:  left TMA dressings taken down. Some bloody drainage on dressings. Clean dressings applied Extremities:  well perfused and warm Neurologic: alert and oriented  CBC    Component Value Date/Time   WBC 12.6 (H) 07/17/2021 0050   RBC 3.74 (L) 07/17/2021 0050   HGB 12.0 (L) 07/17/2021 0050   HCT 35.8 (L) 07/17/2021 0050   PLT 315 07/17/2021 0050   MCV 95.7 07/17/2021 0050   MCH 32.1 07/17/2021 0050   MCHC 33.5 07/17/2021 0050   RDW 13.5 07/17/2021 0050   LYMPHSABS 1.3 07/14/2021 0034   MONOABS 1.1 (H) 07/14/2021 0034   EOSABS 0.1 07/14/2021 0034   BASOSABS 0.0 07/14/2021 0034    BMET    Component Value Date/Time   NA 133 (L) 07/17/2021 0050   K 4.8 07/17/2021 0050   CL 101 07/17/2021 0050   CO2 24 07/17/2021 0050   GLUCOSE 192 (H) 07/17/2021 0050   BUN 17 07/17/2021 0050   CREATININE 1.22 07/17/2021 0050   CALCIUM 9.4 07/17/2021 0050   GFRNONAA >60 07/17/2021 0050   GFRAA 88 08/30/2008 1249    INR    Component Value Date/Time   INR 1.4 09/22/2007 1200     Intake/Output Summary (Last 24 hours) at 07/17/2021 0747 Last data filed at 07/17/2021 0643 Gross per 24 hour  Intake 1507.02 ml  Output 475 ml  Net 1032.02 ml     Assessment/Plan:  75 y.o. male is s/p left TMA 1 Day Post-Op   Pain control PRN Hemodynamically stable. VSS. Afebrile Left TMA with viable flaps. Well appearing Still at high risk for more proximal amputation Ambulate with Darco shoe PT/ OT recommend [facility] services   Dory Horn [facility] [phone] 07/17/2021 7:47 AM  I have independently interviewed and  examined patient and agree with PA assessment and plan above. Picture of left tma appear satisfactory with well perfused flaps. Ok to ambulate with heel wt bearing.   Suman Trivedi C. Randie Heinz, MD [facility] [place] Office: [phone] Pager: [phone]

## 2021-07-17 NOTE — TOC Initial Note (Addendum)
Transition of Care [facility]) - Initial/Assessment Note    Patient Details  Name: [name] MRN: [id number] Date of Birth: March 14, 1946  [facility]) CM/SW Contact:    Tom-Johnson, Hershal Coria, RN Phone Number: 07/17/2021, 12:13 PM  Clinical Narrative:                 12:13- PT/OT recommends Home Health and DME needs. Patient states that he has both rolling walker and cane. Declines 3in1 when offered to. Patient agreed to home health services but will speak with wife and inform CM when decision is reached on which Agency they choose. List of Agencies from [employer] given to patient. Patient confirmed home address, correct telephone number, states he has a PCP at [facility] but could not remember the name. Wife will transport patient when he is discharged. Case Manager will check on patient later to get further information on the Agency they choose. 15:02- Patient choose [facility] and Amy notified of request. Called back and confirmed approval. States patient was active with nursing but will add PT/OT as well. Information placed on the AVS for nurse to print. Patient  verified that he has Medicare Part A and [facility] confirmed that patient is active with Dr.Vamsee Paruchuri. Discharge Summary sent [phone]). Nurse notified   Expected Discharge Plan: Home w Home Health Services Barriers to Discharge: Continued Medical Work up   Patient Goals and CMS Choice Patient states their goals for this hospitalization and ongoing recovery are:: To go home CMS Medicare.gov Compare Post Acute Care list provided to:: Patient Choice offered to / list presented to : Patient  Expected Discharge Plan and Services Expected Discharge Plan: Home w Home Health Services   Discharge Planning Services: CM Consult Post Acute Care Choice: Home Health Living arrangements for the past 2 months: Single Family Home                 DME Arranged: N/A (Patientstates he has  rolling walker and cane at home. Declines 3in1.)                    Prior Living Arrangements/Services Living arrangements for the past 2 months: Single Family Home Lives with:: Spouse Patient language and need for interpreter reviewed:: No Do you feel safe going back to the place where you live?: Yes      Need for Family Participation in Patient Care: Yes (Comment) Care giver support system in place?: Yes (comment) Current home services: DME Criminal Activity/Legal Involvement Pertinent to Current Situation/Hospitalization: No - Comment as needed  Activities of Daily Living      Permission Sought/Granted Permission sought to share information with : Case Manager, Magazine features editor Permission granted to share information with : Yes, Verbal Permission Granted              Emotional Assessment Appearance:: Appears stated age Attitude/Demeanor/Rapport: Engaged Affect (typically observed): Accepting Orientation: : Oriented to Self, Oriented to Place, Oriented to  Time, Oriented to Situation      Admission diagnosis:  Osteomyelitis (HCC) [M86.9] Left foot infection [L08.9] Patient Active Problem List   Diagnosis Date Noted   Osteomyelitis (HCC) 07/11/2021   Left foot infection    PAD (peripheral artery disease) (HCC) 06/03/2021   Adjustment disorder with mixed emotional features 03/26/2021   Bronchitis 03/26/2021   CAD (coronary artery disease) 03/26/2021   Erectile dysfunction 03/26/2021   Essential hypertension 03/26/2021   Gout 03/26/2021   PTSD (post-traumatic stress  disorder) 03/26/2021   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   Ulcer of foot (HCC) 03/26/2021   Actinic keratosis 03/26/2021   Astigmatism 03/26/2021   Bilateral cataracts 03/26/2021   Carpal tunnel syndrome 03/26/2021   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   Dermatophytosis 03/26/2021   History of other malignant neoplasm of skin 03/26/2021   Late effects  of cerebrovascular disease 03/26/2021   Low back pain 03/26/2021   Mild nonproliferative diabetic retinopathy (HCC) 03/26/2021   Observation and evaluation for suspected exposure to biological agent 03/26/2021   Pain in joint, shoulder region 03/26/2021   Postsurgical percutaneous transluminal coronary angioplasty status 03/26/2021   Presbyopia 03/26/2021   Special screening for malignant neoplasms, colon 03/26/2021   OLD MYOCARDIAL INFARCTION 12/10/2009   CAD, AUTOLOGOUS BYPASS GRAFT 12/10/2009   DYSPNEA 12/10/2009   DM (diabetes mellitus) type II, controlled, with peripheral vascular disorder (HCC) 08/06/2008   HYPERLIPIDEMIA 08/06/2008   OBESITY 08/06/2008   OBSTRUCTIVE SLEEP APNEA 08/06/2008   PCP:  [facility] Pharmacy:   [id number] [place], [place] - [address] [place] [address] Phone: [phone] Fax: [phone]     Social Determinants of Health (SDOH) Interventions    Readmission Risk Interventions No flowsheet data found.

## 2021-07-17 NOTE — Progress Notes (Addendum)
Mobility Specialist: Progress Note   07/17/21 1137  Mobility  Activity Ambulated in hall  Level of Assistance Minimal assist, patient does 75% or more  Assistive Device Front wheel walker  Distance Ambulated (ft) 150 ft  Mobility Ambulated with assistance in hallway  Mobility Response Tolerated well  Mobility performed by Mobility specialist  $Mobility charge 1 Mobility   Pre-Mobility: 82 HR, 93% SpO2 Post-Mobility: 84 HR, 133/52 BP, 95% SpO2  Pt independent with bed mobility and required minA to stand from EOB. Pt still unable to perform plantar or dorsi flexion with his L foot. Pt c/o his head feeling "foggy" halfway through ambulation so had pt go ahead back to the room. Pt otherwise asx throughout. Pt back to bed after walk with call bell and phone at his side.   [facility] Korena Nass Mobility Specialist Mobility Specialist Phone: [phone]

## 2021-07-17 NOTE — Discharge Instructions (Addendum)
You were hospitalized for osteomyelitis (bone infection) of the left forefoot. Thank you for allowing [place] to be part of your care.   We would like for you to follow up with vascular surgery and your primary care physician.   Please make sure to keep your legs elevated whenever you are sitting or lying down. Only ambulate with darco boot on. Increase activity slowly.  For your wound: Clean left foot daily with mild soap and water. Pat dry. Apply 4 x4 gauze and gauze Kerlix wrap. DO NOT soak foot in bathtub.

## 2021-07-17 NOTE — Progress Notes (Signed)
Physical Therapy Treatment Patient Details Name: [name] MRN: [id number] DOB: [birth year] Today's Date: 07/17/2021    History of Present Illness Pt is a 75 year old man who presented with L foot infection due to nonhealing wound. Imaging showed osteomyelitis of L third and fourth toes. 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 PMH: MI, DM II, HLD, PVD, CVA.    PT Comments    Pt received in supine, agreeable to therapy session and with good participation and tolerance for gait progression greater than household distances. Pt performed gait trial and transfers with cues for heel WB compliance needed throughout and min guard for safety, pt wearing darco shoe from previous admission and no overt LOB but will likely perform better with step-to pattern once thicker sole shoe obtained for his RLE to balance out leg length. Pt continues to benefit from PT services to progress toward functional mobility goals.  Continue to recommend HHPT.   Follow Up Recommendations  Home health PT;Supervision for mobility/OOB     Equipment Recommendations  Rolling walker with 5" wheels    Recommendations for Other Services       Precautions / Restrictions Precautions Precautions: Fall Required Braces or Orthoses: Other Brace Other Brace: darco shoe (forefoot offloading with stacked heel) Restrictions Other Position/Activity Restrictions: heel WBAT with darco shoe per note from Dr Randie Heinz    Mobility  Bed Mobility                    Transfers Overall transfer level: Needs assistance Equipment used: Rolling walker (2 wheeled) Transfers: Sit to/from [employer] Sit to Stand: Min guard         General transfer comment: PT provides cues for hand placement and to maintain heel WB  Ambulation/Gait Ambulation/Gait assistance: Min guard Gait Distance (Feet): 200 Feet Assistive device: Rolling walker (2 wheeled) Gait  Pattern/deviations: Step-to pattern;Step-through pattern;Decreased stride length;Decreased dorsiflexion - left;Decreased weight shift to left     General Gait Details: cues for step-to pattern with fair compliance, pt frequently stepping past LLE with RLE causing forefoot portion of shoe to tap down, needs mod cues for shorter step length on RLE and activity pacing/UE use to offload painful leg but no overt LOB or buckling.   Stairs             Wheelchair Mobility    Modified Rankin (Stroke Patients Only)       Balance Overall balance assessment: Needs assistance Sitting-balance support: No upper extremity supported;Feet supported Sitting balance-Leahy Scale: Good     Standing balance support: Single extremity supported;During functional activity Standing balance-Leahy Scale: Poor Standing balance comment: min guard for safety pt with heavy UE reliance on RW to offload surgical foot                            Cognition Arousal/Alertness: Awake/alert Behavior During Therapy: WFL for tasks assessed/performed Overall Cognitive Status: Within Functional Limits for tasks assessed                                 General Comments: A&O, he has some difficulty complying with heel WB on LLE with step-to pattern but makes effort.      Exercises      General Comments General comments (skin integrity, edema, etc.): HR 77-93 bpm with exertion, RR 26-36 rpm post-exertion,  SpO2 WNL on RA      Pertinent Vitals/Pain Pain Assessment: No/denies pain    Home Living                      Prior Function            PT Goals (current goals can now be found in the care plan section) Acute Rehab PT Goals Patient Stated Goal: to return to independent mobility PT Goal Formulation: With patient Time For Goal Achievement: 07/30/21 Potential to Achieve Goals: Good Progress towards PT goals: Progressing toward goals    Frequency    Min  3X/week      PT Plan Current plan remains appropriate    Co-evaluation              AM-PAC PT "6 Clicks" Mobility   Outcome Measure  Help needed turning from your back to your side while in a flat bed without using bedrails?: None Help needed moving from lying on your back to sitting on the side of a flat bed without using bedrails?: None Help needed moving to and from a bed to a chair (including a wheelchair)?: A Little Help needed standing up from a chair using your arms (e.g., wheelchair or bedside chair)?: A Little Help needed to walk in hospital room?: A Little Help needed climbing 3-5 steps with a railing? : A Lot 6 Click Score: 19    End of Session Equipment Utilized During Treatment: Gait belt Activity Tolerance: Patient tolerated treatment well Patient left: in chair;with call bell/phone within reach;with chair alarm set Nurse Communication: Mobility status;Other (comment) (pt may be interested in RW, was unclear; states he will not need 3 in 1) PT Visit Diagnosis: Unsteadiness on feet (R26.81);Other abnormalities of gait and mobility (R26.89)     Time: 0932-6712 PT Time Calculation (min) (ACUTE ONLY): 19 min  Charges:  $Gait Training: 8-22 mins                     Jacqui Headen P., PTA Acute Rehabilitation Services Pager: [phone] Office: [phone]    [place] 07/17/2021, 3:14 PM

## 2021-07-17 NOTE — Discharge Summary (Addendum)
Name: [name] MRN: [id number] DOB: [birth year] 75 y.o. PCP: [facility]  Date of Admission: 07/10/2021  5:53 PM Date of Discharge: 07/17/2021 Attending Physician: Inez Catalina, MD  Discharge Diagnosis: 1. Osteomyelitis of the L Foot w/ Ischemic Changes s/p LTMA  Discharge Medications: Allergies as of 07/17/2021       Reactions   Ace Inhibitors    Heart attack    Lisinopril    Heart attack        Medication List     TAKE these medications    acetaminophen 500 MG tablet Commonly known as: TYLENOL Take 1 tablet (500 mg total) by mouth every 4 (four) hours as needed for moderate pain (pain). What changed:  how much to take when to take this reasons to take this   aspirin EC 325 MG tablet Take 325 mg by mouth in the morning.   atorvastatin 40 MG tablet Commonly known as: LIPITOR Take 40 mg by mouth in the morning.   Cholecalciferol 50 MCG (2000 UT) Tabs Take 2,000 Units by mouth in the morning.   clopidogrel 75 MG tablet Commonly known as: PLAVIX Take 75 mg by mouth in the morning.   CoQ-10 200 MG Caps Take 200 mg by mouth in the morning.   DULoxetine 30 MG capsule Commonly known as: CYMBALTA Take 30 mg by mouth daily.   furosemide 40 MG tablet Commonly known as: LASIX Take 40 mg by mouth in the morning.   GREEN TEA EXTRACT PO Take 400 mg by mouth 2 (two) times a day.   HYDROcodone-acetaminophen 5-325 MG tablet Commonly known as: NORCO/VICODIN Take 1 tablet by mouth every 4 (four) hours as needed for up to 5 days for moderate pain or severe pain. What changed:  when to take this reasons to take this   insulin aspart protamine- aspart (70-30) 100 UNIT/ML injection Commonly known as: NOVOLOG MIX 70/30 Inject 30 Units into the skin in the morning and at bedtime.   metFORMIN 1000 MG tablet Commonly known as: GLUCOPHAGE Take 1,000 mg by mouth in the morning and at bedtime.   metoprolol tartrate 50 MG tablet Commonly known as:  LOPRESSOR Take 50 mg by mouth 2 (two) times daily.   multivitamin with minerals Tabs tablet Take 1 tablet by mouth daily. Men's 50+ What changed: when to take this   Neuriva Caps Take 1 capsule by mouth at bedtime.   nitroGLYCERIN 0.4 MG SL tablet Commonly known as: NITROSTAT Place 0.4 mg under the tongue every 5 (five) minutes x 3 doses as needed for chest pain.   OVER THE COUNTER MEDICATION Take 1 tablet by mouth 3 (three) times daily with meals. Sugar Blockers   Ozempic (1 MG/DOSE) 4 MG/3ML Sopn Generic drug: Semaglutide (1 MG/DOSE) Inject 1 mg into the skin every Monday.   Resveratrol 250 MG Caps Take 250 mg by mouth daily with lunch.   traZODone 50 MG tablet Commonly known as: DESYREL Take 25 mg by mouth at bedtime.               Durable Medical Equipment  (From admission, onward)           Start     Ordered   07/17/21 0719  For home use only DME Walker rolling  Once       Comments: 5 wheel RW  Question Answer Comment  Walker: With 5 Inch Wheels   Patient needs a walker to treat with the following condition History of  transmetatarsal amputation of left foot (HCC)      07/17/21 0719              Discharge Care Instructions  (From admission, onward)           Start     Ordered   07/17/21 0000  Discharge wound care:       Comments: Clean left foot daily with mild soap and water. Pat dry. Apply 4 x4 gauze and gauze Kerlix wrap. DO NOT soak foot in bathtub   07/17/21 1134            Disposition and follow-up:   [name] was discharged from [facility] in Stable condition.  At the hospital follow up visit please address:  1.  Osteomyelitis of the L Foot w/ Ischemic Changes s/p LTMA: Patient presented with pain, swelling erythema of L forefoot. Xray left foot showed erosions consistent with OM. Broad spec abx started. No signs of systemic infection, blood cultures negative. Patient had L TMA with vascular  surgery and tolerated it well. Abx were discontinued after post op day 1 given source control. Patient will need to follow up with vascular surgery after discharge.   2.  Labs / imaging needed at time of follow-up: none  3.  Pending labs/ test needing follow-up: none  Follow-up Appointments:  Follow-up Information     [facility] Follow up.   Why: 2-3 weeks. The office will call the patient with an appointment, For wound re-check Contact information: [address] [place] [address] [phone]        [facility]. Call in 1 week(s).   Specialty: General Practice Why: Please call your primary care physician's office in 1-2weeks to make an appointment for a hospital follow up visit. Contact information: [address] [place] [address] [phone]                 Hospital Course by problem list:  Osteomyelitis of the L Foot w/ Ischemic Changes s/p LTMA PAD/PVD T2DM [name] is a 75 year old male with a past medical history of type 2 diabetes mellitus, PVD, hypertension, CAD, and prior left foot fourth digit amputation who presented with foot pain and was admitted for treatment of osteomyelitis of his left foot. His symptoms included pain, swelling, warmth, erythema of the left forefoot, and darkening of the 3th toe started in March after his 4th toe on the left was amputated (photos in media). The wound had not healed as was expected. He trialed courses of PO antibiotics with vascular surgery who saw him at follow up for toe amputation without resolution of symptoms. Vascular recommended patient present to the ED for IV antibiotics and further evaluation by vascular surgery. Patient had xrays of feet which showed erosions consistent with osteomyelitis. Patient was put on broad spectrum antibiotics: vanc, cefepime, fagyl. Blood cultures with NGTD. Patient has hx of PAD. HE underwent an angiogram 7/25 with vascular  surgery which showed greater than 50% stenosis of the anterior tibial artery at multiple locations. No intervention for revascularization was done. Patient was deemed a candidate for L transmetatarsal amputation, though he is high risk for requiring future transtibial amputation. Patient had L transmetatarsal amputation on 07/16/21 with Dr. Randie Heinz. No tissue culture was collected. Patient tolerated procedure well. He was doing bedside exercises, is having bowel movements post op, eating well. Antibiotics were discontinued after one day post op given source control. Patient can ambulate with  darco brace. We prescribed pain medication for 5 days post op. Patient discharged on 7/28. He will need to follow up with vascular surgery outpatient.  COVID 19-positive test Asymptomatic on presentation.  Incidental finding on admission.  Patient reports testing positive back in June, unable to see these results patient denies any signs or symptoms of COVID infection. Test likely still positive after recent infection and recovery. No further medical management was required.  Type 2 Diabetes Myelitis Home medications include Ozempic weekly, metformin 1000 mg twice daily and NovoLog Mix 70/30 30 units twice daily.  Patient was put on 22 Lantus qHS and SSI though increased to 25 Lantus qHS after elevated BG in the setting of recent surgery. Also required novolog 20. Patient will discharge on home medications.  Anxiety Depression Patient has history of depression and anxiety. Patient was continued on the following home medications: trazodone, duloxetine. Citalopram was held to prevent risk of serotonin syndrome. Restarted all home meds (except citalopram) after discharge. No further medical management was required.  Would recommend to discuss with primary care provider regarding dosage of both SSRIs.    Hypertension History of Myocardial Infarction Hyperlipidemia Home medication metoprolol 50 mg twice daily was held due  to soft pressures in the setting of infection, then started at once daily. Will resume home metoprolol dose at discharge. Patient was continued on atorvastatin 40mg  qd, aspirin 81mg  qd. No further medical management was required.  LE Edema Patient is on furosemide 40 mg daily, presumably for lower extremity edema. Holding in the setting of normotensive blood pressures. No documented hx of heart failure. Last echo in our system from 2005 showed EF 50-55%. No further medical management was required.  Subjective on day of discharge: Overnight: no acute events or concerns overnight.  Patient reports he feels well today, is happy to be feeling better now that he has had his amputation. He endorses having gotten up out of bed with PT, having BMs since surgery, eating well. His pain is well managed on PRN regimen. He asks about how long he needs to elevate his legs at home per day. He is pleased to hear he could be going home today.  Discharge Exam:   BP (!) 133/52 (BP Location: Left Arm)   Pulse 82   Temp 97.9 F (36.6 C) (Oral)   Resp 20   Ht 5\' 8"  (1.727 m)   Wt 94.9 kg   SpO2 96%   BMI 31.81 kg/m   Physical Exam General: alert, appears stated age, in no acute distress HEENT: Normocephalic, atraumatic, conjunctiva normal CV: Regular rate and rhythm, no murmurs rubs or gallops Pulm: Clear to auscultation anteriorly bilaterally, normal work of breathing on RA Abdomen: Soft, nondistended, bowel sounds present, no tenderness to palpation MSK: dressing on L TMA appears clean Skin: Erythema and warmth of the left forefoot, Right groin puncture site without erythema or bleeding Neuro: Alert and oriented x3  Pertinent Labs, Studies, and Procedures:  CBC Latest Ref Rng & Units 07/17/2021 07/16/2021 07/15/2021  WBC 4.0 - 10.5 K/uL 12.6(H) 8.6 8.4  Hemoglobin 13.0 - 17.0 g/dL 12.0(L) 11.5(L) 11.5(L)  Hematocrit 39.0 - 52.0 % 35.8(L) 35.1(L) 35.0(L)  Platelets 150 - 400 K/uL 315 265 267   BMP  Latest Ref Rng & Units 07/17/2021 07/16/2021 07/15/2021  Glucose 70 - 99 mg/dL 045(W192(H) 098(J118(H) 191(Y168(H)  BUN 8 - 23 mg/dL 17 10 12   Creatinine 0.61 - 1.24 mg/dL 7.821.22 9.561.00 2.130.92  Sodium 135 - 145 mmol/L 133(L) 137 136  Potassium 3.5 - 5.1 mmol/L 4.8 4.0 4.2  Chloride 98 - 111 mmol/L 101 109 104  CO2 22 - 32 mmol/L 24 24 25   Calcium 8.9 - 10.3 mg/dL 9.4 ) 9.3   DG Foot Complete Left  Result Date: 07/11/2021 CLINICAL DATA:  Left foot infection. EXAM: LEFT FOOT - COMPLETE 3+ VIEW COMPARISON:  07/09/2021 FINDINGS: Stable from 2 days ago. Erosions at the third and fourth metatarsal heads and at the base of the third proximal phalanx. The fourth toe has been amputated. Generalized osteopenia and arterial calcification. No opaque foreign body or soft tissue emphysema. IMPRESSION: 1. Stable from 2 days ago. 2. Erosion/osteomyelitis of the third and fourth metatarsal heads and the third proximal phalanx. Electronically Signed   By: 07/11/2021 M.D.   On: 07/11/2021 08:53    CHEST - 2 VIEW 07/09/2021 IMPRESSION: No active cardiopulmonary disease By: 07/11/2021 M.D.  ABDOMINAL AORTOGRAM W/LOWER EXTREMITY Findings: Aorta and iliac segments are free of flow-limiting stenosis.  Bilateral renal arteries are patent with no stenosis.  Left common femoral and SFA are patent.  Popliteal artery is patent without stenosis.  Below the knee he has severe disease.  Posterior tibial is inline to the foot however is quite a diminutive vessel.  The peroneal artery occludes has multiple areas of occlusion and reconstitution but does not go past the ankle.  The anterior tibial artery has multiple greater than 50% stenosis however it also gives out at the level of the ankle and no intervention was undertaken. Brandon C. Deatra Robinson, MD  Discharge Instructions: Discharge Instructions     Call MD for:  redness, tenderness, or signs of infection (pain, swelling, redness, odor or green/yellow discharge around incision site)    Complete by: As directed    Call MD for:  severe uncontrolled pain   Complete by: As directed    Call MD for:  temperature >100.4   Complete by: As directed    Diet - low sodium heart healthy   Complete by: As directed    Discharge instructions   Complete by: As directed    Heel weight bearing only left foot. Wear Darco shoe when ambulating   Discharge wound care:   Complete by: As directed    Clean left foot daily with mild soap and water. Pat dry. Apply 4 x4 gauze and gauze Kerlix wrap. DO NOT soak foot in bathtub   Increase activity slowly   Complete by: As directed        Signed: Randie Heinz, MD 07/17/2021, 1:35 PM   Pager: [phone]

## 2021-07-18 ENCOUNTER — Ambulatory Visit: Payer: No Typology Code available for payment source | Admitting: Vascular Surgery

## 2021-07-30 NOTE — ED Provider Notes (Signed)
[facility] 4E CV SURGICAL PROGRESSIVE CARE Provider Note   CSN: [id number] Arrival date & time: 07/10/21  1501     History Chief Complaint  Patient presents with   Foot Pain    [name] is a 75 y.o. male.  Patient presents to ER for complaint of left foot pain.  States he is in worsening pain for the past 2 to 3 days.  He has a history of diabetes, osteomyelitis and prior to amputation for pain.  No reports of fevers or cough or vomiting or diarrhea.      Past Medical History:  Diagnosis Date   CAD, AUTOLOGOUS BYPASS GRAFT    Depression    DIABETES, TYPE 2    DYSPNEA    on exertion   HYPERLIPIDEMIA    OBESITY    OBSTRUCTIVE SLEEP APNEA    cpap   Old myocardial infarction 2005   Peripheral vascular disease (HCC)    PTSD (post-traumatic stress disorder)    Stroke (HCC) 09/2007   light stroke    Patient Active Problem List   Diagnosis Date Noted   Status post transmetatarsal amputation of left foot (HCC)    Osteomyelitis (HCC) 07/11/2021   Left foot infection    PAD (peripheral artery disease) (HCC) 06/03/2021   Adjustment disorder with mixed emotional features 03/26/2021   Bronchitis 03/26/2021   CAD (coronary artery disease) 03/26/2021   Erectile dysfunction 03/26/2021   Essential hypertension 03/26/2021   Gout 03/26/2021   PTSD (post-traumatic stress disorder) 03/26/2021   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   Ulcer of foot (HCC) 03/26/2021   Actinic keratosis 03/26/2021   Astigmatism 03/26/2021   Bilateral cataracts 03/26/2021   Carpal tunnel syndrome 03/26/2021   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   Dermatophytosis 03/26/2021   History of other malignant neoplasm of skin 03/26/2021   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   Low back pain 03/26/2021   Mild nonproliferative diabetic retinopathy (HCC) 03/26/2021   Observation and evaluation for suspected exposure to biological agent 03/26/2021   Pain in joint,  shoulder region 03/26/2021   Postsurgical percutaneous transluminal coronary angioplasty status 03/26/2021   Presbyopia 03/26/2021   Special screening for malignant neoplasms, colon 03/26/2021   OLD MYOCARDIAL INFARCTION 12/10/2009   CAD, AUTOLOGOUS BYPASS GRAFT 12/10/2009   DYSPNEA 12/10/2009   DM (diabetes mellitus) type II, controlled, with peripheral vascular disorder (HCC) 08/06/2008   HYPERLIPIDEMIA 08/06/2008   OBESITY 08/06/2008   OBSTRUCTIVE SLEEP APNEA 08/06/2008    Past Surgical History:  Procedure Laterality Date   ABDOMINAL AORTOGRAM W/LOWER EXTREMITY Bilateral 03/10/2021   Procedure: ABDOMINAL AORTOGRAM W/LOWER EXTREMITY;  Surgeon: Maeola Harmanain, Brandon Christopher, MD;  [facility];  Service: Cardiovascular;  Laterality: Bilateral;   ABDOMINAL AORTOGRAM W/LOWER EXTREMITY N/A 07/14/2021   Procedure: ABDOMINAL AORTOGRAM W/LOWER EXTREMITY;  Surgeon: Maeola Harmanain, Brandon Christopher, MD;  [facility];  Service: Cardiovascular;  Laterality: N/A;   AMPUTATION Left 03/18/2021   Procedure: LEFT FOURTH TOE AMPUTATION;  Surgeon: Maeola Harmanain, Brandon Christopher, MD;  [facility];  Service: Vascular;  Laterality: Left;   APPENDECTOMY  1965   CORONARY ARTERY BYPASS GRAFT  2008   at [place]   cyst remove on lelt leg,posterior     LOWER EXTREMITY ANGIOGRAPHY N/A 03/07/2018   Procedure: LOWER EXTREMITY ANGIOGRAPHY;  Surgeon: Maeola Harmanain, Brandon Christopher, MD;  [facility];  Service: Cardiovascular;  Laterality: N/A;   LOWER EXTREMITY ANGIOGRAPHY Bilateral 05/29/2019   Procedure: LOWER EXTREMITY  ANGIOGRAPHY;  Surgeon: Maeola Harman, MD;  Location: [facility] INVASIVE CV LAB;  Service: Cardiovascular;  Laterality: Bilateral;   PERIPHERAL VASCULAR ATHERECTOMY Right 03/07/2018   Procedure: PERIPHERAL VASCULAR ATHERECTOMY;  Surgeon: Maeola Harman, MD;  Location: [facility] INVASIVE CV LAB;  Service: Cardiovascular;  Laterality: Right;  anterioer tibial    TRANSMETATARSAL AMPUTATION Left 07/16/2021   Procedure: LEFT TRANSMETATARSAL AMPUTATION;  Surgeon: Maeola Harman, MD;  [facility];  Service: Vascular;  Laterality: Left;   VASECTOMY     WOUND DEBRIDEMENT Left 05/02/2021   Procedure: Left 4th toe amputation site debridement including metatarsal head;  Surgeon: Cephus Shelling, MD;  [facility];  Service: Vascular;  Laterality: Left;       Family History  Problem Relation Age of Onset   Diabetes Mother    Alcoholism Father     Social History   Tobacco Use   Smoking status: Former    Types: Pipe   Smokeless tobacco: Never  Vaping Use   Vaping Use: Never used  Substance Use Topics   Alcohol use: Never   Drug use: Never    Home Medications Prior to Admission medications   Medication Sig Start Date End Date Taking? Authorizing Provider  aspirin EC 325 MG tablet Take 325 mg by mouth in the morning.   Yes [provider]  atorvastatin (LIPITOR) 40 MG tablet Take 40 mg by mouth in the morning.   Yes [provider]  Cholecalciferol 2000 units TABS Take 2,000 Units by mouth in the morning.   Yes [provider]  clopidogrel (PLAVIX) 75 MG tablet Take 75 mg by mouth in the morning.   Yes [provider]  Coenzyme Q10 (COQ-10) 200 MG CAPS Take 200 mg by mouth in the morning.   Yes [provider]  DULoxetine (CYMBALTA) 30 MG capsule Take 30 mg by mouth daily.   Yes [provider]  furosemide (LASIX) 40 MG tablet Take 40 mg by mouth in the morning.   Yes [provider]  [place] Tea, Camellia sinensis, (GREEN TEA EXTRACT PO) Take 400 mg by mouth 2 (two) times a day.   Yes [provider]  insulin aspart protamine- aspart (NOVOLOG MIX 70/30) (70-30) 100 UNIT/ML injection Inject 30 Units into the skin in the morning and at bedtime.   Yes [provider]  metFORMIN (GLUCOPHAGE) 1000 MG tablet Take 1,000 mg by mouth in the morning and at  bedtime.   Yes [provider]  metoprolol tartrate (LOPRESSOR) 50 MG tablet Take 50 mg by mouth 2 (two) times daily.   Yes [provider]  Misc Natural Products (NEURIVA) CAPS Take 1 capsule by mouth at bedtime.   Yes [provider]  nitroGLYCERIN (NITROSTAT) 0.4 MG SL tablet Place 0.4 mg under the tongue every 5 (five) minutes x 3 doses as needed for chest pain.   Yes [provider]  OVER THE COUNTER MEDICATION Take 1 tablet by mouth 3 (three) times daily with meals. Sugar Blockers   Yes [provider]  Resveratrol 250 MG CAPS Take 250 mg by mouth daily with lunch.   Yes [provider]  Semaglutide, 1 MG/DOSE, (OZEMPIC, 1 MG/DOSE,) 4 MG/3ML SOPN Inject 1 mg into the skin every Monday.   Yes [provider]  traZODone (DESYREL) 50 MG tablet Take 25 mg by mouth at bedtime.    Yes [provider]  acetaminophen (TYLENOL) 500 MG tablet Take 1 tablet (500 mg total)  by mouth every 4 (four) hours as needed for moderate pain (pain). 07/17/21   Ellison Carwin, MD  Multiple Vitamin (MULTIVITAMIN WITH MINERALS) TABS tablet Take 1 tablet by mouth daily. Men's 50+ 07/17/21   Ellison Carwin, MD    Allergies    Ace inhibitors and Lisinopril  Review of Systems   Review of Systems  Constitutional:  Negative for fever.  HENT:  Negative for ear pain and sore throat.   Eyes:  Negative for pain.  Respiratory:  Negative for cough.   Cardiovascular:  Negative for chest pain.  Gastrointestinal:  Negative for abdominal pain.  Genitourinary:  Negative for flank pain.  Musculoskeletal:  Negative for back pain.  Skin:  Negative for color change and rash.  Neurological:  Negative for syncope.  All other systems reviewed and are negative.  Physical Exam Updated Vital Signs BP (!) 111/35 (BP Location: Left Arm)   Pulse 83   Temp 97.6 F (36.4 C) (Oral)   Resp 19   Ht  (1.727 m)   Wt 94.9 kg   SpO2 95%   BMI 31.81 kg/m    Physical Exam Constitutional:      Appearance: He is well-developed.  HENT:     Head: Normocephalic.     Nose: Nose normal.  Eyes:     Extraocular Movements: Extraocular movements intact.  Cardiovascular:     Rate and Rhythm: Normal rate.  Pulmonary:     Effort: Pulmonary effort is normal.  Musculoskeletal:     Comments: Left foot ulcerative erosive changes to the third digit.  Skin:    Coloration: Skin is not jaundiced.  Neurological:     Mental Status: He is alert. Mental status is at baseline.    ED Results / Procedures / Treatments   Labs (all labs ordered are listed, but only abnormal results are displayed) Labs Reviewed  RESP PANEL BY RT-PCR (FLU A&B, COVID) ARPGX2 - Abnormal; Notable for the following components:      Result Value   SARS Coronavirus 2 by RT PCR POSITIVE (*)    All other components within normal limits  COMPREHENSIVE METABOLIC PANEL - Abnormal; Notable for the following components:   Sodium 133 (*)    Glucose, Bld 120 (*)    Creatinine, Ser 1.37 (*)    Albumin 3.1 (*)    GFR, Estimated 54 (*)    All other components within normal limits  CBC WITH DIFFERENTIAL/PLATELET - Abnormal; Notable for the following components:   WBC 13.3 (*)    RBC 4.08 (*)    Hemoglobin 12.7 (*)    Neutro Abs 9.8 (*)    Monocytes Absolute 1.5 (*)    All other components within normal limits  HEMOGLOBIN A1C - Abnormal; Notable for the following components:   Hgb A1c MFr Bld 7.2 (*)    All other components within normal limits  CBC - Abnormal; Notable for the following components:   RBC 3.65 (*)    Hemoglobin 11.6 (*)    HCT 34.7 (*)    All other components within normal limits  COMPREHENSIVE METABOLIC PANEL - Abnormal; Notable for the following components:   Sodium 134 (*)    Glucose, Bld 244 (*)    Total Protein 6.3 (*)    Albumin 2.6 (*)    All other components within normal limits  GLUCOSE, CAPILLARY - Abnormal; Notable for the following components:    Glucose-Capillary 210 (*)    All other components within normal limits  GLUCOSE,  CAPILLARY - Abnormal; Notable for the following components:   Glucose-Capillary 231 (*)    All other components within normal limits  GLUCOSE, CAPILLARY - Abnormal; Notable for the following components:   Glucose-Capillary 181 (*)    All other components within normal limits  GLUCOSE, CAPILLARY - Abnormal; Notable for the following components:   Glucose-Capillary 235 (*)    All other components within normal limits  GLUCOSE, CAPILLARY - Abnormal; Notable for the following components:   Glucose-Capillary 216 (*)    All other components within normal limits  GLUCOSE, CAPILLARY - Abnormal; Notable for the following components:   Glucose-Capillary 175 (*)    All other components within normal limits  GLUCOSE, CAPILLARY - Abnormal; Notable for the following components:   Glucose-Capillary 267 (*)    All other components within normal limits  GLUCOSE, CAPILLARY - Abnormal; Notable for the following components:   Glucose-Capillary 202 (*)    All other components within normal limits  CBC WITH DIFFERENTIAL/PLATELET - Abnormal; Notable for the following components:   WBC 10.9 (*)    RBC 4.14 (*)    Hemoglobin 12.9 (*)    Neutro Abs 8.3 (*)    Monocytes Absolute 1.1 (*)    All other components within normal limits  BASIC METABOLIC PANEL - Abnormal; Notable for the following components:   Glucose, Bld 126 (*)    All other components within normal limits  GLUCOSE, CAPILLARY - Abnormal; Notable for the following components:   Glucose-Capillary 146 (*)    All other components within normal limits  GLUCOSE, CAPILLARY - Abnormal; Notable for the following components:   Glucose-Capillary 147 (*)    All other components within normal limits  GLUCOSE, CAPILLARY - Abnormal; Notable for the following components:   Glucose-Capillary 107 (*)    All other components within normal limits  GLUCOSE, CAPILLARY - Abnormal;  Notable for the following components:   Glucose-Capillary 111 (*)    All other components within normal limits  GLUCOSE, CAPILLARY - Abnormal; Notable for the following components:   Glucose-Capillary 200 (*)    All other components within normal limits  BASIC METABOLIC PANEL - Abnormal; Notable for the following components:   Glucose, Bld 168 (*)    All other components within normal limits  CBC - Abnormal; Notable for the following components:   RBC 3.67 (*)    Hemoglobin 11.5 (*)    HCT 35.0 (*)    All other components within normal limits  GLUCOSE, CAPILLARY - Abnormal; Notable for the following components:   Glucose-Capillary 261 (*)    All other components within normal limits  GLUCOSE, CAPILLARY - Abnormal; Notable for the following components:   Glucose-Capillary 133 (*)    All other components within normal limits  GLUCOSE, CAPILLARY - Abnormal; Notable for the following components:   Glucose-Capillary 224 (*)    All other components within normal limits  GLUCOSE, CAPILLARY - Abnormal; Notable for the following components:   Glucose-Capillary 208 (*)    All other components within normal limits  GLUCOSE, CAPILLARY - Abnormal; Notable for the following components:   Glucose-Capillary 176 (*)    All other components within normal limits  GLUCOSE, CAPILLARY - Abnormal; Notable for the following components:   Glucose-Capillary 116 (*)    All other components within normal limits  CBC - Abnormal; Notable for the following components:   RBC 3.64 (*)    Hemoglobin 11.5 (*)    HCT 35.1 (*)    All other  components within normal limits  BASIC METABOLIC PANEL - Abnormal; Notable for the following components:   Glucose, Bld 118 (*)    Calcium 8.7 (*)    Anion gap 4 (*)    All other components within normal limits  GLUCOSE, CAPILLARY - Abnormal; Notable for the following components:   Glucose-Capillary 122 (*)    All other components within normal limits  GLUCOSE, CAPILLARY -  Abnormal; Notable for the following components:   Glucose-Capillary 179 (*)    All other components within normal limits  GLUCOSE, CAPILLARY - Abnormal; Notable for the following components:   Glucose-Capillary 322 (*)    All other components within normal limits  CBC - Abnormal; Notable for the following components:   WBC 12.6 (*)    RBC 3.74 (*)    Hemoglobin 12.0 (*)    HCT 35.8 (*)    All other components within normal limits  BASIC METABOLIC PANEL - Abnormal; Notable for the following components:   Sodium 133 (*)    Glucose, Bld 192 (*)    All other components within normal limits  GLUCOSE, CAPILLARY - Abnormal; Notable for the following components:   Glucose-Capillary 423 (*)    All other components within normal limits  GLUCOSE, CAPILLARY - Abnormal; Notable for the following components:   Glucose-Capillary 381 (*)    All other components within normal limits  GLUCOSE, CAPILLARY - Abnormal; Notable for the following components:   Glucose-Capillary 242 (*)    All other components within normal limits  GLUCOSE, CAPILLARY - Abnormal; Notable for the following components:   Glucose-Capillary 152 (*)    All other components within normal limits  GLUCOSE, CAPILLARY - Abnormal; Notable for the following components:   Glucose-Capillary 198 (*)    All other components within normal limits  GLUCOSE, CAPILLARY - Abnormal; Notable for the following components:   Glucose-Capillary 222 (*)    All other components within normal limits  CBG MONITORING, ED - Abnormal; Notable for the following components:   Glucose-Capillary 213 (*)    All other components within normal limits  CULTURE, BLOOD (ROUTINE X 2)  CULTURE, BLOOD (ROUTINE X 2)  SURGICAL PCR SCREEN  LACTIC ACID, PLASMA    EKG EKG Interpretation  Date/Time:  Friday July 11 2021 12:24:16 EDT Ventricular Rate:  94 PR Interval:  173 QRS Duration: 79 QT Interval:  337 QTC Calculation: 422 R Axis:   79 Text  Interpretation: Sinus rhythm Nonspecific repol abnormality, diffuse leads Baseline wander in lead(s) V1 When compared with ECG of 03/10/2021, REPOLARIZATION ABNORMALITY is slightly more prominent Confirmed by Dione Booze ([id number]) on 07/11/2021 4:00:36 PM  Radiology No results found.  Procedures Procedures   Medications Ordered in ED Medications  0.9 %  sodium chloride infusion (75 mL/hr Intravenous Rate/Dose Change 07/14/21 1058)  ondansetron (ZOFRAN) injection 4 mg (4 mg Intravenous Given 07/11/21 0803)  piperacillin-tazobactam (ZOSYN) IVPB 3.375 g (0 g Intravenous Stopped 07/11/21 1005)  vancomycin (VANCOREADY) IVPB 2000 mg/400 mL (0 mg Intravenous Stopped 07/11/21 1309)  fentaNYL (SUBLIMAZE) injection 50 mcg (50 mcg Intravenous Given 07/11/21 0806)  sodium chloride 0.9 % bolus 1,000 mL (0 mLs Intravenous Stopped 07/11/21 0929)  HYDROmorphone (DILAUDID) injection 1 mg (1 mg Intravenous Given 07/11/21 2250)  HYDROmorphone (DILAUDID) injection 0.5 mg (0.5 mg Intravenous Given 07/13/21 0242)  HYDROmorphone (DILAUDID) injection 0.5 mg (0.5 mg Intravenous Given 07/13/21 1045)  insulin aspart (novoLOG) injection 20 Units (20 Units Subcutaneous Given 07/16/21 2217)  ramelteon (ROZEREM) tablet 8 mg (8  mg Oral Given 07/17/21 0022)    ED Course  I have reviewed the triage vital signs and the nursing notes.  Pertinent labs & imaging results that were available during my care of the patient were reviewed by me and considered in my medical decision making (see chart for details).    MDM Rules/Calculators/A&P                           Ancillary studies ordered.  X-ray is concerning for recurrent osteomyelitis.  Surgical specialist consulted.  Patient admitted to medicine.  Broad-spectrum IV antibiotic started.  Final Clinical Impression(s) / ED Diagnoses Final diagnoses:  Left foot infection  Status post transmetatarsal amputation of left foot (HCC)  Diabetic osteomyelitis (HCC)    Rx / DC  Orders ED Discharge Orders          Ordered    Increase activity slowly        07/17/21 1134    Diet - low sodium heart healthy        07/17/21 1134    Discharge wound care:       Comments: Clean left foot daily with mild soap and water. Pat dry. Apply 4 x4 gauze and gauze Kerlix wrap. DO NOT soak foot in bathtub   07/17/21 1134    Call MD for:  temperature >100.4        07/17/21 1134    Call MD for:  severe uncontrolled pain        07/17/21 1134    Call MD for:  redness, tenderness, or signs of infection (pain, swelling, redness, odor or green/yellow discharge around incision site)        07/17/21 1134    Discharge instructions       Comments: Heel weight bearing only left foot. Wear Darco shoe when ambulating   07/17/21 1134    HYDROcodone-acetaminophen (NORCO/VICODIN) 5-325 MG tablet  Every 4 hours PRN        07/17/21 1335    acetaminophen (TYLENOL) 500 MG tablet  Every 4 hours PRN        07/17/21 1335    Multiple Vitamin (MULTIVITAMIN WITH MINERALS) TABS tablet  Daily        07/17/21 1335    Increase activity slowly        07/17/21 1337    Diet - low sodium heart healthy        07/17/21 1337    Discharge wound care:       Comments: Clean left foot daily with mild soap and water. Pat dry. Apply 4 x4 gauze and gauze Kerlix wrap. DO NOT soak foot in bathtub   07/17/21 1337    Call MD for:  temperature >100.4        07/17/21 1337    Call MD for:  severe uncontrolled pain        07/17/21 1337    Call MD for:  redness, tenderness, or signs of infection (pain, swelling, redness, odor or green/yellow discharge around incision site)        07/17/21 1337    Home Health        07/17/21 1343    Face-to-face encounter (required for Medicare/Medicaid patients)       Comments: I Ellison Carwin certify that this patient is under my care and that I, or a nurse practitioner or physician's assistant working with me, had a face-to-face encounter that meets the physician face-to-face encounter  requirements  with this patient on 07/17/2021. The encounter with the patient was in whole, or in part for the following medical condition(s) which is the primary reason for home health care (List medical condition): s/p TMA   07/17/21 1343    Increase activity slowly        07/17/21 1352    Discharge wound care:       Comments: Clean left foot daily with mild soap and water. Pat dry. Apply 4 x4 gauze and gauze Kerlix wrap. DO NOT soak foot in bathtub   07/17/21 1352    Call MD for:  temperature >100.4        07/17/21 1352    Call MD for:  severe uncontrolled pain        07/17/21 1352    Call MD for:  redness, tenderness, or signs of infection (pain, swelling, redness, odor or green/yellow discharge around incision site)        07/17/21 1352    Increase activity slowly        07/17/21 1529    No wound care        07/17/21 1529             Cheryll Cockayne, MD 07/30/21 [phone]

## 2021-08-01 ENCOUNTER — Ambulatory Visit (INDEPENDENT_AMBULATORY_CARE_PROVIDER_SITE_OTHER): Payer: No Typology Code available for payment source | Admitting: Physician Assistant

## 2021-08-01 ENCOUNTER — Encounter: Payer: Self-pay | Admitting: Physician Assistant

## 2021-08-01 ENCOUNTER — Other Ambulatory Visit: Payer: Self-pay

## 2021-08-01 VITALS — BP 120/58 | HR 82 | Ht 68.0 in | Wt 209.5 lb

## 2021-08-01 DIAGNOSIS — I739 Peripheral vascular disease, unspecified: Secondary | ICD-10-CM

## 2021-08-01 NOTE — Progress Notes (Addendum)
POST OPERATIVE OFFICE NOTE    CC:  F/u for surgery  HPI:  This is a 75 y.o. male who was   osteomyelitis base of left third and fourth toes. s/p Left foot transmetatarsal amputation on 07/16/21 by Dr. Randie Heinz.  He is here today for incisional check and suture removal.    Pt returns today for follow up.  Pt states he has no fever or chills.  He elevates his foot daily and is heel weight bearing in a Darco shoe.  Allergies  Allergen Reactions   Ace Inhibitors     Heart attack    Lisinopril     Heart attack    Current Outpatient Medications  Medication Sig Dispense Refill   acetaminophen (TYLENOL) 500 MG tablet Take 1 tablet (500 mg total) by mouth every 4 (four) hours as needed for moderate pain (pain). 30 tablet 0   aspirin EC 325 MG tablet Take 325 mg by mouth in the morning.     atorvastatin (LIPITOR) 40 MG tablet Take 40 mg by mouth in the morning.     Cholecalciferol 2000 units TABS Take 2,000 Units by mouth in the morning.     clopidogrel (PLAVIX) 75 MG tablet Take 75 mg by mouth in the morning.     Coenzyme Q10 (COQ-10) 200 MG CAPS Take 200 mg by mouth in the morning.     DULoxetine (CYMBALTA) 30 MG capsule Take 30 mg by mouth daily.     furosemide (LASIX) 40 MG tablet Take 40 mg by mouth in the morning.     Green Tea, Camellia sinensis, (GREEN TEA EXTRACT PO) Take 400 mg by mouth 2 (two) times a day.     insulin aspart protamine- aspart (NOVOLOG MIX 70/30) (70-30) 100 UNIT/ML injection Inject 30 Units into the skin in the morning and at bedtime.     metFORMIN (GLUCOPHAGE) 1000 MG tablet Take 1,000 mg by mouth in the morning and at bedtime.     metoprolol tartrate (LOPRESSOR) 50 MG tablet Take 50 mg by mouth 2 (two) times daily.     Misc Natural Products (NEURIVA) CAPS Take 1 capsule by mouth at bedtime.     Multiple Vitamin (MULTIVITAMIN WITH MINERALS) TABS tablet Take 1 tablet by mouth daily. Men's 50+ 30 tablet 0   nitroGLYCERIN (NITROSTAT) 0.4 MG SL tablet Place 0.4 mg under  the tongue every 5 (five) minutes x 3 doses as needed for chest pain.     OVER THE COUNTER MEDICATION Take 1 tablet by mouth 3 (three) times daily with meals. Sugar Blockers     Resveratrol 250 MG CAPS Take 250 mg by mouth daily with lunch.     Semaglutide, 1 MG/DOSE, (OZEMPIC, 1 MG/DOSE,) 4 MG/3ML SOPN Inject 1 mg into the skin every Monday.     traZODone (DESYREL) 50 MG tablet Take 25 mg by mouth at bedtime.      No current facility-administered medications for this visit.     ROS:  See HPI  Physical Exam:       Incision:  Healing without frank signs of infection Extremities:  warm with intact sensation Suture removed with pin point bleeding dry dressing applied over xeroform    Assessment/Plan:  This is a 75 y.o. male who is s/p:Left foot transmetatarsal amputation Sutres were removed today patient tolerated this well.    Shower daily, dry dressing, heel weight bearing and elevation when at rest.  He will f/u in 2-3 weeks for a incision check.  He  will call if he has questions or concerns.  He brought in paper work for a temporary handicap placard.  I filled it out for 6 months and asked him to ask his primary care physician if he needs a permanent one in the future.  He states he can't do much activity more so secondary to his heart once he heals this amputation.  Mosetta Pigeon PA-C [facility] [phone]   Call MD:  Myra Gianotti

## 2021-08-12 ENCOUNTER — Other Ambulatory Visit: Payer: Self-pay

## 2021-08-12 ENCOUNTER — Telehealth: Payer: Self-pay

## 2021-08-12 ENCOUNTER — Ambulatory Visit (INDEPENDENT_AMBULATORY_CARE_PROVIDER_SITE_OTHER): Payer: No Typology Code available for payment source | Admitting: Physician Assistant

## 2021-08-12 VITALS — BP 134/64 | HR 97 | Temp 98.6°F | Ht 67.0 in | Wt 213.6 lb

## 2021-08-12 DIAGNOSIS — I739 Peripheral vascular disease, unspecified: Secondary | ICD-10-CM

## 2021-08-12 NOTE — Telephone Encounter (Signed)
Patient is s/p L TMA on 07/16/21. French Ana from [facility] calls today to report that since yesterday - patient has soaked his bandages with malodorous drainage. Does not have chills or fever. Placed on schedule for wound check.

## 2021-08-12 NOTE — Progress Notes (Signed)
POST OPERATIVE OFFICE NOTE    CC:  F/u for surgery  HPI:  This is a 75 y.o. male who is s/p left TMA on 07/16/2021 by Dr. Randie Heinz.  He was seen on 8/12 and at that time, his sutures were removed.  He was wearing his Darco shoe for heel weight bearing and elevating his foot. He was brought back for incision check.   He has hx of RLE posterior tibial atherectomy and balloon angioplasty in March 2019 this was done for a great toe tip ulceration.  He underwent repeat angiography which demonstrated runoff via the posterior tibial on the right and anterior tibial on the left.  At that time he had no intervention undertaken.   Pt returns today for follow up.  Pt states he is not having any pain in his foot.  He states he has not had any fever or chills.  He states they are using some sort of orange strip with dressing.  Minimal drainage per pt.  He continues to wear his darco shoe.  He denies any wounds on the right foot.   Allergies  Allergen Reactions   Ace Inhibitors     Heart attack    Lisinopril     Heart attack    Current Outpatient Medications  Medication Sig Dispense Refill   acetaminophen (TYLENOL) 500 MG tablet Take 1 tablet (500 mg total) by mouth every 4 (four) hours as needed for moderate pain (pain). 30 tablet 0   aspirin EC 325 MG tablet Take 325 mg by mouth in the morning.     atorvastatin (LIPITOR) 40 MG tablet Take 40 mg by mouth in the morning.     Cholecalciferol 2000 units TABS Take 2,000 Units by mouth in the morning.     clopidogrel (PLAVIX) 75 MG tablet Take 75 mg by mouth in the morning.     Coenzyme Q10 (COQ-10) 200 MG CAPS Take 200 mg by mouth in the morning.     DULoxetine (CYMBALTA) 30 MG capsule Take 30 mg by mouth daily.     furosemide (LASIX) 40 MG tablet Take 40 mg by mouth in the morning.     Green Tea, Camellia sinensis, (GREEN TEA EXTRACT PO) Take 400 mg by mouth 2 (two) times a day.     insulin aspart protamine- aspart (NOVOLOG MIX 70/30) (70-30) 100 UNIT/ML  injection Inject 30 Units into the skin in the morning and at bedtime.     metFORMIN (GLUCOPHAGE) 1000 MG tablet Take 1,000 mg by mouth in the morning and at bedtime.     metoprolol tartrate (LOPRESSOR) 50 MG tablet Take 50 mg by mouth 2 (two) times daily.     Misc Natural Products (NEURIVA) CAPS Take 1 capsule by mouth at bedtime.     Multiple Vitamin (MULTIVITAMIN WITH MINERALS) TABS tablet Take 1 tablet by mouth daily. Men's 50+ 30 tablet 0   nitroGLYCERIN (NITROSTAT) 0.4 MG SL tablet Place 0.4 mg under the tongue every 5 (five) minutes x 3 doses as needed for chest pain.     OVER THE COUNTER MEDICATION Take 1 tablet by mouth 3 (three) times daily with meals. Sugar Blockers     Resveratrol 250 MG CAPS Take 250 mg by mouth daily with lunch.     Semaglutide, 1 MG/DOSE, (OZEMPIC, 1 MG/DOSE,) 4 MG/3ML SOPN Inject 1 mg into the skin every Monday.     traZODone (DESYREL) 50 MG tablet Take 25 mg by mouth at bedtime.  No current facility-administered medications for this visit.     ROS:  See HPI  Physical Exam:  Today's Vitals   08/12/21 1519  BP: 134/64  Pulse: 97  Temp: 98.6 F (37 C)  TempSrc: Skin  SpO2: 96%  Weight: 213 lb 9.6 oz (96.9 kg)  Height: 5\' 7"  (1.702 m)  PainSc: 0-No pain   Body mass index is 33.45 kg/m.   Incision:   Before debridement   After debridement   Extremities:  monophasic DP/PT/peroneal doppler signal    Assessment/Plan:  This is a 75 y.o. male who is s/p: left TMA on 07/16/2021 by Dr. 07/18/2021 and hx of RLE PT tibial atherectomy and balloon angioplasty in March 2019 for a great toe tip ulceration.    -pt with monophasic doppler signals.  He had a boggy area that was debridement by Dr. April 2019.  Continue wet to dry dressing changes at least daily.  -f/u on 8/31 with Dr. 9/31 for wound check.    Randie Heinz, [facility] [phone]   Clinic MD:  [phone]

## 2021-08-14 ENCOUNTER — Telehealth: Payer: Self-pay

## 2021-08-14 NOTE — Telephone Encounter (Signed)
French Ana from [facility] calls today to ask about wound care orders for patient. Per last ov note, should continue wet to dry dressing changes at least daily. He will follow up with [facility] on 8/31 for a wound check. HH aware.

## 2021-08-20 ENCOUNTER — Encounter: Payer: Self-pay | Admitting: Vascular Surgery

## 2021-08-20 ENCOUNTER — Other Ambulatory Visit: Payer: Self-pay

## 2021-08-20 ENCOUNTER — Ambulatory Visit (INDEPENDENT_AMBULATORY_CARE_PROVIDER_SITE_OTHER): Payer: No Typology Code available for payment source | Admitting: Vascular Surgery

## 2021-08-20 VITALS — BP 119/74 | HR 86 | Temp 98.2°F | Resp 20 | Ht 67.0 in | Wt 216.0 lb

## 2021-08-20 DIAGNOSIS — T8189XD Other complications of procedures, not elsewhere classified, subsequent encounter: Secondary | ICD-10-CM

## 2021-08-20 DIAGNOSIS — I739 Peripheral vascular disease, unspecified: Secondary | ICD-10-CM

## 2021-08-20 NOTE — Progress Notes (Signed)
Patient ID: [name], male   DOB: [birth year], 75 y.o.   MRN: [id number]  Reason for Consult: Wound Check   Referred by [facility]  Subjective:     HPI:  [name] is a 75 y.o. male history of left transmetatarsal amputation approximately 1 month ago.  He was just seen last week in our office for nonhealing left transmetatarsal amputation.  He has been wearing his Darco shoe.  He does walk.  He denies fevers and chills.   Past Medical History:  Diagnosis Date   CAD, AUTOLOGOUS BYPASS GRAFT    Depression    DIABETES, TYPE 2    DYSPNEA    on exertion   HYPERLIPIDEMIA    OBESITY    OBSTRUCTIVE SLEEP APNEA    cpap   Old myocardial infarction 2005   Peripheral vascular disease (HCC)    PTSD (post-traumatic stress disorder)    Stroke (HCC) 09/2007   light stroke   Family History  Problem Relation Age of Onset   Diabetes Mother    Alcoholism Father    Past Surgical History:  Procedure Laterality Date   ABDOMINAL AORTOGRAM W/LOWER EXTREMITY Bilateral 03/10/2021   Procedure: ABDOMINAL AORTOGRAM W/LOWER EXTREMITY;  Surgeon: Maeola Harman, MD;  Location: [facility] INVASIVE CV LAB;  Service: Cardiovascular;  Laterality: Bilateral;   ABDOMINAL AORTOGRAM W/LOWER EXTREMITY N/A 07/14/2021   Procedure: ABDOMINAL AORTOGRAM W/LOWER EXTREMITY;  Surgeon: Maeola Harman, MD;  Location: [facility] INVASIVE CV LAB;  Service: Cardiovascular;  Laterality: N/A;   AMPUTATION Left 03/18/2021   Procedure: LEFT FOURTH TOE AMPUTATION;  Surgeon: Maeola Harman, MD;  Location: [facility] OR;  Service: Vascular;  Laterality: Left;   APPENDECTOMY  1965   CORONARY ARTERY BYPASS GRAFT  2008   at [place]   cyst remove on lelt leg,posterior     LOWER EXTREMITY ANGIOGRAPHY N/A 03/07/2018   Procedure: LOWER EXTREMITY ANGIOGRAPHY;  Surgeon: Maeola Harman, MD;  Location: [facility] INVASIVE CV LAB;  Service: Cardiovascular;  Laterality: N/A;   LOWER EXTREMITY  ANGIOGRAPHY Bilateral 05/29/2019   Procedure: LOWER EXTREMITY ANGIOGRAPHY;  Surgeon: Maeola Harman, MD;  Location: [facility] INVASIVE CV LAB;  Service: Cardiovascular;  Laterality: Bilateral;   PERIPHERAL VASCULAR ATHERECTOMY Right 03/07/2018   Procedure: PERIPHERAL VASCULAR ATHERECTOMY;  Surgeon: Maeola Harman, MD;  Location: [facility] INVASIVE CV LAB;  Service: Cardiovascular;  Laterality: Right;  anterioer tibial   TRANSMETATARSAL AMPUTATION Left 07/16/2021   Procedure: LEFT TRANSMETATARSAL AMPUTATION;  Surgeon: Maeola Harman, MD;  Location: [facility] OR;  Service: Vascular;  Laterality: Left;   VASECTOMY     WOUND DEBRIDEMENT Left 05/02/2021   Procedure: Left 4th toe amputation site debridement including metatarsal head;  Surgeon: Cephus Shelling, MD;  Location: [facility] OR;  Service: Vascular;  Laterality: Left;    Short Social History:  Social History   Tobacco Use   Smoking status: Former    Types: Pipe   Smokeless tobacco: Never  Substance Use Topics   Alcohol use: Never    Allergies  Allergen Reactions   Ace Inhibitors     Heart attack    Lisinopril     Heart attack    Current Outpatient Medications  Medication Sig Dispense Refill   acetaminophen (TYLENOL) 500 MG tablet Take 1 tablet (500 mg total) by mouth every 4 (four) hours as needed for moderate pain (pain). 30 tablet 0   aspirin EC 325 MG tablet Take 325 mg by mouth in the morning.  atorvastatin (LIPITOR) 40 MG tablet Take 40 mg by mouth in the morning.     Cholecalciferol 2000 units TABS Take 2,000 Units by mouth in the morning.     clopidogrel (PLAVIX) 75 MG tablet Take 75 mg by mouth in the morning.     Coenzyme Q10 (COQ-10) 200 MG CAPS Take 200 mg by mouth in the morning.     DULoxetine (CYMBALTA) 30 MG capsule Take 30 mg by mouth daily.     furosemide (LASIX) 40 MG tablet Take 40 mg by mouth in the morning.     Green Tea, Camellia sinensis, (GREEN TEA EXTRACT PO) Take 400 mg by mouth 2 (two)  times a day.     insulin aspart protamine- aspart (NOVOLOG MIX 70/30) (70-30) 100 UNIT/ML injection Inject 30 Units into the skin in the morning and at bedtime.     metFORMIN (GLUCOPHAGE) 1000 MG tablet Take 1,000 mg by mouth in the morning and at bedtime.     metoprolol tartrate (LOPRESSOR) 50 MG tablet Take 50 mg by mouth 2 (two) times daily.     Misc Natural Products (NEURIVA) CAPS Take 1 capsule by mouth at bedtime.     Multiple Vitamin (MULTIVITAMIN WITH MINERALS) TABS tablet Take 1 tablet by mouth daily. Men's 50+ 30 tablet 0   nitroGLYCERIN (NITROSTAT) 0.4 MG SL tablet Place 0.4 mg under the tongue every 5 (five) minutes x 3 doses as needed for chest pain.     OVER THE COUNTER MEDICATION Take 1 tablet by mouth 3 (three) times daily with meals. Sugar Blockers     Resveratrol 250 MG CAPS Take 250 mg by mouth daily with lunch.     Semaglutide, 1 MG/DOSE, (OZEMPIC, 1 MG/DOSE,) 4 MG/3ML SOPN Inject 1 mg into the skin every Monday.     traZODone (DESYREL) 50 MG tablet Take 25 mg by mouth at bedtime.      No current facility-administered medications for this visit.    Review of Systems  Constitutional: Negative for fever.  HENT: HENT negative.  Eyes: Eyes negative.  Cardiovascular: Cardiovascular negative.  GI: Gastrointestinal negative.  Musculoskeletal: Positive for gait problem.  Skin: Positive for wound.  Hematologic: Hematologic/lymphatic negative.  Psychiatric: Psychiatric negative.       Objective:  Objective   Vitals:   08/20/21 1437  BP: 119/74  Pulse: 86  Resp: 20  Temp: 98.2 F (36.8 C)  SpO2: 98%    Physical Exam HENT:     Head: Normocephalic.     Nose:     Comments: Wearing a mask Eyes:     Pupils: Pupils are equal, round, and reactive to light.  Pulmonary:     Effort: Pulmonary effort is normal.  Abdominal:     General: Abdomen is flat.     Palpations: Abdomen is soft.  Skin:    Capillary Refill: Capillary refill takes 2 to 3 seconds.  Neurological:      Mental Status: He is alert.     Comments: Numbness left foot  Psychiatric:        Mood and Affect: Mood normal.        Behavior: Behavior normal.        Thought Content: Thought content normal.        Judgment: Judgment normal.    Data: No studies     Assessment/Plan:    75yo with nonhealing transmetatarsal amputation.  I was able to probe over the second metatarsal there is foul smell.  Does not appear  infected.  We will plan for open transmetatarsal amputation Friday, September 1 with possible wound VAC placement.  He understands that he will need admitted after this procedure.  He remains very high risk for transtibial amputation on the left.      Maeola Harman MD [facility]

## 2021-08-21 ENCOUNTER — Other Ambulatory Visit: Payer: Self-pay

## 2021-08-21 ENCOUNTER — Encounter (HOSPITAL_COMMUNITY): Payer: Self-pay | Admitting: Vascular Surgery

## 2021-08-21 NOTE — Progress Notes (Signed)
Anesthesia Chart Review: [name]   Case: [id number] Date/Time: 08/27/2021 0912   Procedure: LEFT OPEN TRANSMETATARSAL AMPUTATION (Left: Toe)   Anesthesia type: Choice   Pre-op diagnosis: PAD, NON-HEALING SURGICAL WOUND   [facility] ROOM 16 / [facility]   Surgeons: Cephus Shelling, MD       DISCUSSION: Patient is a 75 year old male scheduled for the above procedure. He is s/p left fourth toe amputation 03/18/21 with debridement including metatarsal head 05/02/21, and left transmetatarsal amputation 07/16/21.  He was seen by Dr. Lemar Livings on 08/21/1931 and TMA site was not healing. The above procedure recommended and noted patient "remains very high risk for transtibial amputation on the left."  History includes former smoker, DM2, HLD, CAD (MI, s/p CABG 2008) CVA (2005), PAD (s/p CSI atherectomy right AT/PT, balloon angioplasty right AT/TP trunk, PT 03/07/18; left 4th toe amputation 03/18/21, left TMA 07/16/21), exertional dyspnea, OSA, PTSD, obesity.   Patient with CABG in 2008. He receives care through the [facility]. Limited records can be viewed in Care Everywhere, but did not locate any cardiac studies. He has tolerated recent toe amputation and left TMA. Posted for [facility] anesthesia. Anesthesia team will have to evaluate on the day of surgery. Last Plavix 03/12/2021.  He reported last Plavix 08/20/21 and is continuing ASA.    VS: Ht 5\' 8"  (1.727 m)   Wt 92.4 kg   BMI 30.99 kg/m  BP Readings from Last 3 Encounters:  08/20/21 119/74  08/12/21 134/64  08/01/21 (!) 120/58   Pulse Readings from Last 3 Encounters:  08/20/21 86  08/12/21 97  08/01/21 82     PROVIDERS: [facility] is where he receives primary care.   LABS:For day of surgery as indicated. Last labs in [facility] include Cr 1.22, H/H 12.0/35.8 on 07/17/21. A1c 7.2% 07/11/21.   IMAGES: CXR 07/09/21: FINDINGS: The heart size and mediastinal contours are within normal limits. Both lungs are clear. The visualized  skeletal structures are unremarkable. Calcified granuloma in the right middle lobe, unchanged. Remote median sternotomy IMPRESSION: No active cardiopulmonary disease.   EKG: EKG 07/11/21: Sinus rhythm Nonspecific repol abnormality, diffuse leads Baseline wander in lead(s) V1 When compared with ECG of 03/10/2021, REPOLARIZATION ABNORMALITY is slightly more prominent Confirmed by 03/12/2021 (Dione Booze) on 07/11/2021 4:00:36 PM  EKG 03/10/21: Normal sinus rhythm Anteroseptal infarct , age undetermined Abnormal ECG No significant change since last tracing Confirmed by 03/12/21 [phone]) on 03/10/2021 8:21:32 PM     CV:  Aortogram with LLE runoff 07/14/21: Findings:  - Aorta and iliac segments are free of flow-limiting stenosis.  Bilateral renal arteries are patent with no stenosis.  Left common femoral and SFA are patent.  Popliteal artery is patent without stenosis.  Below the knee he has severe disease.  Posterior tibial is inline to the foot however is quite a diminutive vessel.  The peroneal artery occludes has multiple areas of occlusion and reconstitution but does not go past the ankle.  The anterior tibial artery has multiple greater than 50% stenosis however it also gives out at the level of the ankle and no intervention was undertaken. - Patient will need best medical therapy and left transmetatarsal amputation.  He is high risk for transtibial amputation.   Currently only stress test from 2002 and echo from 2005 available for review in [facility] and these were both prior to 2008 CABG.   Echo 10/10/04: SUMMARY   -  Overall left ventricular systolic function was at the  lower         limits of normal. Left ventricular ejection fraction was         estimated , range being 50 % to 55 %.. There was possible         hypokinesis of the periapical wall.   -  There was mild mitral valvular regurgitation.    Past Medical History:  Diagnosis Date   CAD, AUTOLOGOUS BYPASS GRAFT     Depression    DIABETES, TYPE 2    DYSPNEA    on exertion   HYPERLIPIDEMIA    OBESITY    OBSTRUCTIVE SLEEP APNEA    cpap   Old myocardial infarction 2005   Peripheral vascular disease (HCC)    PTSD (post-traumatic stress disorder)    Stroke (HCC) 09/2007   light stroke    Past Surgical History:  Procedure Laterality Date   ABDOMINAL AORTOGRAM W/LOWER EXTREMITY Bilateral 03/10/2021   Procedure: ABDOMINAL AORTOGRAM W/LOWER EXTREMITY;  Surgeon: Maeola Harman, MD;  Location: [facility] INVASIVE CV LAB;  Service: Cardiovascular;  Laterality: Bilateral;   ABDOMINAL AORTOGRAM W/LOWER EXTREMITY N/A 07/14/2021   Procedure: ABDOMINAL AORTOGRAM W/LOWER EXTREMITY;  Surgeon: Maeola Harman, MD;  Location: [facility] INVASIVE CV LAB;  Service: Cardiovascular;  Laterality: N/A;   AMPUTATION Left 03/18/2021   Procedure: LEFT FOURTH TOE AMPUTATION;  Surgeon: Maeola Harman, MD;  Location: [facility] OR;  Service: Vascular;  Laterality: Left;   APPENDECTOMY  1965   CORONARY ARTERY BYPASS GRAFT  2008   at [place]   cyst remove on lelt leg,posterior     LOWER EXTREMITY ANGIOGRAPHY N/A 03/07/2018   Procedure: LOWER EXTREMITY ANGIOGRAPHY;  Surgeon: Maeola Harman, MD;  Location: [facility] INVASIVE CV LAB;  Service: Cardiovascular;  Laterality: N/A;   LOWER EXTREMITY ANGIOGRAPHY Bilateral 05/29/2019   Procedure: LOWER EXTREMITY ANGIOGRAPHY;  Surgeon: Maeola Harman, MD;  Location: [facility] INVASIVE CV LAB;  Service: Cardiovascular;  Laterality: Bilateral;   PERIPHERAL VASCULAR ATHERECTOMY Right 03/07/2018   Procedure: PERIPHERAL VASCULAR ATHERECTOMY;  Surgeon: Maeola Harman, MD;  Location: [facility] INVASIVE CV LAB;  Service: Cardiovascular;  Laterality: Right;  anterioer tibial   TRANSMETATARSAL AMPUTATION Left 07/16/2021   Procedure: LEFT TRANSMETATARSAL AMPUTATION;  Surgeon: Maeola Harman, MD;  Location: [facility] OR;  Service: Vascular;  Laterality: Left;   VASECTOMY      WOUND DEBRIDEMENT Left 05/02/2021   Procedure: Left 4th toe amputation site debridement including metatarsal head;  Surgeon: Cephus Shelling, MD;  Location: [facility] OR;  Service: Vascular;  Laterality: Left;    MEDICATIONS: No current facility-administered medications for this encounter.    acetaminophen (TYLENOL) 500 MG tablet   aspirin EC 325 MG tablet   atorvastatin (LIPITOR) 40 MG tablet   Cholecalciferol 2000 units TABS   Coenzyme Q10 (COQ-10) 200 MG CAPS   DULoxetine (CYMBALTA) 30 MG capsule   furosemide (LASIX) 40 MG tablet   insulin aspart protamine- aspart (NOVOLOG MIX 70/30) (70-30) 100 UNIT/ML injection   metFORMIN (GLUCOPHAGE) 1000 MG tablet   metoprolol tartrate (LOPRESSOR) 50 MG tablet   Misc Natural Products (NEURIVA) CAPS   nitroGLYCERIN (NITROSTAT) 0.4 MG SL tablet   OVER THE COUNTER MEDICATION   OVER THE COUNTER MEDICATION   Semaglutide, 1 MG/DOSE, (OZEMPIC, 1 MG/DOSE,) 4 MG/3ML SOPN   traZODone (DESYREL) 50 MG tablet   APPLE CIDER VINEGAR PO   Charcoal Activated (ACTIVATED CHARCOAL PO)   clopidogrel (PLAVIX) 75 MG tablet   Green Tea, Camellia sinensis, (GREEN TEA EXTRACT PO)  Multiple Vitamin (MULTIVITAMIN WITH MINERALS) TABS tablet   RESVERATROL PO    Shonna Chock, PA-C Surgical Short Stay/Anesthesiology [facility] Phone [phone] [facility] Phone [phone] 08/21/2021 1:25 PM

## 2021-08-21 NOTE — Anesthesia Preprocedure Evaluation (Addendum)
Anesthesia Evaluation  Patient identified by MRN, date of birth, ID band Patient awake    Reviewed: Allergy & Precautions, NPO status , Patient's Chart, lab work & pertinent test results, reviewed documented beta blocker date and time   History of Anesthesia Complications Negative for: history of anesthetic complications  Airway Mallampati: II  TM Distance: >3 FB Neck ROM: Full    Dental  (+) Poor Dentition, Missing, Chipped, Dental Advisory Given   Pulmonary sleep apnea and Continuous Positive Airway Pressure Ventilation , former smoker,    breath sounds clear to auscultation       Cardiovascular hypertension, Pt. on medications and Pt. on home beta blockers (-) angina+ CAD, + Past MI, + CABG (2008) and + Peripheral Vascular Disease   Rhythm:Regular Rate:Normal  '05 ECHO: EF 50- 55 %, possible hypokinesis of the periapical wall, mild MR.     Neuro/Psych PSYCHIATRIC DISORDERS (PTSD) Anxiety Depression CVA, No Residual Symptoms    GI/Hepatic negative GI ROS, Neg liver ROS,   Endo/Other  diabetes (glu 111), Oral Hypoglycemic Agents, Insulin Dependentobese  Renal/GU negative Renal ROS     Musculoskeletal   Abdominal (+) + obese,   Peds  Hematology negative hematology ROS (+)   Anesthesia Other Findings   Reproductive/Obstetrics                           Anesthesia Physical Anesthesia Plan  ASA: 3  Anesthesia Plan: Regional   Post-op Pain Management:    Induction:   PONV Risk Score and Plan: 1 and Ondansetron and Treatment may vary due to age or medical condition  Airway Management Planned: Simple Face Mask  Additional Equipment: None  Intra-op Plan:   Post-operative Plan:   Informed Consent:     Dental advisory given  Plan Discussed with: CRNA, Surgeon and Anesthesiologist  Anesthesia Plan Comments: (PAT note written 08/21/2021 by Shonna Chock, PA-C. Plan routine  monitors, ankle block with sedation )     Anesthesia Quick Evaluation

## 2021-08-21 NOTE — Progress Notes (Signed)
PCP - Pt goes to [place] - per pt does not see anyone specifically Cardiologist - pt does not remember cardiologist - per chart looks like Dr. Daleen Squibb was cardiologist  EKG - 07/11/21 Chest x-ray -  ECHO - denies Cardiac Cath - denies CPAP - yes  Fasting Blood Sugar:  74-90s Checks Blood Sugar:  4x/day  Blood Thinner Instructions: per pt last dose Plavix 08/20/21 Aspirin Instructions: continue DOS  COVID TEST- DOS  Anesthesia review: yes  -------------  SDW INSTRUCTIONS:  Your procedure is scheduled on Friday 9/2. Please report to [facility] Main Entrance "A" at 0630 A.M., and check in at the Admitting office. Call this number if you have problems the morning of surgery: [phone]   Remember: Do not eat or drink after midnight the night before your surgery   Medications to take morning of surgery with a sip of water include: acetaminophen (TYLENOL) - if needed aspirin EC  atorvastatin (LIPITOR) DULoxetine (CYMBALTA) metoprolol tartrate (LOPRESSOR) nitroGLYCERIN (NITROSTAT) - if needed    ** PLEASE check your blood sugar the morning of your surgery when you wake up and every 2 hours until you get to the Short Stay unit.  If your blood sugar is less than 70 mg/dL, you will need to treat for low blood sugar: Do not take insulin. Treat a low blood sugar (less than 70 mg/dL) with  cup of clear juice (cranberry or apple), 4 glucose tablets, OR glucose gel. Recheck blood sugar in 15 minutes after treatment (to make sure it is greater than 70 mg/dL). If your blood sugar is not greater than 70 mg/dL on recheck, call [phone] for further instructions.  (NOVOLOG MIX 70/30) 9/1: AM usual dose, PM 21 units 9/2: none  metFORMIN (GLUCOPHAGE) 9/1: usual 9/2: none  As of today, STOP taking any Aleve, Naproxen, Ibuprofen, Motrin, Advil, Goody's, BC's, all herbal medications, fish oil, and all vitamins.    The Morning of Surgery Do not wear jewelry Do not wear lotions, powders,  colognes, or deodorant Men may shave face and neck. Do not bring valuables to the hospital. [facility] is not responsible for any belongings or valuables.  If you are a smoker, DO NOT Smoke 24 hours prior to surgery  If you wear a CPAP at night please bring your mask the morning of surgery   Remember that you must have someone to transport you home after your surgery, and remain with you for 24 hours if you are discharged the same day.  Please bring cases for contacts, glasses, hearing aids, dentures or bridgework because it cannot be worn into surgery.   Patients discharged the day of surgery will not be allowed to drive home.   Please shower the NIGHT BEFORE/MORNING OF SURGERY (use antibacterial soap like DIAL soap if possible). Wear comfortable clothes the morning of surgery. Oral Hygiene is also important to reduce your risk of infection.  Remember - BRUSH YOUR TEETH THE MORNING OF SURGERY WITH YOUR REGULAR TOOTHPASTE  Patient denies shortness of breath, fever, cough and chest pain.

## 2021-08-22 ENCOUNTER — Other Ambulatory Visit: Payer: Self-pay

## 2021-08-22 ENCOUNTER — Encounter (HOSPITAL_COMMUNITY): Admission: RE | Disposition: E | Payer: Self-pay | Source: Home / Self Care | Attending: Vascular Surgery

## 2021-08-22 ENCOUNTER — Ambulatory Visit: Payer: No Typology Code available for payment source

## 2021-08-22 ENCOUNTER — Encounter (HOSPITAL_COMMUNITY): Payer: Self-pay | Admitting: Vascular Surgery

## 2021-08-22 ENCOUNTER — Inpatient Hospital Stay (HOSPITAL_COMMUNITY): Payer: No Typology Code available for payment source | Admitting: Vascular Surgery

## 2021-08-22 ENCOUNTER — Inpatient Hospital Stay (HOSPITAL_COMMUNITY)
Admission: RE | Admit: 2021-08-22 | Discharge: 2021-09-20 | DRG: 500 | Disposition: E | Payer: No Typology Code available for payment source | Attending: Vascular Surgery | Admitting: Vascular Surgery

## 2021-08-22 DIAGNOSIS — T80818A Extravasation of other vesicant agent, initial encounter: Secondary | ICD-10-CM | POA: Diagnosis not present

## 2021-08-22 DIAGNOSIS — G9340 Encephalopathy, unspecified: Secondary | ICD-10-CM | POA: Diagnosis present

## 2021-08-22 DIAGNOSIS — I429 Cardiomyopathy, unspecified: Secondary | ICD-10-CM | POA: Diagnosis present

## 2021-08-22 DIAGNOSIS — Z4659 Encounter for fitting and adjustment of other gastrointestinal appliance and device: Secondary | ICD-10-CM

## 2021-08-22 DIAGNOSIS — F4310 Post-traumatic stress disorder, unspecified: Secondary | ICD-10-CM | POA: Diagnosis present

## 2021-08-22 DIAGNOSIS — Z951 Presence of aortocoronary bypass graft: Secondary | ICD-10-CM

## 2021-08-22 DIAGNOSIS — I469 Cardiac arrest, cause unspecified: Secondary | ICD-10-CM | POA: Diagnosis not present

## 2021-08-22 DIAGNOSIS — J690 Pneumonitis due to inhalation of food and vomit: Secondary | ICD-10-CM | POA: Diagnosis not present

## 2021-08-22 DIAGNOSIS — Z7902 Long term (current) use of antithrombotics/antiplatelets: Secondary | ICD-10-CM

## 2021-08-22 DIAGNOSIS — E1152 Type 2 diabetes mellitus with diabetic peripheral angiopathy with gangrene: Secondary | ICD-10-CM | POA: Diagnosis present

## 2021-08-22 DIAGNOSIS — Z515 Encounter for palliative care: Secondary | ICD-10-CM

## 2021-08-22 DIAGNOSIS — Z87891 Personal history of nicotine dependence: Secondary | ICD-10-CM

## 2021-08-22 DIAGNOSIS — R570 Cardiogenic shock: Secondary | ICD-10-CM | POA: Diagnosis present

## 2021-08-22 DIAGNOSIS — T8789 Other complications of amputation stump: Principal | ICD-10-CM | POA: Diagnosis present

## 2021-08-22 DIAGNOSIS — I4891 Unspecified atrial fibrillation: Secondary | ICD-10-CM | POA: Diagnosis present

## 2021-08-22 DIAGNOSIS — R579 Shock, unspecified: Secondary | ICD-10-CM | POA: Diagnosis present

## 2021-08-22 DIAGNOSIS — Y835 Amputation of limb(s) as the cause of abnormal reaction of the patient, or of later complication, without mention of misadventure at the time of the procedure: Secondary | ICD-10-CM | POA: Diagnosis present

## 2021-08-22 DIAGNOSIS — Z66 Do not resuscitate: Secondary | ICD-10-CM | POA: Diagnosis not present

## 2021-08-22 DIAGNOSIS — Z8673 Personal history of transient ischemic attack (TIA), and cerebral infarction without residual deficits: Secondary | ICD-10-CM

## 2021-08-22 DIAGNOSIS — Z79899 Other long term (current) drug therapy: Secondary | ICD-10-CM

## 2021-08-22 DIAGNOSIS — E875 Hyperkalemia: Secondary | ICD-10-CM | POA: Diagnosis not present

## 2021-08-22 DIAGNOSIS — I249 Acute ischemic heart disease, unspecified: Secondary | ICD-10-CM | POA: Diagnosis not present

## 2021-08-22 DIAGNOSIS — I471 Supraventricular tachycardia: Secondary | ICD-10-CM | POA: Diagnosis present

## 2021-08-22 DIAGNOSIS — Z7982 Long term (current) use of aspirin: Secondary | ICD-10-CM

## 2021-08-22 DIAGNOSIS — I739 Peripheral vascular disease, unspecified: Secondary | ICD-10-CM | POA: Diagnosis present

## 2021-08-22 DIAGNOSIS — D6489 Other specified anemias: Secondary | ICD-10-CM | POA: Diagnosis present

## 2021-08-22 DIAGNOSIS — G931 Anoxic brain damage, not elsewhere classified: Secondary | ICD-10-CM | POA: Diagnosis present

## 2021-08-22 DIAGNOSIS — F32A Depression, unspecified: Secondary | ICD-10-CM | POA: Diagnosis present

## 2021-08-22 DIAGNOSIS — J9601 Acute respiratory failure with hypoxia: Secondary | ICD-10-CM | POA: Diagnosis not present

## 2021-08-22 DIAGNOSIS — Z811 Family history of alcohol abuse and dependence: Secondary | ICD-10-CM

## 2021-08-22 DIAGNOSIS — I340 Nonrheumatic mitral (valve) insufficiency: Secondary | ICD-10-CM | POA: Diagnosis present

## 2021-08-22 DIAGNOSIS — E1165 Type 2 diabetes mellitus with hyperglycemia: Secondary | ICD-10-CM | POA: Diagnosis present

## 2021-08-22 DIAGNOSIS — E669 Obesity, unspecified: Secondary | ICD-10-CM | POA: Diagnosis present

## 2021-08-22 DIAGNOSIS — Z6372 Alcoholism and drug addiction in family: Secondary | ICD-10-CM

## 2021-08-22 DIAGNOSIS — R4020 Unspecified coma: Secondary | ICD-10-CM | POA: Diagnosis not present

## 2021-08-22 DIAGNOSIS — I10 Essential (primary) hypertension: Secondary | ICD-10-CM | POA: Diagnosis present

## 2021-08-22 DIAGNOSIS — Z888 Allergy status to other drugs, medicaments and biological substances status: Secondary | ICD-10-CM

## 2021-08-22 DIAGNOSIS — Z7984 Long term (current) use of oral hypoglycemic drugs: Secondary | ICD-10-CM

## 2021-08-22 DIAGNOSIS — I2510 Atherosclerotic heart disease of native coronary artery without angina pectoris: Secondary | ICD-10-CM | POA: Diagnosis present

## 2021-08-22 DIAGNOSIS — G4733 Obstructive sleep apnea (adult) (pediatric): Secondary | ICD-10-CM | POA: Diagnosis present

## 2021-08-22 DIAGNOSIS — N179 Acute kidney failure, unspecified: Secondary | ICD-10-CM | POA: Diagnosis not present

## 2021-08-22 DIAGNOSIS — Z794 Long term (current) use of insulin: Secondary | ICD-10-CM

## 2021-08-22 DIAGNOSIS — Z9911 Dependence on respirator [ventilator] status: Secondary | ICD-10-CM | POA: Diagnosis not present

## 2021-08-22 DIAGNOSIS — Z833 Family history of diabetes mellitus: Secondary | ICD-10-CM

## 2021-08-22 DIAGNOSIS — I252 Old myocardial infarction: Secondary | ICD-10-CM

## 2021-08-22 DIAGNOSIS — E785 Hyperlipidemia, unspecified: Secondary | ICD-10-CM | POA: Diagnosis present

## 2021-08-22 DIAGNOSIS — I493 Ventricular premature depolarization: Secondary | ICD-10-CM | POA: Diagnosis present

## 2021-08-22 HISTORY — PX: APPLICATION OF WOUND VAC: SHX5189

## 2021-08-22 HISTORY — PX: TRANSMETATARSAL AMPUTATION: SHX6197

## 2021-08-22 LAB — CBC
HCT: 38.5 % — ABNORMAL LOW (ref 39.0–52.0)
Hemoglobin: 12.6 g/dL — ABNORMAL LOW (ref 13.0–17.0)
MCH: 31.6 pg (ref 26.0–34.0)
MCHC: 32.7 g/dL (ref 30.0–36.0)
MCV: 96.5 fL (ref 80.0–100.0)
Platelets: 194 10*3/uL (ref 150–400)
RBC: 3.99 MIL/uL — ABNORMAL LOW (ref 4.22–5.81)
RDW: 14.2 % (ref 11.5–15.5)
WBC: 6.7 10*3/uL (ref 4.0–10.5)
nRBC: 0 % (ref 0.0–0.2)

## 2021-08-22 LAB — POCT I-STAT, CHEM 8
BUN: 14 mg/dL (ref 8–23)
Calcium, Ion: 1.27 mmol/L (ref 1.15–1.40)
Chloride: 101 mmol/L (ref 98–111)
Creatinine, Ser: 1 mg/dL (ref 0.61–1.24)
Glucose, Bld: 100 mg/dL — ABNORMAL HIGH (ref 70–99)
HCT: 48 % (ref 39.0–52.0)
Hemoglobin: 16.3 g/dL (ref 13.0–17.0)
Potassium: 4.7 mmol/L (ref 3.5–5.1)
Sodium: 140 mmol/L (ref 135–145)
TCO2: 25 mmol/L (ref 22–32)

## 2021-08-22 LAB — CREATININE, SERUM
Creatinine, Ser: 1.1 mg/dL (ref 0.61–1.24)
GFR, Estimated: >60 mL/min (ref >60)

## 2021-08-22 LAB — GLUCOSE, CAPILLARY
Glucose-Capillary: 111 mg/dL — ABNORMAL HIGH (ref 70–99)
Glucose-Capillary: 113 mg/dL — ABNORMAL HIGH (ref 70–99)
Glucose-Capillary: 166 mg/dL — ABNORMAL HIGH (ref 70–99)
Glucose-Capillary: 255 mg/dL — ABNORMAL HIGH (ref 70–99)

## 2021-08-22 SURGERY — AMPUTATION, FOOT, TRANSMETATARSAL
Anesthesia: Regional | Site: Foot | Laterality: Left

## 2021-08-22 MED ORDER — HYDROCODONE-ACETAMINOPHEN 5-325 MG PO TABS: 1.0000 | ORAL_TABLET | ORAL | Status: DC | PRN

## 2021-08-22 MED ORDER — SENNOSIDES-DOCUSATE SODIUM 8.6-50 MG PO TABS: 1.0000 | ORAL_TABLET | Freq: Every evening | ORAL | Status: DC | PRN

## 2021-08-22 MED ORDER — NEURIVA PO CAPS: 1.0000 | ORAL_CAPSULE | Freq: Every day | ORAL | Status: DC

## 2021-08-22 MED ORDER — METOPROLOL TARTRATE 50 MG PO TABS
50.0000 mg | ORAL_TABLET | Freq: Two times a day (BID) | ORAL | Status: DC
  Administered 2021-08-23 – 2021-08-25 (×5): 50 mg via ORAL
  Filled 2021-08-22 (×6): qty 1

## 2021-08-22 MED ORDER — SODIUM CHLORIDE 0.9% FLUSH: 3.0000 mL | INTRAVENOUS | Status: DC | PRN

## 2021-08-22 MED ORDER — PROPOFOL 500 MG/50ML IV EMUL
INTRAVENOUS | Status: DC | PRN
  Administered 2021-08-22: 75 ug/kg/min via INTRAVENOUS

## 2021-08-22 MED ORDER — MAGNESIUM SULFATE 2 GM/50ML IV SOLN: 2.0000 g | Freq: Every day | INTRAVENOUS | Status: DC | PRN

## 2021-08-22 MED ORDER — SODIUM CHLORIDE 0.9% FLUSH
3.0000 mL | Freq: Two times a day (BID) | INTRAVENOUS | Status: DC
  Administered 2021-08-22 – 2021-08-25 (×7): 3 mL via INTRAVENOUS

## 2021-08-22 MED ORDER — MORPHINE SULFATE (PF) 2 MG/ML IV SOLN
0.5000 mg | INTRAVENOUS | Status: DC | PRN
  Administered 2021-08-22 – 2021-08-25 (×4): 1 mg via INTRAVENOUS
  Filled 2021-08-22 (×4): qty 1

## 2021-08-22 MED ORDER — HEPARIN SODIUM (PORCINE) 5000 UNIT/ML IJ SOLN
5000.0000 [IU] | Freq: Three times a day (TID) | INTRAMUSCULAR | Status: DC
  Administered 2021-08-23 – 2021-08-29 (×20): 5000 [IU] via SUBCUTANEOUS
  Filled 2021-08-22 (×20): qty 1

## 2021-08-22 MED ORDER — PANTOPRAZOLE SODIUM 40 MG PO TBEC
40.0000 mg | DELAYED_RELEASE_TABLET | Freq: Every day | ORAL | Status: DC
  Administered 2021-08-22 – 2021-08-25 (×4): 40 mg via ORAL
  Filled 2021-08-22 (×4): qty 1

## 2021-08-22 MED ORDER — FENTANYL CITRATE (PF) 250 MCG/5ML IJ SOLN
INTRAMUSCULAR | Status: DC | PRN
  Administered 2021-08-22: 50 ug via INTRAVENOUS

## 2021-08-22 MED ORDER — MIDAZOLAM HCL 2 MG/2ML IJ SOLN
INTRAMUSCULAR | Status: AC
  Administered 2021-08-22: 1 mg via INTRAVENOUS
  Filled 2021-08-22: qty 2

## 2021-08-22 MED ORDER — CHLORHEXIDINE GLUCONATE 0.12 % MT SOLN
15.0000 mL | Freq: Once | OROMUCOSAL | Status: AC
  Administered 2021-08-22: 15 mL via OROMUCOSAL
  Filled 2021-08-22: qty 15

## 2021-08-22 MED ORDER — LIDOCAINE 2% (20 MG/ML) 5 ML SYRINGE
INTRAMUSCULAR | Status: AC
  Filled 2021-08-22: qty 10

## 2021-08-22 MED ORDER — POTASSIUM CHLORIDE CRYS ER 20 MEQ PO TBCR: 20.0000 meq | EXTENDED_RELEASE_TABLET | Freq: Every day | ORAL | Status: DC | PRN

## 2021-08-22 MED ORDER — INSULIN ASPART PROT & ASPART (70-30 MIX) 100 UNIT/ML ~~LOC~~ SUSP
30.0000 [IU] | Freq: Two times a day (BID) | SUBCUTANEOUS | Status: DC
  Administered 2021-08-22 – 2021-08-25 (×3): 30 [IU] via SUBCUTANEOUS
  Filled 2021-08-22: qty 10

## 2021-08-22 MED ORDER — CHLORHEXIDINE GLUCONATE 4 % EX LIQD: 60.0000 mL | Freq: Once | CUTANEOUS | Status: DC

## 2021-08-22 MED ORDER — DULOXETINE HCL 30 MG PO CPEP
30.0000 mg | ORAL_CAPSULE | Freq: Every day | ORAL | Status: DC
  Administered 2021-08-23 – 2021-08-25 (×3): 30 mg via ORAL
  Filled 2021-08-22 (×4): qty 1

## 2021-08-22 MED ORDER — BUPIVACAINE HCL (PF) 0.5 % IJ SOLN
INTRAMUSCULAR | Status: DC | PRN
  Administered 2021-08-22: 40 mL via PERINEURAL

## 2021-08-22 MED ORDER — ONDANSETRON HCL 4 MG/2ML IJ SOLN
INTRAMUSCULAR | Status: DC | PRN
  Administered 2021-08-22: 4 mg via INTRAVENOUS

## 2021-08-22 MED ORDER — BISACODYL 5 MG PO TBEC: 5.0000 mg | DELAYED_RELEASE_TABLET | Freq: Every day | ORAL | Status: DC | PRN

## 2021-08-22 MED ORDER — PROPOFOL 10 MG/ML IV BOLUS
INTRAVENOUS | Status: DC | PRN
  Administered 2021-08-22: 30 mg via INTRAVENOUS
  Administered 2021-08-22: 70 mg via INTRAVENOUS
  Administered 2021-08-22: 60 mg via INTRAVENOUS

## 2021-08-22 MED ORDER — OXYCODONE HCL 5 MG/5ML PO SOLN
5.0000 mg | Freq: Once | ORAL | Status: AC | PRN
Start: 2021-08-22 — End: 2021-08-22

## 2021-08-22 MED ORDER — FENTANYL CITRATE (PF) 100 MCG/2ML IJ SOLN
50.0000 ug | Freq: Once | INTRAMUSCULAR | Status: AC
Start: 2021-08-22 — End: 2021-08-22

## 2021-08-22 MED ORDER — HYDRALAZINE HCL 20 MG/ML IJ SOLN: 5.0000 mg | INTRAMUSCULAR | Status: DC | PRN

## 2021-08-22 MED ORDER — VANCOMYCIN HCL 1250 MG/250ML IV SOLN
1250.0000 mg | INTRAVENOUS | Status: DC
  Administered 2021-08-22 – 2021-08-24 (×3): 1250 mg via INTRAVENOUS
  Filled 2021-08-22 (×3): qty 250

## 2021-08-22 MED ORDER — CEFAZOLIN SODIUM-DEXTROSE 2-4 GM/100ML-% IV SOLN
2.0000 g | INTRAVENOUS | Status: AC
  Administered 2021-08-22: 2 g via INTRAVENOUS
  Filled 2021-08-22: qty 100

## 2021-08-22 MED ORDER — CLOPIDOGREL BISULFATE 75 MG PO TABS
75.0000 mg | ORAL_TABLET | Freq: Every morning | ORAL | Status: DC
  Administered 2021-08-23 – 2021-08-26 (×4): 75 mg via ORAL
  Filled 2021-08-22 (×4): qty 1

## 2021-08-22 MED ORDER — ACETAMINOPHEN 325 MG PO TABS: 325.0000 mg | ORAL_TABLET | Freq: Four times a day (QID) | ORAL | Status: DC | PRN

## 2021-08-22 MED ORDER — MEPERIDINE HCL 25 MG/ML IJ SOLN: 6.2500 mg | INTRAMUSCULAR | Status: DC | PRN

## 2021-08-22 MED ORDER — ONDANSETRON HCL 4 MG/2ML IJ SOLN: 4.0000 mg | Freq: Four times a day (QID) | INTRAMUSCULAR | Status: DC | PRN

## 2021-08-22 MED ORDER — FUROSEMIDE 40 MG PO TABS
40.0000 mg | ORAL_TABLET | Freq: Every morning | ORAL | Status: DC
  Administered 2021-08-23 – 2021-08-26 (×4): 40 mg via ORAL
  Filled 2021-08-22 (×5): qty 1

## 2021-08-22 MED ORDER — INSULIN ASPART 100 UNIT/ML IJ SOLN
0.0000 [IU] | Freq: Three times a day (TID) | INTRAMUSCULAR | Status: DC
Start: 2021-08-23 — End: 2021-08-26
  Administered 2021-08-23: 2 [IU] via SUBCUTANEOUS
  Administered 2021-08-24 – 2021-08-26 (×3): 3 [IU] via SUBCUTANEOUS

## 2021-08-22 MED ORDER — PHENOL 1.4 % MT LIQD: 1.0000 | OROMUCOSAL | Status: DC | PRN

## 2021-08-22 MED ORDER — TRAZODONE HCL 50 MG PO TABS
25.0000 mg | ORAL_TABLET | Freq: Every day | ORAL | Status: DC
  Administered 2021-08-22 – 2021-08-25 (×4): 25 mg via ORAL
  Filled 2021-08-22 (×4): qty 1

## 2021-08-22 MED ORDER — OXYCODONE HCL 5 MG PO TABS
ORAL_TABLET | ORAL | Status: AC
  Filled 2021-08-22: qty 1

## 2021-08-22 MED ORDER — MIDAZOLAM HCL 2 MG/2ML IJ SOLN: 1.0000 mg | Freq: Once | INTRAMUSCULAR | Status: AC

## 2021-08-22 MED ORDER — FENTANYL CITRATE (PF) 250 MCG/5ML IJ SOLN
INTRAMUSCULAR | Status: AC
  Filled 2021-08-22: qty 5

## 2021-08-22 MED ORDER — SEMAGLUTIDE (1 MG/DOSE) 4 MG/3ML ~~LOC~~ SOPN: 1.0000 mg | PEN_INJECTOR | SUBCUTANEOUS | Status: DC

## 2021-08-22 MED ORDER — PIPERACILLIN-TAZOBACTAM 3.375 G IVPB
3.3750 g | Freq: Four times a day (QID) | INTRAVENOUS | Status: AC
  Administered 2021-08-22 – 2021-08-23 (×3): 3.375 g via INTRAVENOUS
  Filled 2021-08-22 (×3): qty 50

## 2021-08-22 MED ORDER — MIDAZOLAM HCL 2 MG/2ML IJ SOLN: 0.5000 mg | Freq: Once | INTRAMUSCULAR | Status: DC | PRN

## 2021-08-22 MED ORDER — 0.9 % SODIUM CHLORIDE (POUR BTL) OPTIME
TOPICAL | Status: DC | PRN
  Administered 2021-08-22: 1000 mL

## 2021-08-22 MED ORDER — FENTANYL CITRATE (PF) 100 MCG/2ML IJ SOLN
INTRAMUSCULAR | Status: AC
  Administered 2021-08-22: 50 ug via INTRAVENOUS
  Filled 2021-08-22: qty 2

## 2021-08-22 MED ORDER — OXYCODONE HCL 5 MG PO TABS
5.0000 mg | ORAL_TABLET | Freq: Once | ORAL | Status: AC | PRN
  Administered 2021-08-22: 5 mg via ORAL

## 2021-08-22 MED ORDER — ORAL CARE MOUTH RINSE: 15.0000 mL | Freq: Once | OROMUCOSAL | Status: AC

## 2021-08-22 MED ORDER — PHENYLEPHRINE 40 MCG/ML (10ML) SYRINGE FOR IV PUSH (FOR BLOOD PRESSURE SUPPORT)
PREFILLED_SYRINGE | INTRAVENOUS | Status: DC | PRN
  Administered 2021-08-22 (×2): 120 ug via INTRAVENOUS
  Administered 2021-08-22: 160 ug via INTRAVENOUS

## 2021-08-22 MED ORDER — ACETAMINOPHEN 500 MG PO TABS
1000.0000 mg | ORAL_TABLET | Freq: Once | ORAL | Status: AC
  Administered 2021-08-22: 1000 mg via ORAL
  Filled 2021-08-22: qty 2

## 2021-08-22 MED ORDER — HYDROMORPHONE HCL 1 MG/ML IJ SOLN: 0.2500 mg | INTRAMUSCULAR | Status: DC | PRN

## 2021-08-22 MED ORDER — NITROGLYCERIN 0.4 MG SL SUBL: 0.4000 mg | SUBLINGUAL_TABLET | SUBLINGUAL | Status: DC | PRN

## 2021-08-22 MED ORDER — ATORVASTATIN CALCIUM 40 MG PO TABS
40.0000 mg | ORAL_TABLET | Freq: Every morning | ORAL | Status: DC
  Administered 2021-08-23 – 2021-08-25 (×3): 40 mg via ORAL
  Filled 2021-08-22 (×3): qty 1

## 2021-08-22 MED ORDER — METOPROLOL TARTRATE 5 MG/5ML IV SOLN: 2.0000 mg | INTRAVENOUS | Status: DC | PRN

## 2021-08-22 MED ORDER — ALUM & MAG HYDROXIDE-SIMETH 200-200-20 MG/5ML PO SUSP: 15.0000 mL | ORAL | Status: DC | PRN

## 2021-08-22 MED ORDER — HYDROCODONE-ACETAMINOPHEN 7.5-325 MG PO TABS
1.0000 | ORAL_TABLET | ORAL | Status: DC | PRN
  Administered 2021-08-22 – 2021-08-23 (×2): 2 via ORAL
  Filled 2021-08-22 (×2): qty 2

## 2021-08-22 MED ORDER — LIDOCAINE 2% (20 MG/ML) 5 ML SYRINGE
INTRAMUSCULAR | Status: DC | PRN
  Administered 2021-08-22: 40 mg via INTRAVENOUS

## 2021-08-22 MED ORDER — LACTATED RINGERS IV SOLN: INTRAVENOUS | Status: DC

## 2021-08-22 MED ORDER — METFORMIN HCL 500 MG PO TABS
1000.0000 mg | ORAL_TABLET | Freq: Three times a day (TID) | ORAL | Status: DC
  Administered 2021-08-22 – 2021-08-25 (×10): 1000 mg via ORAL
  Filled 2021-08-22 (×10): qty 2

## 2021-08-22 MED ORDER — SODIUM CHLORIDE 0.9 % IV SOLN: INTRAVENOUS | Status: DC

## 2021-08-22 MED ORDER — DOCUSATE SODIUM 100 MG PO CAPS
100.0000 mg | ORAL_CAPSULE | Freq: Every day | ORAL | Status: DC
  Administered 2021-08-23 – 2021-08-25 (×3): 100 mg via ORAL
  Filled 2021-08-22 (×3): qty 1

## 2021-08-22 MED ORDER — HEPARIN SODIUM (PORCINE) 1000 UNIT/ML IJ SOLN
INTRAMUSCULAR | Status: AC
  Filled 2021-08-22: qty 1

## 2021-08-22 MED ORDER — GUAIFENESIN-DM 100-10 MG/5ML PO SYRP: 15.0000 mL | ORAL_SOLUTION | ORAL | Status: DC | PRN

## 2021-08-22 MED ORDER — ONDANSETRON HCL 4 MG/2ML IJ SOLN
INTRAMUSCULAR | Status: AC
  Filled 2021-08-22: qty 4

## 2021-08-22 MED ORDER — LABETALOL HCL 5 MG/ML IV SOLN: 10.0000 mg | INTRAVENOUS | Status: DC | PRN

## 2021-08-22 MED ORDER — PROMETHAZINE HCL 25 MG/ML IJ SOLN: 6.2500 mg | INTRAMUSCULAR | Status: DC | PRN

## 2021-08-22 MED ORDER — SODIUM CHLORIDE 0.9 % IV SOLN: 250.0000 mL | INTRAVENOUS | Status: DC | PRN

## 2021-08-22 MED ORDER — EPHEDRINE 5 MG/ML INJ
INTRAVENOUS | Status: AC
  Filled 2021-08-22: qty 5

## 2021-08-22 MED ORDER — ASPIRIN EC 325 MG PO TBEC
325.0000 mg | DELAYED_RELEASE_TABLET | Freq: Every morning | ORAL | Status: DC
  Administered 2021-08-23 – 2021-08-25 (×3): 325 mg via ORAL
  Filled 2021-08-22 (×3): qty 1

## 2021-08-22 SURGICAL SUPPLY — 36 items
BAG COUNTER SPONGE SURGICOUNT (BAG) ×2 IMPLANT
BLADE CORE FAN STRYKER (BLADE) IMPLANT
BLADE SAW SAG 73X25 THK (BLADE) ×1
BLADE SAW SGTL 73X25 THK (BLADE) ×1 IMPLANT
BNDG ELASTIC 4X5.8 VLCR STR LF (GAUZE/BANDAGES/DRESSINGS) ×2 IMPLANT
BNDG GAUZE ELAST 4 BULKY (GAUZE/BANDAGES/DRESSINGS) IMPLANT
CANISTER SUCT 3000ML PPV (MISCELLANEOUS) ×2 IMPLANT
CANISTER WOUNDNEG PRESSURE 500 (CANNISTER) ×2 IMPLANT
COVER SURGICAL LIGHT HANDLE (MISCELLANEOUS) ×2 IMPLANT
DRAPE EXTREMITY T 121X128X90 (DISPOSABLE) ×2 IMPLANT
DRAPE HALF SHEET 40X57 (DRAPES) ×2 IMPLANT
DRAPE INCISE IOBAN 66X45 STRL (DRAPES) ×2 IMPLANT
DRSG ADAPTIC 3X8 NADH LF (GAUZE/BANDAGES/DRESSINGS) IMPLANT
DRSG VAC ATS SM SENSATRAC (GAUZE/BANDAGES/DRESSINGS) ×2 IMPLANT
ELECT REM PT RETURN 9FT ADLT (ELECTROSURGICAL) ×2
ELECTRODE REM PT RTRN 9FT ADLT (ELECTROSURGICAL) ×1 IMPLANT
GAUZE SPONGE 4X4 12PLY STRL (GAUZE/BANDAGES/DRESSINGS) ×2 IMPLANT
GLOVE SRG 8 PF TXTR STRL LF DI (GLOVE) ×1 IMPLANT
GLOVE SURG ENC MOIS LTX SZ7.5 (GLOVE) ×2 IMPLANT
GLOVE SURG UNDER POLY LF SZ8 (GLOVE) ×2
GOWN STRL REUS W/ TWL LRG LVL3 (GOWN DISPOSABLE) ×2 IMPLANT
GOWN STRL REUS W/ TWL XL LVL3 (GOWN DISPOSABLE) ×1 IMPLANT
GOWN STRL REUS W/TWL LRG LVL3 (GOWN DISPOSABLE) ×4
GOWN STRL REUS W/TWL XL LVL3 (GOWN DISPOSABLE) ×2
KIT BASIN OR (CUSTOM PROCEDURE TRAY) ×2 IMPLANT
KIT TURNOVER KIT B (KITS) ×2 IMPLANT
NS IRRIG 1000ML POUR BTL (IV SOLUTION) ×2 IMPLANT
PACK GENERAL/GYN (CUSTOM PROCEDURE TRAY) ×2 IMPLANT
PAD ARMBOARD 7.5X6 YLW CONV (MISCELLANEOUS) ×4 IMPLANT
SUT ETHILON 2 0 FS 18 (SUTURE) ×4 IMPLANT
SUT ETHILON 2 0 PSLX (SUTURE) IMPLANT
SUT SILK 3 0 SH CR/8 (SUTURE) ×2 IMPLANT
TOWEL GREEN STERILE (TOWEL DISPOSABLE) ×4 IMPLANT
TOWEL GREEN STERILE FF (TOWEL DISPOSABLE) ×2 IMPLANT
UNDERPAD 30X36 HEAVY ABSORB (UNDERPADS AND DIAPERS) ×2 IMPLANT
WATER STERILE IRR 1000ML POUR (IV SOLUTION) IMPLANT

## 2021-08-22 NOTE — Progress Notes (Signed)
PHARMACIST - PHYSICIAN ORDER COMMUNICATION  CONCERNING: P&T Medication Policy on Herbal Medications  DESCRIPTION:  This patient's order for:  [name]  has been noted.  This product(s) is classified as an "herbal" or natural product. Due to a lack of definitive safety studies or FDA approval, nonstandard manufacturing practices, plus the potential risk of unknown drug-drug interactions while on inpatient medications, the Pharmacy and Therapeutics Committee does not permit the use of "herbal" or natural products of this type within [facility].   ACTION TAKEN: The pharmacy department is unable to verify this order at this time and your patient has been informed of this safety policy. Please reevaluate patient's clinical condition at discharge and address if the herbal or natural product(s) should be resumed at that time.

## 2021-08-22 NOTE — Anesthesia Postprocedure Evaluation (Signed)
Anesthesia Post Note  Patient: [name]  Procedure(s) Performed: LEFT TRANSMETATARSAL AMPUTATION REVISION (Left: Foot) APPLICATION OF WOUND VAC (Left: Foot)     Patient location during evaluation: PACU Anesthesia Type: Regional Level of consciousness: awake and alert Pain management: pain level controlled Vital Signs Assessment: post-procedure vital signs reviewed and stable Respiratory status: spontaneous breathing, nonlabored ventilation and respiratory function stable Cardiovascular status: blood pressure returned to baseline and stable Postop Assessment: no apparent nausea or vomiting Anesthetic complications: no   No notable events documented.  Last Vitals:  Vitals:   09/08/2021 1145 09/08/2021 1150  BP: (!) 92/28 (!) 97/30  Pulse: 86 85  Resp: 11 13  Temp:    SpO2: 95% 98%    Last Pain:  Vitals:   09/17/2021 1150  PainSc: 4                  Lowella Curb

## 2021-08-22 NOTE — Progress Notes (Signed)
   S/P left TMA revision with wound vac placement Wound vac re enforced in PACU good seal  Pain well controlled  Stable post op disposition  Mosetta Pigeon PA-C

## 2021-08-22 NOTE — Anesthesia Procedure Notes (Signed)
Anesthesia Regional Block: Ankle block   Pre-Anesthetic Checklist: , timeout performed,  Correct Patient, Correct Site, Correct Laterality,  Correct Procedure, Correct Position, site marked,  Risks and benefits discussed,  Surgical consent,  Pre-op evaluation,  At surgeon's request and post-op pain management  Laterality: Left and Lower  Prep: chloraprep       Needles:  Injection technique: Single-shot  Needle Type: Quincke     Needle Length: 4cm  Needle Gauge: 25     Additional Needles:    (perineural infiltration)   Narrative:  Start time: 09-17-21 9:07 AM End time: September 17, 2021 9:13 AM Injection made incrementally with aspirations every 5 mL.  Performed by: Personally  Anesthesiologist: Jairo Ben, MD  Additional Notes: Pt identified in Holding room.  Monitors applied. Working IV access confirmed. Sterile prep L ankle.  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  Total 40cc 0.5% Bupivacaine injected incrementally after negative aspiration.  Patient asymptomatic, VSS, no heme aspirated, tolerated well.   Sandford Craze, MD

## 2021-08-22 NOTE — Plan of Care (Signed)

## 2021-08-22 NOTE — H&P (Signed)
History and Physical Interval Note:  2021/08/24 9:18 AM  [name]  has presented today for surgery, with the diagnosis of PAD, NON-HEALING SURGICAL WOUND.  The various methods of treatment have been discussed with the patient and family. After consideration of risks, benefits and other options for treatment, the patient has consented to  Procedure(s): LEFT OPEN TRANSMETATARSAL AMPUTATION (Left) as a surgical intervention.  The patient's history has been reviewed, patient examined, no change in status, stable for surgery.  I have reviewed the patient's chart and labs.  Questions were answered to the patient's satisfaction.    Non-healing left TMA.  Plan open left TMA.  Discussed if this does not heal will need higher level amputation.   Cephus Shelling  Patient ID: [name], male   DOB: May 28, 1946, 75 y.o.   MRN: [id number]   Reason for Consult: Wound Check   [facility]   Subjective:    Subjective [] Expand by Default   HPI:   [name] is a 75 y.o. male history of left transmetatarsal amputation approximately 1 month ago.  He was just seen last week in our office for nonhealing left transmetatarsal amputation.  He has been wearing his Darco shoe.  He does walk.  He denies fevers and chills.        Past Medical History:  Diagnosis Date   CAD, AUTOLOGOUS BYPASS GRAFT     Depression     DIABETES, TYPE 2     DYSPNEA      on exertion   HYPERLIPIDEMIA     OBESITY     OBSTRUCTIVE SLEEP APNEA      cpap   Old myocardial infarction 2005   Peripheral vascular disease (HCC)     PTSD (post-traumatic stress disorder)     Stroke (HCC) 09/2007    light stroke         Family History  Problem Relation Age of Onset   Diabetes Mother     Alcoholism Father           Past Surgical History:  Procedure Laterality Date   ABDOMINAL AORTOGRAM W/LOWER EXTREMITY Bilateral 03/10/2021    Procedure: ABDOMINAL AORTOGRAM W/LOWER EXTREMITY;  Surgeon:  03/12/2021, MD;  Location: [facility] INVASIVE CV LAB;  Service: Cardiovascular;  Laterality: Bilateral;   ABDOMINAL AORTOGRAM W/LOWER EXTREMITY N/A 07/14/2021    Procedure: ABDOMINAL AORTOGRAM W/LOWER EXTREMITY;  Surgeon: 07/16/2021, MD;  Location: [facility] INVASIVE CV LAB;  Service: Cardiovascular;  Laterality: N/A;   AMPUTATION Left 03/18/2021    Procedure: LEFT FOURTH TOE AMPUTATION;  Surgeon: 03/20/2021, MD;  Location: [facility] OR;  Service: Vascular;  Laterality: Left;   APPENDECTOMY   1965   CORONARY ARTERY BYPASS GRAFT   2008    at [place]   cyst remove on lelt leg,posterior       LOWER EXTREMITY ANGIOGRAPHY N/A 03/07/2018    Procedure: LOWER EXTREMITY ANGIOGRAPHY;  Surgeon: 03/09/2018, MD;  Location: [facility] INVASIVE CV LAB;  Service: Cardiovascular;  Laterality: N/A;   LOWER EXTREMITY ANGIOGRAPHY Bilateral 05/29/2019    Procedure: LOWER EXTREMITY ANGIOGRAPHY;  Surgeon: 07/29/2019, MD;  Location: [facility] INVASIVE CV LAB;  Service: Cardiovascular;  Laterality: Bilateral;   PERIPHERAL VASCULAR ATHERECTOMY Right 03/07/2018    Procedure: PERIPHERAL VASCULAR ATHERECTOMY;  Surgeon: 03/09/2018, MD;  Location: [facility] INVASIVE CV LAB;  Service: Cardiovascular;  Laterality: Right;  anterioer tibial   TRANSMETATARSAL AMPUTATION Left 07/16/2021  Procedure: LEFT TRANSMETATARSAL AMPUTATION;  Surgeon: Maeola Harman, MD;  Location: [facility] OR;  Service: Vascular;  Laterality: Left;   VASECTOMY       WOUND DEBRIDEMENT Left 05/02/2021    Procedure: Left 4th toe amputation site debridement including metatarsal head;  Surgeon: Cephus Shelling, MD;  Location: [facility] OR;  Service: Vascular;  Laterality: Left;      Short Social History:  Social History         Tobacco Use   Smoking status: Former      Types: Pipe   Smokeless tobacco: Never  Substance Use Topics   Alcohol use: Never           Allergies  Allergen Reactions   Ace  Inhibitors        Heart attack    Lisinopril        Heart attack            Current Outpatient Medications  Medication Sig Dispense Refill   acetaminophen (TYLENOL) 500 MG tablet Take 1 tablet (500 mg total) by mouth every 4 (four) hours as needed for moderate pain (pain). 30 tablet 0   aspirin EC 325 MG tablet Take 325 mg by mouth in the morning.       atorvastatin (LIPITOR) 40 MG tablet Take 40 mg by mouth in the morning.       Cholecalciferol 2000 units TABS Take 2,000 Units by mouth in the morning.       clopidogrel (PLAVIX) 75 MG tablet Take 75 mg by mouth in the morning.       Coenzyme Q10 (COQ-10) 200 MG CAPS Take 200 mg by mouth in the morning.       DULoxetine (CYMBALTA) 30 MG capsule Take 30 mg by mouth daily.       furosemide (LASIX) 40 MG tablet Take 40 mg by mouth in the morning.       Green Tea, Camellia sinensis, (GREEN TEA EXTRACT PO) Take 400 mg by mouth 2 (two) times a day.       insulin aspart protamine- aspart (NOVOLOG MIX 70/30) (70-30) 100 UNIT/ML injection Inject 30 Units into the skin in the morning and at bedtime.       metFORMIN (GLUCOPHAGE) 1000 MG tablet Take 1,000 mg by mouth in the morning and at bedtime.       metoprolol tartrate (LOPRESSOR) 50 MG tablet Take 50 mg by mouth 2 (two) times daily.       Misc Natural Products (NEURIVA) CAPS Take 1 capsule by mouth at bedtime.       Multiple Vitamin (MULTIVITAMIN WITH MINERALS) TABS tablet Take 1 tablet by mouth daily. Men's 50+ 30 tablet 0   nitroGLYCERIN (NITROSTAT) 0.4 MG SL tablet Place 0.4 mg under the tongue every 5 (five) minutes x 3 doses as needed for chest pain.       OVER THE COUNTER MEDICATION Take 1 tablet by mouth 3 (three) times daily with meals. Sugar Blockers       Resveratrol 250 MG CAPS Take 250 mg by mouth daily with lunch.       Semaglutide, 1 MG/DOSE, (OZEMPIC, 1 MG/DOSE,) 4 MG/3ML SOPN Inject 1 mg into the skin every Monday.       traZODone (DESYREL) 50 MG tablet Take 25 mg by mouth at  bedtime.         No current facility-administered medications for this visit.      Review of Systems  Constitutional: Negative for fever.  HENT: HENT negative.  Eyes: Eyes negative.  Cardiovascular: Cardiovascular negative.  GI: Gastrointestinal negative.  Musculoskeletal: Positive for gait problem.  Skin: Positive for wound.  Hematologic: Hematologic/lymphatic negative.  Psychiatric: Psychiatric negative.         Objective:    Objective [] Expand by Default        Vitals:    08/20/21 1437  BP: 119/74  Pulse: 86  Resp: 20  Temp: 98.2 F (36.8 C)  SpO2: 98%      Physical Exam HENT:     Head: Normocephalic.     Nose:     Comments: Wearing a mask Eyes:     Pupils: Pupils are equal, round, and reactive to light.  Pulmonary:     Effort: Pulmonary effort is normal.  Abdominal:     General: Abdomen is flat.     Palpations: Abdomen is soft.  Skin:    Capillary Refill: Capillary refill takes 2 to 3 seconds.  Neurological:     Mental Status: He is alert.     Comments: Numbness left foot  Psychiatric:        Mood and Affect: Mood normal.        Behavior: Behavior normal.        Thought Content: Thought content normal.        Judgment: Judgment normal.      Data: No studies      Assessment/Plan:    Assessment   75yo with nonhealing transmetatarsal amputation.  I was able to probe over the second metatarsal there is foul smell.  Does not appear infected.  We will plan for open transmetatarsal amputation Friday, September 1 with possible wound VAC placement.  He understands that he will need admitted after this procedure.  He remains very high risk for transtibial amputation on the left.           10-13-1990 MD [facility]

## 2021-08-22 NOTE — Progress Notes (Signed)
Pt arrived to 4E from PACU. CHG bath done, tele applied, pt oriented to room and call bell in reach.  Brooke Pace, RN

## 2021-08-22 NOTE — Anesthesia Procedure Notes (Signed)
Procedure Name: MAC Date/Time: 09/17/2021 9:57 AM Performed by: Janace Litten, CRNA Pre-anesthesia Checklist: Patient identified, Emergency Drugs available, Suction available and Patient being monitored Patient Re-evaluated:Patient Re-evaluated prior to induction Oxygen Delivery Method: Nasal cannula

## 2021-08-22 NOTE — Transfer of Care (Signed)
Immediate Anesthesia Transfer of Care Note  Patient: [name]  Procedure(s) Performed: LEFT TRANSMETATARSAL AMPUTATION REVISION (Left: Foot) APPLICATION OF WOUND VAC (Left: Foot)  Patient Location: PACU  Anesthesia Type:GA combined with regional for post-op pain  Level of Consciousness: drowsy, patient cooperative and responds to stimulation  Airway & Oxygen Therapy: Patient Spontanous Breathing  Post-op Assessment: Report given to RN and Post -op Vital signs reviewed and stable  Post vital signs: Reviewed and stable  Last Vitals:  Vitals Value Taken Time  BP 115/49 September 16, 2021 1050  Temp    Pulse 92 09-16-2021 1051  Resp 12 16-Sep-2021 1051  SpO2 96 % 2021-09-16 1051  Vitals shown include unvalidated device data.  Last Pain:  Vitals:   Sep 16, 2021 0713  PainSc: 0-No pain         Complications: No notable events documented.

## 2021-08-22 NOTE — Anesthesia Procedure Notes (Signed)
Procedure Name: LMA Insertion Date/Time: 2021/09/03 10:19 AM Performed by: Drema Pry, CRNA Pre-anesthesia Checklist: Patient identified, Emergency Drugs available, Suction available and Patient being monitored Patient Re-evaluated:Patient Re-evaluated prior to induction Oxygen Delivery Method: Circle System Utilized Preoxygenation: Pre-oxygenation with 100% oxygen Induction Type: IV induction Ventilation: Mask ventilation without difficulty LMA: LMA inserted LMA Size: 3.0 Number of attempts: 1 Placement Confirmation: positive ETCO2 Tube secured with: Tape Dental Injury: Teeth and Oropharynx as per pre-operative assessment

## 2021-08-22 NOTE — Progress Notes (Signed)
Pharmacy Antibiotic Note  [name] is a 75 y.o. male admitted on 17-Sep-2021 with surgical prophylaxis.  Pharmacy has been consulted for vancomycin dosing.  S/p revision of L TMA with negative pressure wound VAC placement today. Afebrile. Scr 1 (CrCl 71 mL/min).   Plan: Will order vancomycin 1250 mg IV every 24 hours (estAUC 474) Monitor renal fx, clinical pic, and levels if continued - zosyn ordered for 3 doses post-op so f/u if stop date needed for vanc order  Height: 5\' 8"  (172.7 cm) Weight: 93.3 kg (205 lb 9.6 oz) IBW/kg (Calculated) : 68.4  Temp (24hrs), Avg:98 F (36.7 C), Min:98 F (36.7 C), Max:98 F (36.7 C)  Recent Labs  Lab 09-17-2021 0738  CREATININE 1.00    Estimated Creatinine Clearance: 71.9 mL/min (by C-G formula based on SCr of 1 mg/dL).    Allergies  Allergen Reactions   Ace Inhibitors     Heart attack    Lisinopril     Heart attack    Antimicrobials this admission: Zosyn 9/2 >>  Vancomycin 9/2 >>   Dose adjustments this admission: N/A  Microbiology results: N/A  Thank you for allowing pharmacy to be a part of this patient's care.  11/2, PharmD, BCCCP Clinical Pharmacist  Phone: [phone] 2021/09/17 5:32 PM  Please check AMION for all [facility] Pharmacy phone numbers After 10:00 PM, call Main Pharmacy [phone]

## 2021-08-22 NOTE — Op Note (Signed)
Date: 2021/09/19  Preoperative diagnosis: Nonhealing left TMA  Postoperative diagnosis: Same  Procedure: Revision of left TMA with negative pressure wound VAC placement  Surgeon: Dr. Cephus Shelling, MD  Assistant: OR staff  Indication: Patient is a 75 year old male who has undergone previous toe amputation subsequently converted to a TMA by Dr. Randie Heinz.  He was seen in the office recently with a nonhealing TMA.  He presents today for planned open TMA with VAC placement.  He will be at high risk for transtibial amputation.  Risk benefits discussed.  Findings: Fishmouth incision made over the existing nonhealing TMA until I could get back to healthy appearing tissue.  All of the metatarsal heads were transected more proximal with oscillating saw.  There was brisk bleeding in the tissue.  No overt purulent drainage noted.  Wound measured 9 cm by 6 cm wide and 3 cm deep.  Wound was left open with negative pressure wound VAC.  Anesthesia: Regional converted to LMA  Details: Patient was taken to the operating room after informed consent was obtained.  Placed on the operative table in supine position.  After anesthesia induced the left foot was prepped and draped in the usual sterile fashion.  Antibiotics were given and timeout was performed.  I initially made a fishmouth incision over the existing TMA where there was open nonhealing wound.  I transected back all the soft tissue with Bovie cautery taking skin subcutaneous tissue and muscle until I got back to the heads of the metatarsal.  All 5 heads of the metatarsals were transected more proximal with oscillating saw.  I then debrided the tissue with curved Mayo's.  All the tissue appeared viable with copious bleeding.  The wound was irrigated.  A small sponge VAC was placed in the wound with Ioban and a track pad was placed to suction at -125 mm Hg with KCI VAC.  Complications: None  Condition: Stable  Cephus Shelling, MD [facility] [place] Office: [phone]   Cephus Shelling

## 2021-08-23 ENCOUNTER — Encounter (HOSPITAL_COMMUNITY): Payer: Self-pay | Admitting: Vascular Surgery

## 2021-08-23 LAB — BASIC METABOLIC PANEL
Anion gap: 6 (ref 5–15)
BUN: 10 mg/dL (ref 8–23)
CO2: 31 mmol/L (ref 22–32)
Calcium: 8.9 mg/dL (ref 8.9–10.3)
Chloride: 101 mmol/L (ref 98–111)
Creatinine, Ser: 1.12 mg/dL (ref 0.61–1.24)
GFR, Estimated: >60 mL/min (ref >60)
Glucose, Bld: 61 mg/dL — ABNORMAL LOW (ref 70–99)
Potassium: 4 mmol/L (ref 3.5–5.1)
Sodium: 138 mmol/L (ref 135–145)

## 2021-08-23 LAB — CBC
HCT: 40.7 % (ref 39.0–52.0)
Hemoglobin: 13.2 g/dL (ref 13.0–17.0)
MCH: 31.6 pg (ref 26.0–34.0)
MCHC: 32.4 g/dL (ref 30.0–36.0)
MCV: 97.4 fL (ref 80.0–100.0)
Platelets: 216 10*3/uL (ref 150–400)
RBC: 4.18 MIL/uL — ABNORMAL LOW (ref 4.22–5.81)
RDW: 14.2 % (ref 11.5–15.5)
WBC: 10.7 10*3/uL — ABNORMAL HIGH (ref 4.0–10.5)
nRBC: 0 % (ref 0.0–0.2)

## 2021-08-23 LAB — GLUCOSE, CAPILLARY
Glucose-Capillary: 70 mg/dL (ref 70–99)
Glucose-Capillary: 76 mg/dL (ref 70–99)
Glucose-Capillary: 134 mg/dL — ABNORMAL HIGH (ref 70–99)
Glucose-Capillary: 133 mg/dL — ABNORMAL HIGH (ref 70–99)

## 2021-08-23 MED ORDER — METHOCARBAMOL 500 MG PO TABS
500.0000 mg | ORAL_TABLET | Freq: Three times a day (TID) | ORAL | Status: DC
  Administered 2021-08-23 – 2021-08-25 (×9): 500 mg via ORAL
  Filled 2021-08-23 (×9): qty 1

## 2021-08-23 MED ORDER — OXYCODONE-ACETAMINOPHEN 5-325 MG PO TABS
1.0000 | ORAL_TABLET | ORAL | Status: DC | PRN
  Administered 2021-08-23 – 2021-08-25 (×9): 2 via ORAL
  Filled 2021-08-23 (×9): qty 2

## 2021-08-23 NOTE — Evaluation (Signed)
Physical Therapy Evaluation Patient Details Name: [name] MRN: [id number] DOB: Jun 25, 1946 Today's Date: 08/23/2021   History of Present Illness  Pt is a 75 year old man who presented due to previous toe amputation subsequently converted to a TMA. Pt then on 9/2 had a Revision of left TMA with negative pressure wound VAC placement. PMH: MI, DM II, HLD, PVD, CVA.   Clinical Impression  Pt in bed upon arrival of PT, agreeable to evaluation at this time. Prior to admission the pt was mobilizing with use of SPC for mobility in the home and community, living in a home with 2 steps to enter with his wife who he states is able to provide 24/7 supervision/assist. The pt now presents with limitations in functional mobility, activity tolerance, and dynamic stability due to above dx and resulting pain, and will continue to benefit from skilled PT to address these deficits. Despite initially benefiting from minA to power up to standing, the pt was able to complete remaining sit-stand transfers with minG and VC for hand positioning and use of RW. The pt was able to complete pivoting steps at this time with good adherence to wt bearing, but limited in progression of activity due to pain at this time. The pt will likely progress well with continued pain management and progression with acute PT, but may require short stint SNF rehab if wife unable to provide supervision and some physical assist.      Follow Up Recommendations Home health PT;Supervision for mobility/OOB    Equipment Recommendations  None recommended by PT    Recommendations for Other Services       Precautions / Restrictions Precautions Precautions: Fall Precaution Comments: LLE wound vac and darco shoe Restrictions Weight Bearing Restrictions: Yes LLE Weight Bearing: Partial weight bearing LLE Partial Weight Bearing Percentage or Pounds: through heeel only in darco shoe Other Position/Activity Restrictions: donned DARCO shoe       Mobility  Bed Mobility Overal bed mobility: Needs Assistance Bed Mobility: Supine to Sit     Supine to sit: Min guard;HOB elevated     General bed mobility comments: pt cued to pace    Transfers Overall transfer level: Needs assistance Equipment used: Rolling walker (2 wheeled) Transfers: Sit to/from Stand Sit to Stand: Min assist;Min guard         General transfer comment: progressed from minA initially to minG with reps, x through session  Ambulation/Gait Ambulation/Gait assistance: Min guard Gait Distance (Feet): 5 Feet Assistive device: Rolling walker (2 wheeled) Gait Pattern/deviations: Step-to pattern;Decreased stride length;Decreased weight shift to right;Trunk flexed     General Gait Details: the pt was able to complete multiple sort bouts of steppingwith BUE support on RW and darco shoe to reduce wt bearing on LLE. no LOB    Balance Overall balance assessment: Needs assistance Sitting-balance support: Feet supported Sitting balance-Leahy Scale: Fair     Standing balance support: Bilateral upper extremity supported Standing balance-Leahy Scale: Poor Standing balance comment: to follow WB precautions requires BUE support                             Pertinent Vitals/Pain Pain Assessment: No/denies pain Pain Score: 10-Worst pain ever Pain Location: L foot Pain Descriptors / Indicators: Constant;Moaning;Sharp Pain Intervention(s): Limited activity within patient's tolerance;Monitored during session;Premedicated before session;Repositioned    Home Living Family/patient expects to be discharged to:: Private residence Living Arrangements: Spouse/significant other Available Help at Discharge: Family;Available 24  hours/day Type of Home: House Home Access: Stairs to enter Entrance Stairs-Rails: None Entrance Stairs-Number of Steps: 2 small steps Home Layout: One level Home Equipment: Hand held shower head;Cane - single point;Shower seat;Walker  - 2 wheels      Prior Function Level of Independence: Independent with assistive device(s)         Comments: pt has been walking 20 min a day with use of cane     Hand Dominance   Dominant Hand: Right    Extremity/Trunk Assessment   Upper Extremity Assessment Upper Extremity Assessment: Defer to OT evaluation    Lower Extremity Assessment Lower Extremity Assessment: Overall WFL for tasks assessed;LLE deficits/detail LLE Deficits / Details: LLE s/p TMA. with wound vac in place. limited by pain. pt reporting numbness to ankle, cold sensation in foot "like ice" LLE: Unable to fully assess due to pain LLE Sensation: decreased light touch    Cervical / Trunk Assessment Cervical / Trunk Assessment: Normal  Communication   Communication: No difficulties  Cognition Arousal/Alertness: Awake/alert Behavior During Therapy: Anxious;Restless;Impulsive (pt with decreased impulsivity following successful BM) Overall Cognitive Status: Within Functional Limits for tasks assessed                                        General Comments General comments (skin integrity, edema, etc.): HR 90-120 with activity, pt anxious and restless due to pain, able to complete movements safely    Exercises     Assessment/Plan    PT Assessment Patient needs continued PT services  PT Problem List Decreased strength;Decreased activity tolerance;Decreased balance;Decreased mobility;Decreased safety awareness;Pain       PT Treatment Interventions DME instruction;Gait training;Stair training;Functional mobility training;Therapeutic activities;Therapeutic exercise;Balance training;Patient/family education    PT Goals (Current goals can be found in the Care Plan section)  Acute Rehab PT Goals Patient Stated Goal: to have less pain PT Goal Formulation: With patient Time For Goal Achievement: 09/06/21 Potential to Achieve Goals: Good    Frequency Min 3X/week   Barriers to discharge         Co-evaluation PT/OT/SLP Co-Evaluation/Treatment: Yes Reason for Co-Treatment: Complexity of the patient's impairments (multi-system involvement);Necessary to address cognition/behavior during functional activity;For patient/therapist safety;To address functional/ADL transfers (pain limiting) PT goals addressed during session: Mobility/safety with mobility;Balance;Proper use of DME;Strengthening/ROM OT goals addressed during session: ADL's and self-care       AM-PAC PT "6 Clicks" Mobility  Outcome Measure Help needed turning from your back to your side while in a flat bed without using bedrails?: A Little Help needed moving from lying on your back to sitting on the side of a flat bed without using bedrails?: A Little Help needed moving to and from a bed to a chair (including a wheelchair)?: A Little Help needed standing up from a chair using your arms (e.g., wheelchair or bedside chair)?: A Little Help needed to walk in hospital room?: A Little Help needed climbing 3-5 steps with a railing? : A Little 6 Click Score: 18    End of Session Equipment Utilized During Treatment: Gait belt Activity Tolerance: Patient tolerated treatment well;Patient limited by pain Patient left: in chair;with call bell/phone within reach;with chair alarm set Nurse Communication: Mobility status PT Visit Diagnosis: Other abnormalities of gait and mobility (R26.89);Pain Pain - Right/Left: Left Pain - part of body: Ankle and joints of foot    Time: [id number] PT Time Calculation (min) (ACUTE  ONLY): 25 min   Charges:   PT Evaluation $PT Eval Low Complexity: 1 Low          Vickki Muff, PT, DPT   Acute Rehabilitation Department Pager #: [phone]  Ronnie Derby 08/23/2021, 2:54 PM

## 2021-08-23 NOTE — Progress Notes (Signed)
Inpatient Diabetes Program Recommendations  AACE/ADA: New Consensus Statement on Inpatient Glycemic Control (2015)  Target Ranges:  Prepandial:   less than 140 mg/dL      Peak postprandial:   less than 180 mg/dL (1-2 hours)      Critically ill patients:  140 - 180 mg/dL   Lab Results  Component Value Date   GLUCAP 70 08/23/2021   HGBA1C 7.2 (H) 07/11/2021    Review of Glycemic Control Results for [name], [name] (MRN [id number]) as of 08/23/2021 10:35  Ref. Range September 09, 2021 06:59 09/09/21 10:52 Sep 09, 2021 18:26 2021-09-09 21:05 08/23/2021 06:19  Glucose-Capillary Latest Ref Range: 70 - 99 mg/dL 916 (H) 606 (H) 004 (H) 255 (H) 70   Diabetes history: DM 2 Outpatient Diabetes medications: Ozempic 1 mg QMondays, 70/30 30 units bid, metformin 1000 mg tid Current orders for Inpatient glycemic control:  70/30 30 units bid Metformin 1000 mg tid Ozempic 1 mg Weekly on Mondays Novolog 0-15 units tid  Inpatient Diabetes Program Recommendations:    Glucose trends lower this am at 70, may need to decrease 70/30 dose to 25 units bid.   Will follow glucose trends while inpatient  Thanks,  Christena Deem RN, MSN, BC-ADM Inpatient Diabetes Coordinator Team Pager [phone] (8a-5p)

## 2021-08-23 NOTE — Evaluation (Signed)
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 Pt then on 9/2 had a Revision of left TMA with negative pressure wound VAC placement. PMH: MI, DM II, HLD, PVD, CVA.   Clinical Impression   Pt presented in bed and reported his LLE feels cold and is having shooting pain but also constant pain in LLE. Pt was able to complete bed mobility, sit to stand transfers to [facility] with RW and min assist to then min guard due to safety and line management. Pt required total to max assist for hygiene while standing. Pt reports his family will be able to assist 24/7 with the return to home. However, if this changes they will need increase in therapy services due to level of function. Pt currently with functional limitations due to the deficits listed below (see OT Problem List).  Pt will benefit from skilled OT to increase their safety and independence with ADL and functional mobility for ADL to facilitate discharge to venue listed below.      Follow Up Recommendations  Home health OT;Supervision/Assistance - 24 hour;Other (comment) (Pt reports that the wife can give some physical assistance and able to assist 24/7 if this changes then will require increase level of therapy to SNF)    Equipment Recommendations  3 in 1 bedside commode;Tub/shower bench    Recommendations for Other Services       Precautions / Restrictions Precautions Precautions: Fall Precaution Comments: LLE recent revision of left TMA with negative pressure wound VAC placement, donned Darco shoe Restrictions Weight Bearing Restrictions: Yes LLE Weight Bearing: Non weight bearing Other Position/Activity Restrictions: donned DARCO shoe      Mobility Bed Mobility Overal bed mobility: Needs Assistance Bed Mobility: Supine to  Sit     Supine to sit: Min guard;HOB elevated     General bed mobility comments: pt cued to pace    Transfers Overall transfer level: Needs assistance Equipment used: Rolling walker (2 wheeled) Transfers: Sit to/from Stand Sit to Stand: Min assist (however, as they continue to complete transfers then required min guard for saftey)              Balance Overall balance assessment: Needs assistance Sitting-balance support: Feet supported Sitting balance-Leahy Scale: Fair     Standing balance support: Bilateral upper extremity supported Standing balance-Leahy Scale: Poor Standing balance comment: to follow WB precautions requires BUE support                           ADL either performed or assessed with clinical judgement   ADL Overall ADL's : Needs assistance/impaired Eating/Feeding: Independent;Sitting   Grooming: Wash/dry hands;Wash/dry face;Set up;Sitting;Cueing for safety;Cueing for sequencing   Upper Body Bathing: Min guard;Cueing for safety;Cueing for sequencing;Sitting   Lower Body Bathing: Moderate assistance;Cueing for safety;Cueing for sequencing;Sitting/lateral leans   Upper Body Dressing : Min guard;Cueing for safety;Cueing for sequencing;Sitting   Lower Body Dressing: Moderate assistance;Cueing for safety;Cueing for sequencing;Sitting/lateral leans   Toilet Transfer: Min guard;Cueing for safety;Cueing for sequencing;Ambulation;BSC   Toileting- Clothing Manipulation and Hygiene: Cueing for safety;Cueing for sequencing;Sit to/from stand;Maximal assistance       Functional mobility during ADLs: Min guard;Rolling walker;Cueing for safety;Cueing for sequencing       Vision Baseline Vision/History: 1 Wears glasses Ability to See  in Adequate Light: 0 Adequate Patient Visual Report: No change from baseline Vision Assessment?: No apparent visual deficits     Perception     Praxis      Pertinent Vitals/Pain Pain Assessment: 0-10 Pain  Score: 10-Worst pain ever Pain Location: L foot Pain Descriptors / Indicators: Constant;Moaning;Sharp Pain Intervention(s): Limited activity within patient's tolerance;Monitored during session;Premedicated before session     Hand Dominance Right   Extremity/Trunk Assessment Upper Extremity Assessment Upper Extremity Assessment: Overall WFL for tasks assessed   Lower Extremity Assessment Lower Extremity Assessment: Defer to PT evaluation   Cervical / Trunk Assessment Cervical / Trunk Assessment: Normal   Communication Communication Communication: No difficulties   Cognition Arousal/Alertness: Awake/alert Behavior During Therapy: Anxious;Restless;Impulsive (pt has decrease in impulsive movements once they had BM) Overall Cognitive Status: Within Functional Limits for tasks assessed                                     General Comments       Exercises     Shoulder Instructions      Home Living Family/patient expects to be discharged to:: Private residence Living Arrangements: Spouse/significant other Available Help at Discharge: Family;Available 24 hours/day Type of Home: House Home Access: Stairs to enter [employer] of Steps: 2 small steps Entrance Stairs-Rails: None Home Layout: One level     Bathroom Shower/Tub: Chief Strategy Officer: Standard     Home Equipment: Hand held shower head;Cane - single point;Shower seat;Walker - 2 wheels          Prior Functioning/Environment Level of Independence: Independent with assistive device(s)        Comments: pt has been walking 20 min a day with use of cane        OT Problem List: Decreased strength;Decreased range of motion;Decreased activity tolerance;Impaired balance (sitting and/or standing);Decreased safety awareness;Decreased knowledge of use of DME or AE;Decreased knowledge of precautions;Pain      OT Treatment/Interventions: Self-care/ADL training;Therapeutic  exercise;DME and/or AE instruction;Therapeutic activities;Patient/family education;Balance training    OT Goals(Current goals can be found in the care plan section) Acute Rehab OT Goals Patient Stated Goal: to have less pain OT Goal Formulation: With patient Time For Goal Achievement: 09/06/21 Potential to Achieve Goals: Good ADL Goals Pt Will Perform Upper Body Bathing: Independently;sitting Pt Will Perform Lower Body Bathing: with set-up;sit to/from stand Pt Will Transfer to Toilet: with supervision;ambulating;regular height toilet;grab bars  OT Frequency: Min 2X/week   Barriers to D/C:            Co-evaluation PT/OT/SLP Co-Evaluation/Treatment: Yes Reason for Co-Treatment: Complexity of the patient's impairments (multi-system involvement)   OT goals addressed during session: ADL's and self-care      AM-PAC OT "6 Clicks" Daily Activity     Outcome Measure Help from another person eating meals?: None Help from another person taking care of personal grooming?: A Little Help from another person toileting, which includes using toliet, bedpan, or urinal?: A Lot Help from another person bathing (including washing, rinsing, drying)?: A Lot Help from another person to put on and taking off regular upper body clothing?: A Little Help from another person to put on and taking off regular lower body clothing?: A Lot 6 Click Score: 16   End of Session Equipment Utilized During Treatment: Gait belt;Rolling walker Nurse Communication: Mobility status  Activity Tolerance: Patient limited by pain;Patient tolerated treatment well Patient left:  in chair;with call bell/phone within reach;with chair alarm set  OT Visit Diagnosis: Unsteadiness on feet (R26.81);Other abnormalities of gait and mobility (R26.89);Muscle weakness (generalized) (M62.81);Pain Pain - Right/Left: Left Pain - part of body: Leg                Time: 1335-1403 OT Time Calculation (min): 28 min Charges:  OT General  Charges $OT Visit: 1 Visit OT Evaluation $OT Eval Low Complexity: 1 Low  Alphia Moh OTR/L  Acute Rehab Services  [phone] office number [phone] pager number   Alphia Moh 08/23/2021, 2:29 PM

## 2021-08-23 NOTE — Progress Notes (Addendum)
[facility]  Subjective  - painful muscle spasms left LE.  He did not get much rest.    Objective (!) 113/45 96 98 F (36.7 C) (Oral) 20 99%  Intake/Output Summary (Last 24 hours) at 08/23/2021 0749 Last data filed at 08/23/2021 6283 Gross per 24 hour  Intake 800 ml  Output 2480 ml  Net -1680 ml    Left TMA wound vac with good seal Op 525 cc bloody drainage Doppler signal AT Lungs non labored breathing  Assessment/Planning: POD #left TMA revision with open wound and wound vac placement The vac will be changed Monday I have added Roabaxin for help with muscle spasms I also D/C Hydrocodone and started Oxycodone to get better pain control. Leukocytosis managed with Vanco/Zosyn  He remains very high risk for transtibial amputation on the left.  Mosetta Pigeon 08/23/2021 7:49 AM --  Laboratory Lab Results: Recent Labs    08/21/2021 1741 08/23/21 0040  WBC 6.7 10.7*  HGB 12.6* 13.2  HCT 38.5* 40.7  PLT 194 216   BMET Recent Labs    09/07/2021 0738 09/09/2021 1741 08/23/21 0040  NA 140  --  138  K 4.7  --  4.0  CL 101  --  101  CO2  --   --  31  GLUCOSE 100*  --  61*  BUN 14  --  10  CREATININE 1.00 1.10 1.12  CALCIUM  --   --  8.9    COAG Lab Results  Component Value Date   INR 1.4 09/22/2007   INR 1.0 09/17/2007   INR 0.9 09/16/2007   No results found for: PTT  I have interviewed the patient and examined the patient. I agree with the findings by the PA.Vac has good seal. For VAC change Monday. I reviewed his arteriogram. He has no options for revascularization on the left.   Cari Caraway, MD

## 2021-08-23 NOTE — Progress Notes (Signed)
Mobility Specialist: Progress Note   08/23/21 1618  Mobility  Activity Ambulated in hall  Level of Assistance Contact guard assist, steadying assist  Assistive Device Front wheel walker  Distance Ambulated (ft) 250 ft  Mobility Ambulated with assistance in hallway  Mobility Response Tolerated well  Mobility performed by Mobility specialist  $Mobility charge 1 Mobility   Pre-Mobility: 99 HR, 136/107 BP, 99% SpO2 Post-Mobility: 115 HR, 113/36 BP, 95% SpO2  Pt required contact guard to stand as well as during ambulation. Pt c/o 10/10 pain in L foot during ambulation, otherwise asx. Pt to bed after walk with call bell at his side and pt's wife present in the room. Pt requesting pain medication, RN notified.   [facility] [name] Mobility Specialist Mobility Specialist Phone: [phone]

## 2021-08-24 LAB — CBC
HCT: 40.8 % (ref 39.0–52.0)
Hemoglobin: 13.1 g/dL (ref 13.0–17.0)
MCH: 31 pg (ref 26.0–34.0)
MCHC: 32.1 g/dL (ref 30.0–36.0)
MCV: 96.7 fL (ref 80.0–100.0)
Platelets: 222 10*3/uL (ref 150–400)
RBC: 4.22 MIL/uL (ref 4.22–5.81)
RDW: 14.2 % (ref 11.5–15.5)
WBC: 12.8 10*3/uL — ABNORMAL HIGH (ref 4.0–10.5)
nRBC: 0 % (ref 0.0–0.2)

## 2021-08-24 LAB — BASIC METABOLIC PANEL
Anion gap: 15 (ref 5–15)
BUN: 12 mg/dL (ref 8–23)
CO2: 21 mmol/L — ABNORMAL LOW (ref 22–32)
Calcium: 9.1 mg/dL (ref 8.9–10.3)
Chloride: 98 mmol/L (ref 98–111)
Creatinine, Ser: 1.41 mg/dL — ABNORMAL HIGH (ref 0.61–1.24)
GFR, Estimated: 52 mL/min — ABNORMAL LOW (ref >60)
Glucose, Bld: 185 mg/dL — ABNORMAL HIGH (ref 70–99)
Potassium: 6 mmol/L — ABNORMAL HIGH (ref 3.5–5.1)
Sodium: 134 mmol/L — ABNORMAL LOW (ref 135–145)

## 2021-08-24 LAB — GLUCOSE, CAPILLARY
Glucose-Capillary: 182 mg/dL — ABNORMAL HIGH (ref 70–99)
Glucose-Capillary: 101 mg/dL — ABNORMAL HIGH (ref 70–99)
Glucose-Capillary: 64 mg/dL — ABNORMAL LOW (ref 70–99)
Glucose-Capillary: 115 mg/dL — ABNORMAL HIGH (ref 70–99)

## 2021-08-24 MED ORDER — SODIUM ZIRCONIUM CYCLOSILICATE 10 G PO PACK
10.0000 g | PACK | Freq: Two times a day (BID) | ORAL | Status: AC
  Administered 2021-08-24 – 2021-08-25 (×2): 10 g via ORAL
  Filled 2021-08-24 (×2): qty 1

## 2021-08-24 NOTE — Progress Notes (Signed)
Mobility Specialist: Progress Note   08/24/21 1230  Mobility  Activity Ambulated in hall  Level of Assistance Contact guard assist, steadying assist  Assistive Device Front wheel walker  Distance Ambulated (ft) 240 ft  Mobility Ambulated with assistance in hallway  Mobility Response Tolerated well  Mobility performed by Mobility specialist  Bed Position Chair  $Mobility charge 1 Mobility   Pre-Mobility: 80 HR, 137/70 BP, 94% SpO2 During Mobility: 123 HR Post-Mobility: 96 HR, 113/64 BP  Pt contact guard to stand as well as during ambulation. Pt had no c/o pain at the beginning of ambulation but said there was some pain in his L foot after walk d/t muscle spasms. Pt to recliner after walk with call bell at his side and pt's wife present in the room.   [facility] Daniqua Campoy Mobility Specialist Mobility Specialist Phone: [phone]

## 2021-08-24 NOTE — Progress Notes (Signed)
[facility] [place]  Subjective  - Pain control   Objective (!) 113/30 97 97.9 F (36.6 C) (Oral) 14 98%  Intake/Output Summary (Last 24 hours) at 08/24/2021 0721 Last data filed at 08/24/2021 0641 Gross per 24 hour  Intake 510.35 ml  Output 3025 ml  Net -2514.65 ml   Left TMA wound vac to suction Doppler AT signal left LE Lungs non labored breathing   Assessment/Planning: POD # 2 TMA revision Plan to change vac tomorrow If the TMA appears viable he will be discharged with wound vac He remains very high risk for transtibial amputation on the left.  He has no revascularization options. Leukocytosis increased now 12.8 Cont. Vanc     [name] 08/24/2021 7:21 AM --  Laboratory Lab Results: Recent Labs    08/23/21 0040 08/24/21 0054  WBC 10.7* 12.8*  HGB 13.2 13.1  HCT 40.7 40.8  PLT 216 222   BMET Recent Labs    08/23/21 0040 08/24/21 0054  NA 138 134*  K 4.0 6.0*  CL 101 98  CO2 31 21*  GLUCOSE 61* 185*  BUN 10 12  CREATININE 1.12 1.41*  CALCIUM 8.9 9.1    COAG Lab Results  Component Value Date   INR 1.4 09/22/2007   INR 1.0 09/17/2007   INR 0.9 09/16/2007   No results found for: PTT

## 2021-08-25 LAB — CBC
HCT: 39.6 % (ref 39.0–52.0)
Hemoglobin: 12.9 g/dL — ABNORMAL LOW (ref 13.0–17.0)
MCH: 31.6 pg (ref 26.0–34.0)
MCHC: 32.6 g/dL (ref 30.0–36.0)
MCV: 97.1 fL (ref 80.0–100.0)
Platelets: 218 10*3/uL (ref 150–400)
RBC: 4.08 MIL/uL — ABNORMAL LOW (ref 4.22–5.81)
RDW: 14.3 % (ref 11.5–15.5)
WBC: 9.8 10*3/uL (ref 4.0–10.5)
nRBC: 0 % (ref 0.0–0.2)

## 2021-08-25 LAB — BASIC METABOLIC PANEL
Anion gap: 10 (ref 5–15)
BUN: 19 mg/dL (ref 8–23)
CO2: 29 mmol/L (ref 22–32)
Calcium: 9.1 mg/dL (ref 8.9–10.3)
Chloride: 98 mmol/L (ref 98–111)
Creatinine, Ser: 1.28 mg/dL — ABNORMAL HIGH (ref 0.61–1.24)
GFR, Estimated: 59 mL/min — ABNORMAL LOW (ref >60)
Glucose, Bld: 122 mg/dL — ABNORMAL HIGH (ref 70–99)
Potassium: 4.4 mmol/L (ref 3.5–5.1)
Sodium: 137 mmol/L (ref 135–145)

## 2021-08-25 LAB — GLUCOSE, CAPILLARY
Glucose-Capillary: 180 mg/dL — ABNORMAL HIGH (ref 70–99)
Glucose-Capillary: 52 mg/dL — ABNORMAL LOW (ref 70–99)
Glucose-Capillary: 98 mg/dL (ref 70–99)
Glucose-Capillary: 140 mg/dL — ABNORMAL HIGH (ref 70–99)

## 2021-08-25 MED ORDER — CYCLOBENZAPRINE HCL 5 MG PO TABS
5.0000 mg | ORAL_TABLET | Freq: Three times a day (TID) | ORAL | Status: DC | PRN
  Administered 2021-08-25: 5 mg via ORAL
  Filled 2021-08-25 (×3): qty 1

## 2021-08-25 NOTE — Progress Notes (Signed)
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 Pt then on 9/2 had a Revision of left TMA with negative pressure wound VAC placement. PMH: MI, DM II, HLD, PVD, CVA.    PT Comments    The pt was seen for continued progression of OOB mobility and ambulation this afternoon. He was able to demo good progress with gait at this time, completing 2 bouts of 10-20 ft ambulation with use of RW. The pt remains limited by pain in LLE, but was able to maintain WB well with darco shoe. The pt also c/o muscle craps in RLE, discussed importance of remaining hydrated, especially with activity. Pt with some new confusion this session, especially in regards to memory, attention to task, and problem solving. Wife present to confirm this is new from baseline. The pt will continue to benefit from skilled PT acutely to further progress mobility and independence as well as to initiate stair training.    Follow Up Recommendations  Home health PT;Supervision for mobility/OOB     Equipment Recommendations  None recommended by PT    Recommendations for Other Services       Precautions / Restrictions Precautions Precautions: Fall Precaution Comments: LLE wound vac and darco shoe Restrictions Weight Bearing Restrictions: Yes LLE Weight Bearing: Partial weight bearing LLE Partial Weight Bearing Percentage or Pounds: heel wt bearing in darco shoe    Mobility  Bed Mobility Overal bed mobility: Needs Assistance             General bed mobility comments: OOB in recliner upon entry    Transfers Overall transfer level: Needs assistance Equipment used: Rolling walker (2 wheeled) Transfers: Sit to/from Stand Sit to Stand: Min guard         General transfer comment: min guard for safety/balance,  cueing for hand placement and pacing  Ambulation/Gait Ambulation/Gait assistance: Min guard Gait Distance (Feet): 15 Feet (+ 20 ft) Assistive device: Rolling walker (2 wheeled) Gait Pattern/deviations: Step-to pattern;Decreased stride length;Decreased weight shift to right;Trunk flexed Gait velocity: decreased   General Gait Details: pt completed 10 ft in room with seated rest to recover, then 20 additional ft in hallway. slow but steady, needing cues for direction, safety, and to remember goal of task. HR to 130bpm SpO2 95% on RA     Balance Overall balance assessment: Needs assistance Sitting-balance support: No upper extremity supported;Feet supported Sitting balance-Leahy Scale: Fair     Standing balance support: Bilateral upper extremity supported;Single extremity supported;During functional activity Standing balance-Leahy Scale: Poor Standing balance comment: relies on BUE Support dynamcially                            Cognition Arousal/Alertness: Awake/alert Behavior During Therapy: Impulsive;Anxious Overall Cognitive Status: Impaired/Different from baseline Area of Impairment: Memory;Attention;Problem solving                   Current Attention Level: Focused Memory: Decreased recall of precautions;Decreased short-term memory       Problem Solving: Slow processing;Difficulty sequencing;Requires verbal cues General Comments: pt needing cues for safety, to slow movements, and for directioning. Pt with new onset confusion for example: pt asked to wear mask in hallway for ambulation and then would come back to room to eat his lunch, the  pt then removed his mask stating he wouldn't be able to eat with it on.      Exercises      General Comments General comments (skin integrity, edema, etc.): VSS on RA, HR elevation with progressed ambulation. pt c/o R quad muscle cramp, has only consumed mountain dew today, educated on importance of water and remaining  hydrated      Pertinent Vitals/Pain Pain Assessment: 0-10 Pain Score: 6  Pain Location: L foot Pain Descriptors / Indicators: Discomfort;Operative site guarding Pain Intervention(s): Limited activity within patient's tolerance;Monitored during session;Repositioned     PT Goals (current goals can now be found in the care plan section) Acute Rehab PT Goals Patient Stated Goal: to have less pain PT Goal Formulation: With patient Time For Goal Achievement: 09/06/21 Potential to Achieve Goals: Good Progress towards PT goals: Progressing toward goals    Frequency    Min 3X/week      PT Plan Current plan remains appropriate       AM-PAC PT "6 Clicks" Mobility   Outcome Measure  Help needed turning from your back to your side while in a flat bed without using bedrails?: A Little Help needed moving from lying on your back to sitting on the side of a flat bed without using bedrails?: A Little Help needed moving to and from a bed to a chair (including a wheelchair)?: A Little Help needed standing up from a chair using your arms (e.g., wheelchair or bedside chair)?: A Little Help needed to walk in hospital room?: A Little Help needed climbing 3-5 steps with a railing? : A Little 6 Click Score: 18    End of Session Equipment Utilized During Treatment: Gait belt Activity Tolerance: Patient tolerated treatment well;Patient limited by pain Patient left: in chair;with call bell/phone within reach;with chair alarm set Nurse Communication: Mobility status PT Visit Diagnosis: Other abnormalities of gait and mobility (R26.89);Pain Pain - Right/Left: Left Pain - part of body: Ankle and joints of foot     Time: 0881-1031 PT Time Calculation (min) (ACUTE ONLY): 23 min  Charges:  $Gait Training: 8-22 mins $Therapeutic Activity: 8-22 mins                     Vickki Muff, PT, DPT   Acute Rehabilitation Department Pager #: [phone]   Ronnie Derby 08/25/2021, 1:55 PM

## 2021-08-25 NOTE — Progress Notes (Addendum)
[facility] [place]  Subjective  - Neck and back hurt-bed is uncomfortable.   Objective (!) 101/39 83 97.6 F (36.4 C) 15 99%  Intake/Output Summary (Last 24 hours) at 08/25/2021 0817 Last data filed at 08/25/2021 9622 Gross per 24 hour  Intake --  Output 925 ml  Net -925 ml    Incision site appears viable Vac replaced with good suction Doppler AT signal left LE Lungs non labored breathing Heart PVC, in and out of Afib   Assessment/Planning: POD # 3 revision left TMA VAC change MWF will arrange [facility] RN Pending new labs this am.  Hypokalemia yesterday treated. Patient [name] SOB or CP.  He has a history of MI.  Will order EKG and look at labs.    [name] 08/25/2021 8:17 AM --  Laboratory Lab Results: Recent Labs    08/23/21 0040 08/24/21 0054  WBC 10.7* 12.8*  HGB 13.2 13.1  HCT 40.7 40.8  PLT 216 222   BMET Recent Labs    08/23/21 0040 08/24/21 0054  NA 138 134*  K 4.0 6.0*  CL 101 98  CO2 31 21*  GLUCOSE 61* 185*  BUN 10 12  CREATININE 1.12 1.41*  CALCIUM 8.9 9.1    COAG Lab Results  Component Value Date   INR 1.4 09/22/2007   INR 1.0 09/17/2007   INR 0.9 09/16/2007   No results found for: PTT  I have interviewed the patient and examined the patient. I agree with the findings by the PA. Continue VAC. Despite severe tibial disease the wound looks reasonable.   Cari Caraway, MD

## 2021-08-25 NOTE — TOC Initial Note (Signed)
Transition of Care (TOC) - Initial/Assessment Note  Donn Pierini RN, BSN Transitions of Care Unit 4E- RN Case Manager See Treatment Team for direct phone #    Patient Details  Name: [name] MRN: [id number] Date of Birth: 1946/02/22  Transition of Care [facility]) CM/SW Contact:    Darrold Span, RN Phone Number: 08/25/2021, 11:34 AM  Clinical Narrative:                 Noted order for [facility], pt from home s/p TMA with wound vac placement.  Pt will need [employer] order form signed for home wound VAC- form has been placed on shadow chart for signature and once singed will fax in to [employer] to start insurance process.   CM spoke with pt at bedside to discuss transition needs. Pt aware that he will need home wound VAC drsg. Discussed process for approval. Pt voiced that he has both VA and Medicare benefits- however pt states we would like to see if he can go through the [place] for his VAC and HH needs. Since today is a holiday and the [place] is closed will have to wait and contact the [place] tomorrow. Pt goes to the [facility] for primary care needs. Per pt he has RW and cane at home does not feel he will need any other DME at this time.   List provided to pt for [facility] choice- Per CMS guidelines from medicare.gov website with star ratings (copy placed in shadow chart) Per pt he has used [facility] in the past and would like to see if the [place] would approve and if they can provide needed services for home wound VAC needs.  TOC will f/u tomorrow for transition needs- home wound VAC and HHRN.   Expected Discharge Plan: Home w Home Health Services Barriers to Discharge: Continued Medical Work up   Patient Goals and CMS Choice Patient states their goals for this hospitalization and ongoing recovery are:: return home CMS Medicare.gov Compare Post Acute Care list provided to:: Patient Choice offered to / list presented to : Patient  Expected Discharge Plan and Services Expected Discharge Plan: Home w Home  Health Services   Discharge Planning Services: CM Consult Post Acute Care Choice: Durable Medical Equipment, Home Health Living arrangements for the past 2 months: Single Family Home                 DME Arranged: Vac DME Agency: [employer] Date DME Agency Contacted: 08/26/21   Representative spoke with at DME Agency: [facility] HH Arranged: RN HH Agency: [facility]        Prior Living Arrangements/Services Living arrangements for the past 2 months: Single Family Home Lives with:: Self, Spouse Patient language and need for interpreter reviewed:: Yes Do you feel safe going back to the place where you live?: Yes      Need for Family Participation in Patient Care: Yes (Comment) Care giver support system in place?: No (comment) Current home services: DME Criminal Activity/Legal Involvement Pertinent to Current Situation/Hospitalization: No - Comment as needed  Activities of Daily Living Home Assistive Devices/Equipment: Eyeglasses, Cane (specify quad or straight), Dentures (specify type), CBG Meter, Blood pressure cuff ADL Screening (condition at time of admission) Patient's cognitive ability adequate to safely complete daily activities?: Yes Is the patient deaf or have difficulty hearing?: No Does the patient have difficulty seeing, even when wearing glasses/contacts?: No Does the patient have difficulty concentrating, remembering, or making decisions?: No Patient able to express  need for assistance with ADLs?: Yes Does the patient have difficulty dressing or bathing?: No Independently performs ADLs?: Yes (appropriate for developmental age) Does the patient have difficulty walking or climbing stairs?: No Weakness of Legs: None Weakness of Arms/Hands: None  Permission Sought/Granted Permission sought to share information with : Facility Industrial/product designer granted to share information with : Yes, Verbal Permission Granted     Permission granted to share info w  AGENCY: [facility], [facility], [place]        Emotional Assessment Appearance:: Appears stated age Attitude/Demeanor/Rapport: Engaged Affect (typically observed): Appropriate, Accepting Orientation: : Oriented to Self, Oriented to  Time, Oriented to Place, Oriented to Situation Alcohol / Substance Use: Not Applicable Psych Involvement: No (comment)  Admission diagnosis:  PAD (peripheral artery disease) (HCC) [I73.9] Patient Active Problem List   Diagnosis Date Noted   Status post transmetatarsal amputation of left foot (HCC)    Osteomyelitis (HCC) 07/11/2021   Left foot infection    PAD (peripheral artery disease) (HCC) 06/03/2021   Adjustment disorder with mixed emotional features 03/26/2021   Bronchitis 03/26/2021   CAD (coronary artery disease) 03/26/2021   Erectile dysfunction 03/26/2021   Essential hypertension 03/26/2021   Gout 03/26/2021   PTSD (post-traumatic stress disorder) 03/26/2021   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   Ulcer of foot (HCC) 03/26/2021   Actinic keratosis 03/26/2021   Astigmatism 03/26/2021   Bilateral cataracts 03/26/2021   Carpal tunnel syndrome 03/26/2021   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   Dermatophytosis 03/26/2021   History of other malignant neoplasm of skin 03/26/2021   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   Low back pain 03/26/2021   Mild nonproliferative diabetic retinopathy (HCC) 03/26/2021   Observation and evaluation for suspected exposure to biological agent 03/26/2021   Pain in joint, shoulder region 03/26/2021   Postsurgical percutaneous transluminal coronary angioplasty status 03/26/2021   Presbyopia 03/26/2021   Special screening for malignant neoplasms, colon 03/26/2021   OLD MYOCARDIAL INFARCTION 12/10/2009   CAD, AUTOLOGOUS BYPASS GRAFT 12/10/2009   DYSPNEA 12/10/2009   DM (diabetes mellitus) type II, controlled, with peripheral vascular disorder (HCC) 08/06/2008   HYPERLIPIDEMIA 08/06/2008    OBESITY 08/06/2008   OBSTRUCTIVE SLEEP APNEA 08/06/2008   [facility]P:  [facility]   [id number] [address], [place] - [address] [place] [address] Phone: [phone] Fax: [phone]     Social Determinants of Health (SDOH) Interventions    Readmission Risk Interventions No flowsheet data found.

## 2021-08-25 NOTE — Progress Notes (Signed)
OT Cancellation Note  Patient Details Name: [name] MRN: [id number] DOB: [birth year]   Cancelled Treatment:    Reason Eval/Treat Not Completed: Pain limiting ability to participate- pt with pain and spasms after wound vac change.  RN aware and providing medication. Will follow and see as able.   Barry Brunner, OT Acute Rehabilitation Services Pager [phone] Office [phone]  [name] 08/25/2021, 8:56 AM

## 2021-08-25 NOTE — Progress Notes (Signed)
Occupational Therapy Treatment Patient Details Name: [name] MRN: [id number] DOB: June 17, 1946 Today's Date: 08/25/2021    History of present illness Pt is a 75 year old man who presented due to previous toe amputation subsequently converted to a TMA. Pt then on 9/2 had a Revision of left TMA with negative pressure wound VAC placement. PMH: MI, DM II, HLD, PVD, CVA.   OT comments  Patient seated in recliner and agreeable to OT session. Completing transfers and mobility in room with min guard using RW, min cueing for pacing and safety but good adherence to heel WB to L LE in darco shoe.  Pt requires mod assist for LB dressing, mod assist for toileting; reports spouse is able to assist with ADLs as needed.  Continue to recommend HHOT services at dc.  Will follow acutely.    Follow Up Recommendations  Home health OT;Supervision/Assistance - 24 hour    Equipment Recommendations  3 in 1 bedside commode;Tub/shower bench    Recommendations for Other Services      Precautions / Restrictions Precautions Precautions: Fall Precaution Comments: LLE wound vac and darco shoe Restrictions Weight Bearing Restrictions: Yes LLE Weight Bearing: Partial weight bearing LLE Partial Weight Bearing Percentage or Pounds: Heel weightbearing in darco shoe       Mobility Bed Mobility               General bed mobility comments: OOB in recliner upon entry    Transfers Overall transfer level: Needs assistance Equipment used: Rolling walker (2 wheeled) Transfers: Sit to/from Stand Sit to Stand: Min guard         General transfer comment: min guard for safety/balance, cueing for hand placement and pacing    Balance Overall balance assessment: Needs assistance Sitting-balance support: No upper extremity supported;Feet supported Sitting balance-Leahy Scale: Fair     Standing balance support: Bilateral upper extremity supported;Single extremity supported;During functional  activity Standing balance-Leahy Scale: Poor Standing balance comment: relies on BUE Support dynamcially                           ADL either performed or assessed with clinical judgement   ADL Overall ADL's : Needs assistance/impaired     Grooming: Wash/dry hands;Set up;Sitting               Lower Body Dressing: Moderate assistance;Sit to/from stand Lower Body Dressing Details (indicate cue type and reason): require assist for L darco shoe, relies on BUE support in standing and min guard assist to stand Toilet Transfer: Min guard;Ambulation;RW;Regular Toilet;Grab bars   Toileting- Clothing Manipulation and Hygiene: Moderate assistance;Sit to/from stand Toileting - Clothing Manipulation Details (indicate cue type and reason): for clothing mgmt in standing     Functional mobility during ADLs: Min guard;Rolling walker General ADL Comments: pt limited by pain and activity tolerance     Vision       Perception     Praxis      Cognition Arousal/Alertness: Awake/alert Behavior During Therapy: Impulsive;Anxious Overall Cognitive Status: Within Functional Limits for tasks assessed                                 General Comments: cueing for safety and pacing during impulsivity, but easily redirected        Exercises     Shoulder Instructions       General Comments VSS, on .5L O2  upon entry and doffed for session with SPO2 maintained; pt requested to don at end of session    Pertinent Vitals/ Pain       Pain Assessment: 0-10 Pain Score: 5  Pain Location: L foot Pain Descriptors / Indicators: Discomfort;Operative site guarding Pain Intervention(s): Limited activity within patient's tolerance;Monitored during session;Repositioned  Home Living                                          Prior Functioning/Environment              Frequency  Min 2X/week        Progress Toward Goals  OT Goals(current goals can  now be found in the care plan section)  Progress towards OT goals: Progressing toward goals  Acute Rehab OT Goals Patient Stated Goal: to have less pain OT Goal Formulation: With patient  Plan Discharge plan remains appropriate;Frequency remains appropriate    Co-evaluation                 AM-PAC OT "6 Clicks" Daily Activity     Outcome Measure   Help from another person eating meals?: None Help from another person taking care of personal grooming?: A Little Help from another person toileting, which includes using toliet, bedpan, or urinal?: A Lot Help from another person bathing (including washing, rinsing, drying)?: A Lot Help from another person to put on and taking off regular upper body clothing?: A Little Help from another person to put on and taking off regular lower body clothing?: A Lot 6 Click Score: 16    End of Session Equipment Utilized During Treatment: Gait belt;Rolling walker;Oxygen  OT Visit Diagnosis: Unsteadiness on feet (R26.81);Other abnormalities of gait and mobility (R26.89);Muscle weakness (generalized) (M62.81);Pain Pain - Right/Left: Left Pain - part of body: Leg   Activity Tolerance Patient tolerated treatment well   Patient Left in chair;with call bell/phone within reach   Nurse Communication Mobility status        Time: 8299-3716 OT Time Calculation (min): 28 min  Charges: OT General Charges $OT Visit: 1 Visit OT Treatments $Self Care/Home Management : 23-37 mins  Barry Brunner, OT Acute Rehabilitation Services Pager [phone] Office [phone]    [name] 08/25/2021, 1:01 PM

## 2021-08-26 ENCOUNTER — Inpatient Hospital Stay (HOSPITAL_COMMUNITY): Payer: No Typology Code available for payment source

## 2021-08-26 ENCOUNTER — Inpatient Hospital Stay (HOSPITAL_COMMUNITY): Payer: No Typology Code available for payment source | Admitting: Certified Registered Nurse Anesthetist

## 2021-08-26 DIAGNOSIS — I469 Cardiac arrest, cause unspecified: Secondary | ICD-10-CM

## 2021-08-26 DIAGNOSIS — I249 Acute ischemic heart disease, unspecified: Secondary | ICD-10-CM

## 2021-08-26 LAB — BASIC METABOLIC PANEL
Anion gap: 17 — ABNORMAL HIGH (ref 5–15)
BUN: 33 mg/dL — ABNORMAL HIGH (ref 8–23)
CO2: 20 mmol/L — ABNORMAL LOW (ref 22–32)
Calcium: 8.5 mg/dL — ABNORMAL LOW (ref 8.9–10.3)
Chloride: 95 mmol/L — ABNORMAL LOW (ref 98–111)
Creatinine, Ser: 1.78 mg/dL — ABNORMAL HIGH (ref 0.61–1.24)
GFR, Estimated: 40 mL/min — ABNORMAL LOW (ref >60)
Glucose, Bld: 400 mg/dL — ABNORMAL HIGH (ref 70–99)
Potassium: 4.1 mmol/L (ref 3.5–5.1)
Sodium: 132 mmol/L — ABNORMAL LOW (ref 135–145)

## 2021-08-26 LAB — POCT I-STAT 7, (LYTES, BLD GAS, ICA,H+H)
Acid-base deficit: 2 mmol/L (ref 0.0–2.0)
Bicarbonate: 24.3 mmol/L (ref 20.0–28.0)
Calcium, Ion: 1.11 mmol/L — ABNORMAL LOW (ref 1.15–1.40)
HCT: 39 % (ref 39.0–52.0)
Hemoglobin: 13.3 g/dL (ref 13.0–17.0)
O2 Saturation: 90 %
Patient temperature: 98.5
Potassium: 3.8 mmol/L (ref 3.5–5.1)
Sodium: 135 mmol/L (ref 135–145)
TCO2: 26 mmol/L (ref 22–32)
pCO2 arterial: 45.6 mmHg (ref 32.0–48.0)
pH, Arterial: 7.335 — ABNORMAL LOW (ref 7.350–7.450)
pO2, Arterial: 62 mmHg — ABNORMAL LOW (ref 83.0–108.0)

## 2021-08-26 LAB — CBC WITH DIFFERENTIAL/PLATELET
Abs Immature Granulocytes: 0.46 10*3/uL — ABNORMAL HIGH (ref 0.00–0.07)
Basophils Absolute: 0.1 10*3/uL (ref 0.0–0.1)
Basophils Relative: 1 %
Eosinophils Absolute: 0.1 10*3/uL (ref 0.0–0.5)
Eosinophils Relative: 1 %
HCT: 41.1 % (ref 39.0–52.0)
Hemoglobin: 13.5 g/dL (ref 13.0–17.0)
Immature Granulocytes: 3 %
Lymphocytes Relative: 20 %
Lymphs Abs: 2.9 10*3/uL (ref 0.7–4.0)
MCH: 32.3 pg (ref 26.0–34.0)
MCHC: 32.8 g/dL (ref 30.0–36.0)
MCV: 98.3 fL (ref 80.0–100.0)
Monocytes Absolute: 0.7 10*3/uL (ref 0.1–1.0)
Monocytes Relative: 5 %
Neutro Abs: 10.2 10*3/uL — ABNORMAL HIGH (ref 1.7–7.7)
Neutrophils Relative %: 70 %
Platelets: 299 10*3/uL (ref 150–400)
RBC: 4.18 MIL/uL — ABNORMAL LOW (ref 4.22–5.81)
RDW: 14.4 % (ref 11.5–15.5)
WBC: 14.4 10*3/uL — ABNORMAL HIGH (ref 4.0–10.5)
nRBC: 0 % (ref 0.0–0.2)

## 2021-08-26 LAB — URINALYSIS, ROUTINE W REFLEX MICROSCOPIC
Bilirubin Urine: NEGATIVE
Glucose, UA: 50 mg/dL — AB
Hgb urine dipstick: NEGATIVE
Ketones, ur: NEGATIVE mg/dL
Leukocytes,Ua: NEGATIVE
Nitrite: NEGATIVE
Protein, ur: 30 mg/dL — AB
Specific Gravity, Urine: 1.019 (ref 1.005–1.030)
pH: 5 (ref 5.0–8.0)

## 2021-08-26 LAB — COMPREHENSIVE METABOLIC PANEL
ALT: 138 U/L — ABNORMAL HIGH (ref 0–44)
AST: 240 U/L — ABNORMAL HIGH (ref 15–41)
Albumin: 2.6 g/dL — ABNORMAL LOW (ref 3.5–5.0)
Alkaline Phosphatase: 109 U/L (ref 38–126)
Anion gap: 16 — ABNORMAL HIGH (ref 5–15)
BUN: 25 mg/dL — ABNORMAL HIGH (ref 8–23)
CO2: 26 mmol/L (ref 22–32)
Calcium: 8.7 mg/dL — ABNORMAL LOW (ref 8.9–10.3)
Chloride: 94 mmol/L — ABNORMAL LOW (ref 98–111)
Creatinine, Ser: 1.82 mg/dL — ABNORMAL HIGH (ref 0.61–1.24)
GFR, Estimated: 38 mL/min — ABNORMAL LOW (ref >60)
Glucose, Bld: 277 mg/dL — ABNORMAL HIGH (ref 70–99)
Potassium: 4 mmol/L (ref 3.5–5.1)
Sodium: 136 mmol/L (ref 135–145)
Total Bilirubin: 1 mg/dL (ref 0.3–1.2)
Total Protein: 6 g/dL — ABNORMAL LOW (ref 6.5–8.1)

## 2021-08-26 LAB — LACTIC ACID, PLASMA
Lactic Acid, Venous: 8.1 mmol/L (ref 0.5–1.9)
Lactic Acid, Venous: 7.5 mmol/L (ref 0.5–1.9)

## 2021-08-26 LAB — GLUCOSE, CAPILLARY
Glucose-Capillary: 163 mg/dL — ABNORMAL HIGH (ref 70–99)
Glucose-Capillary: 169 mg/dL — ABNORMAL HIGH (ref 70–99)
Glucose-Capillary: 206 mg/dL — ABNORMAL HIGH (ref 70–99)
Glucose-Capillary: 215 mg/dL — ABNORMAL HIGH (ref 70–99)
Glucose-Capillary: 223 mg/dL — ABNORMAL HIGH (ref 70–99)
Glucose-Capillary: 248 mg/dL — ABNORMAL HIGH (ref 70–99)
Glucose-Capillary: 253 mg/dL — ABNORMAL HIGH (ref 70–99)
Glucose-Capillary: 313 mg/dL — ABNORMAL HIGH (ref 70–99)
Glucose-Capillary: 372 mg/dL — ABNORMAL HIGH (ref 70–99)
Glucose-Capillary: 383 mg/dL — ABNORMAL HIGH (ref 70–99)

## 2021-08-26 LAB — ECHOCARDIOGRAM COMPLETE
AR max vel: 2.01 cm2
AV Area VTI: 1.94 cm2
AV Area mean vel: 2.1 cm2
AV Mean grad: 4 mmHg
AV Peak grad: 9.7 mmHg
Ao pk vel: 1.56 m/s
Area-P 1/2: 4.63 cm2
Calc EF: 9.5 %
Height: 68 in
MV M vel: 4.04 m/s
MV Peak grad: 65.3 mmHg
S' Lateral: 4.3 cm
Single Plane A2C EF: -3.8 %
Single Plane A4C EF: 18.9 %
Weight: 3289.6 oz

## 2021-08-26 LAB — POCT I-STAT EG7
Acid-Base Excess: 1 mmol/L (ref 0.0–2.0)
Bicarbonate: 28.3 mmol/L — ABNORMAL HIGH (ref 20.0–28.0)
Calcium, Ion: 1.11 mmol/L — ABNORMAL LOW (ref 1.15–1.40)
HCT: 39 % (ref 39.0–52.0)
Hemoglobin: 13.3 g/dL (ref 13.0–17.0)
O2 Saturation: 46 %
Patient temperature: 98.5
Potassium: 4.2 mmol/L (ref 3.5–5.1)
Sodium: 135 mmol/L (ref 135–145)
TCO2: 30 mmol/L (ref 22–32)
pCO2, Ven: 57.9 mmHg (ref 44.0–60.0)
pH, Ven: 7.297 (ref 7.250–7.430)
pO2, Ven: 29 mmHg — CL (ref 32.0–45.0)

## 2021-08-26 LAB — CBC
HCT: 40.9 % (ref 39.0–52.0)
Hemoglobin: 13.6 g/dL (ref 13.0–17.0)
MCH: 31.8 pg (ref 26.0–34.0)
MCHC: 33.3 g/dL (ref 30.0–36.0)
MCV: 95.6 fL (ref 80.0–100.0)
Platelets: 335 10*3/uL (ref 150–400)
RBC: 4.28 MIL/uL (ref 4.22–5.81)
RDW: 14.1 % (ref 11.5–15.5)
WBC: 19.9 10*3/uL — ABNORMAL HIGH (ref 4.0–10.5)
nRBC: 0 % (ref 0.0–0.2)

## 2021-08-26 LAB — TROPONIN I (HIGH SENSITIVITY)
Troponin I (High Sensitivity): 370 ng/L (ref <18)
Troponin I (High Sensitivity): 796 ng/L (ref <18)

## 2021-08-26 LAB — MAGNESIUM: Magnesium: 2.5 mg/dL — ABNORMAL HIGH (ref 1.7–2.4)

## 2021-08-26 MED ORDER — INSULIN ASPART PROT & ASPART (70-30 MIX) 100 UNIT/ML ~~LOC~~ SUSP
15.0000 [IU] | Freq: Two times a day (BID) | SUBCUTANEOUS | Status: DC
  Filled 2021-08-26: qty 10

## 2021-08-26 MED ORDER — DOCUSATE SODIUM 50 MG/5ML PO LIQD
100.0000 mg | Freq: Two times a day (BID) | ORAL | Status: DC
  Administered 2021-08-26 – 2021-08-29 (×7): 100 mg
  Filled 2021-08-26 (×7): qty 10

## 2021-08-26 MED ORDER — INSULIN REGULAR(HUMAN) IN NACL 100-0.9 UT/100ML-% IV SOLN
INTRAVENOUS | Status: AC
  Administered 2021-08-26: 9.5 [IU]/h via INTRAVENOUS
  Administered 2021-08-27: 3.8 [IU]/h via INTRAVENOUS
  Filled 2021-08-26: qty 100

## 2021-08-26 MED ORDER — AMIODARONE HCL IN DEXTROSE 360-4.14 MG/200ML-% IV SOLN
30.0000 mg/h | INTRAVENOUS | Status: DC
  Administered 2021-08-27 – 2021-08-29 (×4): 30 mg/h via INTRAVENOUS
  Filled 2021-08-26 (×5): qty 200

## 2021-08-26 MED ORDER — ACETAMINOPHEN 325 MG PO TABS
650.0000 mg | ORAL_TABLET | ORAL | Status: DC
  Administered 2021-08-26 – 2021-08-27 (×2): 650 mg via ORAL
  Filled 2021-08-26 (×2): qty 2

## 2021-08-26 MED ORDER — DOCUSATE SODIUM 50 MG/5ML PO LIQD: 100.0000 mg | Freq: Every day | ORAL | Status: DC

## 2021-08-26 MED ORDER — FENTANYL CITRATE (PF) 100 MCG/2ML IJ SOLN: 25.0000 ug | INTRAMUSCULAR | Status: DC | PRN

## 2021-08-26 MED ORDER — ACETAMINOPHEN 160 MG/5ML PO SOLN
650.0000 mg | ORAL | Status: DC
  Administered 2021-08-26 – 2021-08-27 (×4): 650 mg
  Filled 2021-08-26 (×4): qty 20.3

## 2021-08-26 MED ORDER — FENTANYL BOLUS VIA INFUSION
50.0000 ug | INTRAVENOUS | Status: DC | PRN
  Administered 2021-08-27: 50 ug via INTRAVENOUS
  Filled 2021-08-26: qty 100

## 2021-08-26 MED ORDER — PHENTOLAMINE MESYLATE 5 MG IJ SOLR
5.0000 mg | Freq: Once | INTRAMUSCULAR | Status: AC
  Administered 2021-08-26: 5 mg via SUBCUTANEOUS
  Filled 2021-08-26: qty 5

## 2021-08-26 MED ORDER — EPINEPHRINE 1 MG/10ML IJ SOSY
PREFILLED_SYRINGE | INTRAMUSCULAR | Status: AC
  Filled 2021-08-26: qty 20

## 2021-08-26 MED ORDER — MIDAZOLAM HCL 2 MG/2ML IJ SOLN: 1.0000 mg | INTRAMUSCULAR | Status: DC | PRN

## 2021-08-26 MED ORDER — PANTOPRAZOLE SODIUM 40 MG PO PACK
40.0000 mg | PACK | Freq: Every day | ORAL | Status: DC
  Administered 2021-08-27 – 2021-08-29 (×3): 40 mg
  Filled 2021-08-26 (×3): qty 20

## 2021-08-26 MED ORDER — CLOPIDOGREL BISULFATE 75 MG PO TABS
75.0000 mg | ORAL_TABLET | Freq: Every morning | ORAL | Status: DC
  Administered 2021-08-27 – 2021-08-29 (×3): 75 mg
  Filled 2021-08-26 (×4): qty 1

## 2021-08-26 MED ORDER — NITROGLYCERIN 2 % TD OINT
1.0000 [in_us] | TOPICAL_OINTMENT | Freq: Three times a day (TID) | TRANSDERMAL | Status: DC
Start: 2021-08-26 — End: 2021-08-26
  Filled 2021-08-26: qty 30

## 2021-08-26 MED ORDER — FENTANYL CITRATE (PF) 100 MCG/2ML IJ SOLN
50.0000 ug | Freq: Once | INTRAMUSCULAR | Status: AC
  Administered 2021-08-26: 50 ug via INTRAVENOUS
  Filled 2021-08-26: qty 2

## 2021-08-26 MED ORDER — ASPIRIN 325 MG PO TABS
325.0000 mg | ORAL_TABLET | Freq: Every day | ORAL | Status: DC
  Administered 2021-08-26 – 2021-08-29 (×4): 325 mg
  Filled 2021-08-26 (×4): qty 1

## 2021-08-26 MED ORDER — INSULIN ASPART 100 UNIT/ML IJ SOLN
0.0000 [IU] | INTRAMUSCULAR | Status: DC
  Administered 2021-08-26 (×2): 15 [IU] via SUBCUTANEOUS

## 2021-08-26 MED ORDER — BUSPIRONE HCL 10 MG PO TABS
30.0000 mg | ORAL_TABLET | Freq: Three times a day (TID) | ORAL | Status: DC
  Filled 2021-08-26 (×2): qty 3

## 2021-08-26 MED ORDER — BUSPIRONE HCL 10 MG PO TABS
30.0000 mg | ORAL_TABLET | Freq: Three times a day (TID) | ORAL | Status: DC
  Administered 2021-08-26 – 2021-08-27 (×3): 30 mg
  Filled 2021-08-26 (×3): qty 3

## 2021-08-26 MED ORDER — CHLORHEXIDINE GLUCONATE CLOTH 2 % EX PADS
6.0000 | MEDICATED_PAD | Freq: Every day | CUTANEOUS | Status: DC
  Administered 2021-08-26 – 2021-08-29 (×4): 6 via TOPICAL

## 2021-08-26 MED ORDER — NOREPINEPHRINE 4 MG/250ML-% IV SOLN
0.0000 ug/min | INTRAVENOUS | Status: DC
  Administered 2021-08-26: 8 ug/min via INTRAVENOUS
  Administered 2021-08-26: 38 ug/min via INTRAVENOUS
  Filled 2021-08-26 (×2): qty 250

## 2021-08-26 MED ORDER — CHLORHEXIDINE GLUCONATE 0.12% ORAL RINSE (MEDLINE KIT): 15.0000 mL | Freq: Two times a day (BID) | OROMUCOSAL | Status: DC

## 2021-08-26 MED ORDER — PROPOFOL 1000 MG/100ML IV EMUL
0.0000 ug/kg/min | INTRAVENOUS | Status: DC
Start: 2021-08-26 — End: 2021-08-28

## 2021-08-26 MED ORDER — ACETAMINOPHEN 650 MG RE SUPP: 650.0000 mg | RECTAL | Status: DC

## 2021-08-26 MED ORDER — DEXTROSE 50 % IV SOLN: 0.0000 mL | INTRAVENOUS | Status: DC | PRN

## 2021-08-26 MED ORDER — MAGNESIUM SULFATE 2 GM/50ML IV SOLN
2.0000 g | Freq: Once | INTRAVENOUS | Status: DC
  Administered 2021-08-26: 2 g via INTRAVENOUS
  Filled 2021-08-26: qty 50

## 2021-08-26 MED ORDER — AMIODARONE HCL IN DEXTROSE 360-4.14 MG/200ML-% IV SOLN
60.0000 mg/h | INTRAVENOUS | Status: AC
  Filled 2021-08-26: qty 400

## 2021-08-26 MED ORDER — ATORVASTATIN CALCIUM 40 MG PO TABS: 40.0000 mg | ORAL_TABLET | Freq: Every morning | ORAL | Status: DC

## 2021-08-26 MED ORDER — AMIODARONE HCL IN DEXTROSE 360-4.14 MG/200ML-% IV SOLN
INTRAVENOUS | Status: AC
  Administered 2021-08-26: 60 mg/h via INTRAVENOUS
  Filled 2021-08-26: qty 200

## 2021-08-26 MED ORDER — NITROGLYCERIN 2 % TD OINT
1.0000 [in_us] | TOPICAL_OINTMENT | Freq: Three times a day (TID) | TRANSDERMAL | Status: DC
Start: 2021-08-26 — End: 2021-08-27
  Administered 2021-08-26 – 2021-08-27 (×3): 1 [in_us] via TOPICAL
  Filled 2021-08-26: qty 30

## 2021-08-26 MED ORDER — SODIUM CHLORIDE 0.9 % IV SOLN
2.0000 g | INTRAVENOUS | Status: DC
Start: 2021-08-26 — End: 2021-08-30
  Administered 2021-08-26 – 2021-08-29 (×4): 2 g via INTRAVENOUS
  Filled 2021-08-26 (×4): qty 20

## 2021-08-26 MED ORDER — ORAL CARE MOUTH RINSE
15.0000 mL | OROMUCOSAL | Status: DC
  Administered 2021-08-26: 15 mL via OROMUCOSAL

## 2021-08-26 MED ORDER — FUROSEMIDE 40 MG PO TABS: 40.0000 mg | ORAL_TABLET | Freq: Every morning | ORAL | Status: DC

## 2021-08-26 MED ORDER — POLYETHYLENE GLYCOL 3350 17 G PO PACK
17.0000 g | PACK | Freq: Every day | ORAL | Status: DC
  Administered 2021-08-26 – 2021-08-29 (×4): 17 g
  Filled 2021-08-26 (×4): qty 1

## 2021-08-26 MED ORDER — POLYETHYLENE GLYCOL 3350 17 G PO PACK: 17.0000 g | PACK | Freq: Every day | ORAL | Status: DC

## 2021-08-26 MED ORDER — MAGNESIUM SULFATE 2 GM/50ML IV SOLN: 2.0000 g | Freq: Once | INTRAVENOUS | Status: DC

## 2021-08-26 MED ORDER — ORAL CARE MOUTH RINSE
15.0000 mL | OROMUCOSAL | Status: DC
  Administered 2021-08-26 – 2021-08-29 (×29): 15 mL via OROMUCOSAL

## 2021-08-26 MED ORDER — NOREPINEPHRINE 4 MG/250ML-% IV SOLN
INTRAVENOUS | Status: AC
  Filled 2021-08-26: qty 250

## 2021-08-26 MED ORDER — FENTANYL 2500MCG IN NS 250ML (10MCG/ML) PREMIX INFUSION
50.0000 ug/h | INTRAVENOUS | Status: DC
  Administered 2021-08-26: 50 ug/h via INTRAVENOUS
  Administered 2021-08-27: 75 ug/h via INTRAVENOUS
  Filled 2021-08-26 (×2): qty 250

## 2021-08-26 MED ORDER — SODIUM CHLORIDE 0.9 % IV SOLN
250.0000 mL | INTRAVENOUS | Status: DC
  Administered 2021-08-26: 250 mL via INTRAVENOUS

## 2021-08-26 MED ORDER — CHLORHEXIDINE GLUCONATE 0.12% ORAL RINSE (MEDLINE KIT)
15.0000 mL | Freq: Two times a day (BID) | OROMUCOSAL | Status: DC
  Administered 2021-08-26 – 2021-08-29 (×6): 15 mL via OROMUCOSAL

## 2021-08-26 MED ORDER — SODIUM BICARBONATE 8.4 % IV SOLN
INTRAVENOUS | Status: AC
  Filled 2021-08-26: qty 100

## 2021-08-26 NOTE — Progress Notes (Addendum)
  Progress Note    08/26/2021 7:19 AM 4 Days Post-Op  Subjective:  no complaints  afebrile  Vitals:   08/26/21 0433 08/26/21 0715  BP: 132/65 130/71  Pulse: (!) 106 100  Resp: 19 20  Temp: 98.2 F (36.8 C) 98 F (36.7 C)  SpO2: 100% 96%    Physical Exam: Wound vac with good seal left foot.  Brisk left AT doppler signal.  CBC    Component Value Date/Time   WBC 9.8 08/25/2021 0812   RBC 4.08 (L) 08/25/2021 0812   HGB 12.9 (L) 08/25/2021 0812   HCT 39.6 08/25/2021 0812   PLT 218 08/25/2021 0812   MCV 97.1 08/25/2021 0812   MCH 31.6 08/25/2021 0812   MCHC 32.6 08/25/2021 0812   RDW 14.3 08/25/2021 0812   LYMPHSABS 1.3 07/14/2021 0034   MONOABS 1.1 (H) 07/14/2021 0034   EOSABS 0.1 07/14/2021 0034   BASOSABS 0.0 07/14/2021 0034    BMET    Component Value Date/Time   NA 137 08/25/2021 0812   K 4.4 08/25/2021 0812   CL 98 08/25/2021 0812   CO2 29 08/25/2021 0812   GLUCOSE 122 (H) 08/25/2021 0812   BUN 19 08/25/2021 0812   CREATININE 1.28 (H) 08/25/2021 0812   CALCIUM 9.1 08/25/2021 0812   GFRNONAA 59 (L) 08/25/2021 0812   GFRAA 88 08/30/2008 1249    INR    Component Value Date/Time   INR 1.4 09/22/2007 1200     Intake/Output Summary (Last 24 hours) at 08/26/2021 0719 Last data filed at 08/26/2021 0716 Gross per 24 hour  Intake 0 ml  Output 1350 ml  Net -1350 ml     Assessment/Plan:  75 y.o. male is s/p:   revision left TMA  4 Days Post-Op   -pt vac in place with good seal (vac was changed yesterday). -pt has excellent AT doppler signal (left) -DVT prophylaxis:  sq heparin -pt's labs from yesterday reveal improvement in creatinine from 1.4 to 1.28.  his hgb also stable.   Hyperkalemia improved to 4.4.   Doreatha Massed, PA-C [facility] [phone] 08/26/2021 7:19 AM  I have seen and evaluated the patient. I agree with the PA note as documented above.  Postop day 4 status post revision of nonhealing left TMA.  VAC was  changed yesterday and the wound looks very good.  Hope for discharge today if VAC can be arranged.  Will need 3 times weekly VAC changes.  Follow-up in 2 to 3 weeks for wound check  Cephus Shelling, MD [facility]: [phone]

## 2021-08-26 NOTE — Progress Notes (Signed)
EEG done at bedside. Results pending.  

## 2021-08-26 NOTE — Progress Notes (Addendum)
Met with family briefly.  Provided them how post arrest TTM and prognosticating usually works.  Dejohn and his wife have apparently spoken about this before and he has stated he would not want chest compressions/shocks life support if things were looking bad.  We all agreed to continue current level of aggressive care but if his heart stops again we will allow him to go naturally.  DNR entered, discussed with RN Minette Brine.  21 minutes spent with family going over [place].  Erskine Emery MD PCCM

## 2021-08-26 NOTE — Consult Note (Addendum)
NAME:  [name], MRN:  [id number], DOB:  [birth year], LOS: 4 ADMISSION DATE:  09/19/2021, CONSULTATION DATE:  08/26/2021 REFERRING MD:  Dr. Chestine Sporelark, CHIEF COMPLAINT:  PEA Arrest.    History of Present Illness:  75 year old male presents 9/2 for nonhealing transmetatarsal amputation wound for revision with wound vac placement. 9/6 noted to be bradycardiac and unresponsive, given 1 EPI, and then proceed to PEA arrest, 20 minutes prior to ROSC, given EPI, Mag, Bicarb, and Amiodarone, 3 Shocks (appears to be SVT).   WBC 9.8. Hemoglobin 12.9, K 4.4, Crt 1.28   On arrival to ICU levophed is infusing in right arm which was noted to be infiltrated, patient is intubated and unresponsive. HR 100, BP 109/17. Oxygen Saturation 95%.   Pertinent  Medical History  CAD, DM, Depression, HLD, OSA, PVD  Significant Hospital Events: Including procedures, antibiotic start and stop dates in addition to other pertinent events   9/2 OR for revision  9/6 PEA arrest   Interim History / Subjective:  As above.   Objective   Blood pressure 130/71, pulse 100, temperature 98 F (36.7 C), temperature source Oral, resp. rate 20, height 5\' 8"  (1.727 m), weight 93.3 kg, SpO2 96 %.        Intake/Output Summary (Last 24 hours) at 08/26/2021 0900 Last data filed at 08/26/2021 0716 Gross per 24 hour  Intake 0 ml  Output 1350 ml  Net -1350 ml   Filed Weights   08/21/21 1211 09/07/2021 0655  Weight: 92.4 kg 93.3 kg    Examination: General: elderly male on vent  HENT: ETT in place  Lungs: clear breath sounds, no use of accessory muscles  Cardiovascular: Tachy, regular, no mRG Abdomen: obese, distended, active bowel sounds  Extremities: wound vac in place to left foot >> clean, intact, right forearm with discoloration - warm, pulses intact  Neuro: unresponsive, does not follow commands, pupils intact and reactive  GU: foley in place   Resolved Hospital Problem list     Assessment & Plan:   PEA Cardiac  Arrest, unclear etiology, question vaso-vagal event Plan -Maintain MAP >65  -ECHO pending -TTM Protocol ordered  -PAN Culture   Respiratory Insuffiencey in setting of above Plan -Continue Vent Support -Obtain ABG once A.Line is placed  -VAP Prevention Bundle  -CXR pending   Extravasation of Vasopressors to right forearm Plan -Protocol in place with phentolamine -Monitor extremity   CAD, PVD, HLD Plan -Continue ASA, Plavix -Hold home Lasix   -Hold home lasix   H/O HTN Plan -Hold home metoprolol in setting of hypotension   DM Plan -Trend glucose  -SSI   Left TMA s/p Revision  Plan -Per Vascular    Best Practice (right click and "Reselect all SmartList Selections" daily)   Diet/type: NPO DVT prophylaxis: prophylactic heparin  GI prophylaxis: PPI Lines: Central line Foley:  Yes, and it is still needed Code Status:  full code Last date of multidisciplinary goals of care discussion [pending]  Labs   CBC: Recent Labs  Lab 08/30/2021 0738 09/13/2021 1741 08/23/21 0040 08/24/21 0054 08/25/21 0812  WBC  --  6.7 10.7* 12.8* 9.8  HGB 16.3 12.6* 13.2 13.1 12.9*  HCT 48.0 38.5* 40.7 40.8 39.6  MCV  --  96.5 97.4 96.7 97.1  PLT  --  194 216 222 218    Basic Metabolic Panel: Recent Labs  Lab 09/05/2021 0738 08/26/2021 1741 08/23/21 0040 08/24/21 0054 08/25/21 0812  NA 140  --  138 134* 137  K 4.7  --  4.0 6.0* 4.4  CL 101  --  101 98 98  CO2  --   --  31 21* 29  GLUCOSE 100*  --  61* 185* 122*  BUN 14  --  10 12 19   CREATININE 1.00 1.10 1.12 1.41* 1.28*  CALCIUM  --   --  8.9 9.1 9.1   GFR: Estimated Creatinine Clearance: 56.1 mL/min (A) (by C-G formula based on SCr of 1.28 mg/dL (H)). Recent Labs  Lab 09-18-2021 1741 08/23/21 0040 08/24/21 0054 08/25/21 0812  WBC 6.7 10.7* 12.8* 9.8    Liver Function Tests: No results for input(s): AST, ALT, ALKPHOS, BILITOT, PROT, ALBUMIN in the last 168 hours. No results for input(s): LIPASE, AMYLASE in the last  168 hours. No results for input(s): AMMONIA in the last 168 hours.  ABG    Component Value Date/Time   PHART 7.332 (L) 09/22/2007 1836   PCO2ART 44.3 09/22/2007 1836   PO2ART 93.0 09/22/2007 1738   HCO3 23.5 09/22/2007 1836   TCO2 25 2021/09/18 0738   ACIDBASEDEF 2.0 09/22/2007 1836   O2SAT 97.0 09/22/2007 1738     Coagulation Profile: No results for input(s): INR, PROTIME in the last 168 hours.  Cardiac Enzymes: No results for input(s): CKTOTAL, CKMB, CKMBINDEX, TROPONINI in the last 168 hours.  HbA1C: Hgb A1c MFr Bld  Date/Time Value Ref Range Status  07/11/2021 05:47 PM 7.2 (H) 4.8 - 5.6 % Final    Comment:    (NOTE)         Prediabetes: 5.7 - 6.4         Diabetes: >6.4         Glycemic control for adults with diabetes: <7.0   09/17/2007 03:00 AM (H)  Final   7.6 (NOTE)   The ADA recommends the following therapeutic goals for glycemic   control related to Hgb A1C measurement:   Goal of Therapy:   < 7.0% Hgb A1C   Action Suggested:  > 8.0% Hgb A1C   Ref:  Diabetes Care, 22, Suppl. 1, 1999    CBG: Recent Labs  Lab 08/25/21 0552 08/25/21 1241 08/25/21 1659 08/25/21 2100 08/26/21 0552  GLUCAP 180* 52* 98 140* 169*    Review of Systems:   Unable to review as patient is intubated and unresponsive   Past Medical History:  He,  has a past medical history of CAD, AUTOLOGOUS BYPASS GRAFT, Depression, DIABETES, TYPE 2, DYSPNEA, HYPERLIPIDEMIA, OBESITY, OBSTRUCTIVE SLEEP APNEA, Old myocardial infarction (2005), Peripheral vascular disease (HCC), PTSD (post-traumatic stress disorder), and Stroke (HCC) (09/2007).   Surgical History:   Past Surgical History:  Procedure Laterality Date   ABDOMINAL AORTOGRAM W/LOWER EXTREMITY Bilateral 03/10/2021   Procedure: ABDOMINAL AORTOGRAM W/LOWER EXTREMITY;  Surgeon: 03/12/2021, MD;  Location: [facility] INVASIVE CV LAB;  Service: Cardiovascular;  Laterality: Bilateral;   ABDOMINAL AORTOGRAM W/LOWER EXTREMITY N/A 07/14/2021    Procedure: ABDOMINAL AORTOGRAM W/LOWER EXTREMITY;  Surgeon: 07/16/2021, MD;  Location: [facility] INVASIVE CV LAB;  Service: Cardiovascular;  Laterality: N/A;   AMPUTATION Left 03/18/2021   Procedure: LEFT FOURTH TOE AMPUTATION;  Surgeon: 03/20/2021, MD;  Location: [facility] OR;  Service: Vascular;  Laterality: Left;   APPENDECTOMY  1965   APPLICATION OF WOUND VAC Left 18-Sep-2021   Procedure: APPLICATION OF WOUND VAC;  Surgeon: 10/22/2021, MD;  Location: [facility] OR;  Service: Vascular;  Laterality: Left;   CORONARY ARTERY BYPASS GRAFT  2008   at [place]  cyst remove on lelt leg,posterior     LOWER EXTREMITY ANGIOGRAPHY N/A 03/07/2018   Procedure: LOWER EXTREMITY ANGIOGRAPHY;  Surgeon: Maeola Harman, MD;  Location: [facility] INVASIVE CV LAB;  Service: Cardiovascular;  Laterality: N/A;   LOWER EXTREMITY ANGIOGRAPHY Bilateral 05/29/2019   Procedure: LOWER EXTREMITY ANGIOGRAPHY;  Surgeon: Maeola Harman, MD;  Location: [facility] INVASIVE CV LAB;  Service: Cardiovascular;  Laterality: Bilateral;   PERIPHERAL VASCULAR ATHERECTOMY Right 03/07/2018   Procedure: PERIPHERAL VASCULAR ATHERECTOMY;  Surgeon: Maeola Harman, MD;  Location: [facility] INVASIVE CV LAB;  Service: Cardiovascular;  Laterality: Right;  anterioer tibial   TRANSMETATARSAL AMPUTATION Left 07/16/2021   Procedure: LEFT TRANSMETATARSAL AMPUTATION;  Surgeon: Maeola Harman, MD;  [facility];  Service: Vascular;  Laterality: Left;   TRANSMETATARSAL AMPUTATION Left September 03, 2021   Procedure: LEFT TRANSMETATARSAL AMPUTATION REVISION;  Surgeon: Cephus Shelling, MD;  Location: [facility] OR;  Service: Vascular;  Laterality: Left;   VASECTOMY     WOUND DEBRIDEMENT Left 05/02/2021   Procedure: Left 4th toe amputation site debridement including metatarsal head;  Surgeon: Cephus Shelling, MD;  Location: [facility] OR;  Service: Vascular;  Laterality: Left;     Social History:   reports that he has quit  smoking. His smoking use included pipe. He has never used smokeless tobacco. He reports that he does not drink alcohol and does not use drugs.   Family History:  His family history includes Alcoholism in his father; Diabetes in his mother.   Allergies Allergies  Allergen Reactions   Ace Inhibitors     Heart attack    Lisinopril     Heart attack     Home Medications  Prior to Admission medications   Medication Sig Start Date End Date Taking? Authorizing Provider  acetaminophen (TYLENOL) 500 MG tablet Take 1 tablet (500 mg total) by mouth every 4 (four) hours as needed for moderate pain (pain). Patient taking differently: Take 1,500 mg by mouth 2 (two) times daily as needed for moderate pain (pain). 07/17/21  Yes Ellison Carwin, MD  aspirin EC 325 MG tablet Take 325 mg by mouth in the morning.   Yes [provider]  atorvastatin (LIPITOR) 40 MG tablet Take 40 mg by mouth in the morning.   Yes [provider]  Cholecalciferol 2000 units TABS Take 2,000 Units by mouth in the morning.   Yes [provider]  clopidogrel (PLAVIX) 75 MG tablet Take 75 mg by mouth in the morning.   Yes [provider]  Coenzyme Q10 (COQ-10) 200 MG CAPS Take 200 mg by mouth in the morning.   Yes [provider]  DULoxetine (CYMBALTA) 30 MG capsule Take 30 mg by mouth daily.   Yes [provider]  furosemide (LASIX) 40 MG tablet Take 40 mg by mouth in the morning.   Yes [provider]  insulin aspart protamine- aspart (NOVOLOG MIX 70/30) (70-30) 100 UNIT/ML injection Inject 30 Units into the skin in the morning and at bedtime.   Yes [provider]  metFORMIN (GLUCOPHAGE) 1000 MG tablet Take 1,000 mg by mouth in the morning and at bedtime.   Yes [provider]  metoprolol tartrate (LOPRESSOR) 50 MG tablet Take 50 mg by mouth 2 (two) times daily.   Yes [provider]  Misc Natural Products (NEURIVA) CAPS Take 1 capsule by  mouth at bedtime.   Yes [provider]  Multiple Vitamin (MULTIVITAMIN WITH MINERALS) TABS tablet Take 1 tablet by mouth daily. Men's  50+ Patient taking differently: Take 1 tablet by mouth 2 (two) times daily. Men's 50+ 07/17/21  Yes Ellison Carwin, MD  OVER THE COUNTER MEDICATION Take 1 tablet by mouth 3 (three) times daily with meals. Sugar Blockers   Yes [provider]  OVER THE COUNTER MEDICATION Take 2 tablets by mouth at bedtime. olly sleep   Yes [provider]  Semaglutide, 1 MG/DOSE, (OZEMPIC, 1 MG/DOSE,) 4 MG/3ML SOPN Inject 1 mg into the skin every Monday.   Yes [provider]  traZODone (DESYREL) 50 MG tablet Take 25 mg by mouth at bedtime.    Yes [provider]  APPLE CIDER VINEGAR PO Take 1 capsule by mouth daily.    [provider]  Charcoal Activated (ACTIVATED CHARCOAL PO) Take 1,040 mg by mouth daily.    [provider]  Chilton Si Tea, Camellia sinensis, (GREEN TEA EXTRACT PO) Take 400 mg by mouth 2 (two) times a day.    [provider]  nitroGLYCERIN (NITROSTAT) 0.4 MG SL tablet Place 0.4 mg under the tongue every 5 (five) minutes x 3 doses as needed for chest pain.    [provider]  RESVERATROL PO Take 500 mg by mouth daily.    [provider]    CRITICAL CARE Performed by: Tobey Grim   Total critical care time: 42 minutes  Critical care time was exclusive of separately billable procedures and treating other patients.  Critical care was necessary to treat or prevent imminent or life-threatening deterioration.  Critical care was time spent personally by me on the following activities: development of treatment plan with patient and/or surrogate as well as nursing, discussions with consultants, evaluation of patient's response to treatment, examination of patient, obtaining history from patient or surrogate, ordering and performing treatments and interventions, ordering and review  of laboratory studies, ordering and review of radiographic studies, pulse oximetry and re-evaluation of patient's condition.  Jovita Kussmaul, AGACNP-BC [facility]  PCCM Pgr: [phone]

## 2021-08-26 NOTE — Progress Notes (Signed)
VLTM started at beside. Neuro informed. Marker button checked. [facility] notified. Not MRI compatible leads used.

## 2021-08-26 NOTE — Progress Notes (Signed)
Date and time results received: 08/26/21 1103  Test: Lactic Acid 8.1/ Troponin 370  Name of Provider Notified: Myrla Halsted MD  Follow up labs orders placed

## 2021-08-26 NOTE — Progress Notes (Signed)
*  PRELIMINARY RESULTS* Echocardiogram 2D Echocardiogram has been performed.  Neomia Dear RDCS 08/26/2021, 10:37 AM

## 2021-08-26 NOTE — Progress Notes (Signed)
Per RN STAT CT ordered so hold off on EEG - she will call when patient is available.

## 2021-08-26 NOTE — Procedures (Signed)
Arterial Catheter Insertion Procedure Note  [name]  [id number]  May 09, 1946  Date:08/26/21  Time:9:53 AM    Provider Performing: Lorin Glass    Procedure: Insertion of Arterial Line (00370) with US guidance (48889)   Indication(s) Blood pressure monitoring and/or need for frequent ABGs  Consent Unable to obtain consent due to emergent nature of procedure.  Anesthesia None   Time Out Verified patient identification, verified procedure, site/side was marked, verified correct patient position, special equipment/implants available, medications/allergies/relevant history reviewed, required imaging and test results available.   Sterile Technique Maximal sterile technique including full sterile barrier drape, hand hygiene, sterile gown, sterile gloves, mask, hair covering, sterile ultrasound probe cover (if used).   Procedure Description Area of catheter insertion was cleaned with chlorhexidine and draped in sterile fashion. With real-time ultrasound guidance an arterial catheter was placed into the right femoral artery.  Appropriate arterial tracings confirmed on monitor.     Complications/Tolerance None; patient tolerated the procedure well.   EBL Minimal   Specimen(s) None

## 2021-08-26 NOTE — Transfer of Care (Signed)
Immediate Anesthesia Transfer of Care Note  Patient: [name]  Procedure(s) Performed: AN AD HOC INTUBATION  Patient Location: CPR in progress  Anesthesia Type:intubation  Level of Consciousness: unresponsive  Airway & Oxygen Therapy: Patient remains intubated per anesthesia plan and Patient placed on Ventilator (see vital sign flow sheet for setting)  Post-op Assessment: Report given to RN  Post vital signs: Reviewed and unstable  Last Vitals:  Vitals Value Taken Time  BP 123/82 08/26/21 0848  Temp    Pulse 123 08/26/21 0849  Resp 22 08/26/21 0849  SpO2 100 % 08/26/21 0849  Vitals shown include unvalidated device data.  Last Pain:  Vitals:   08/26/21 0715  TempSrc: Oral  PainSc:          Complications: No notable events documented.

## 2021-08-26 NOTE — Progress Notes (Signed)
    Patient seen and evaluated and to heart and vascular ICU status post PEA arrest.  He is unresponsive at this time and intubated.  Noted plans for CT of the head.  I have updated the family in the waiting area.  Lemar Livings, MD

## 2021-08-26 NOTE — Progress Notes (Signed)
   08/26/21 5681  Clinical Encounter Type  Visited With Patient not available  Visit Type Code  Referral From Nurse  Consult/Referral To Chaplain   Chaplain responded. The patient is being attended to by the medical team. There is no support person here. Chaplain remains available for follow-up spiritual/emotional support as needed. This note was prepared by Deneen Harts, M.Div..  For questions please contact by phone [phone].

## 2021-08-26 NOTE — Progress Notes (Signed)
At shift change this morning pt was awake and alert. At 8:15am, CCMD notified me that pt HR was in the 160's and looked like he had some afibb going on. At 8:19am, CCMD called again to notify that the HR dropped into the 40's. When I entered the patient was sternal rubbed, no response and no pulse was felt, Code blue started and lasted approx 30 min. PT was transferred to 2H17.  Respiratory notified, MD notified, charge nurse and rapid response nurse were present.   Kalman Jewels, RN 08/26/2021

## 2021-08-26 NOTE — Plan of Care (Signed)
  Problem: Clinical Measurements: Goal: Will remain free from infection Outcome: Progressing Goal: Respiratory complications will improve Outcome: Progressing Goal: Cardiovascular complication will be avoided Outcome: Progressing   

## 2021-08-26 NOTE — Progress Notes (Signed)
      [address]       [place] [address]             [phone]      I was in hallway when code called  CPR in progress, monitor showed SB in 40s with PEA, unresponsive  1 amp of epi given with HR up into 130s, but still no pulse  Code team arrived and I turned over management to them  [place] C. Dorris Fetch, MD [facility] [phone]

## 2021-08-26 NOTE — Procedures (Signed)
Central Venous Catheter Insertion Procedure Note  MELVERN RAMONE  [id number]  01/15/1946  Date:08/26/21  Time:9:52 AM   Provider Performing:Ozzy Bohlken Salena Saner Katrinka Blazing   Procedure: Insertion of Non-tunneled Central Venous Catheter(36556) with US guidance (33545)   Indication(s) Difficult access  Consent Unable to obtain consent due to emergent nature of procedure.  Anesthesia Topical only with 1% lidocaine   Timeout Verified patient identification, verified procedure, site/side was marked, verified correct patient position, special equipment/implants available, medications/allergies/relevant history reviewed, required imaging and test results available.  Sterile Technique Maximal sterile technique including full sterile barrier drape, hand hygiene, sterile gown, sterile gloves, mask, hair covering, sterile ultrasound probe cover (if used).  Procedure Description Area of catheter insertion was cleaned with chlorhexidine and draped in sterile fashion.  With real-time ultrasound guidance a central venous catheter was placed into the right femoral vein. Nonpulsatile blood flow and easy flushing noted in all ports.  The catheter was sutured in place and sterile dressing applied.  Complications/Tolerance None; patient tolerated the procedure well. Chest X-ray is ordered to verify placement for internal jugular or subclavian cannulation.   Chest x-ray is not ordered for femoral cannulation.  EBL Minimal  Specimen(s) None

## 2021-08-26 NOTE — Progress Notes (Signed)
Transported pt to CT scan and back without complications.  

## 2021-08-26 NOTE — Code Documentation (Signed)
  Patient Name: [name]   MRN: [id number]   Date of Birth/ Sex: [birth year] , male      Admission Date: 08/30/2021  Attending Provider: Cephus Shelling, MD  Primary Diagnosis: <principal problem not specified>    Indication: Pt was in his usual state of health until this AM, when he was noted to be PEA. Code blue was subsequently called. At the time of arrival on scene, ACLS protocol was underway.   Technical Description:  - CPR performance duration:  ~20  minutes  - Was defibrillation or cardioversion used? Yes   - Was external pacer placed? Yes  - Was patient intubated pre/post CPR? Yes   Medications Administered: Y = Yes; Blank = No Amiodarone  Y  Atropine    Calcium    Epinephrine  Y  Lidocaine    Magnesium  Y  Norepinephrine  Y  Phenylephrine    Sodium bicarbonate  Y  Vasopressin     Post CPR evaluation:  - Final Status - Was patient successfully resuscitated ? Yes - What is current rhythm? NSR - What is current hemodynamic status? Intubated and on pressor support   Miscellaneous Information:  - Labs sent, including: CMP, CBC, Lactate, Trop  - Primary team notified?  Yes  - Family Notified? Yes  - Additional notes/ transfer status: Transferred to [facility]      Eliezer Bottom, MD  08/26/2021, 9:03 AM

## 2021-08-26 NOTE — Progress Notes (Signed)
RT obtained respiratory culture and sent to main lab at this time.

## 2021-08-26 NOTE — Anesthesia Postprocedure Evaluation (Signed)
Anesthesia Post Note  Patient: [name]  Procedure(s) Performed: AN AD HOC INTUBATION     Patient location during evaluation: Nursing Unit Anesthetic complications: no   No notable events documented.  Last Vitals:  Vitals:   08/26/21 0433 08/26/21 0715  BP: 132/65 130/71  Pulse: (!) 106 100  Resp: 19 20  Temp: 36.8 C 36.7 C  SpO2: 100% 96%    Last Pain:  Vitals:   08/26/21 0715  TempSrc: Oral  PainSc:                  Lonia Mad

## 2021-08-26 NOTE — Procedures (Signed)
Patient Name: [name]  MRN: [id number]  Epilepsy Attending: Charlsie Quest  Referring Physician/Provider: Dr Levon Hedger Date: 08/26/2021  Duration: 25.42 mins  Patient history: 75 year old male status post cardiac arrest.  EEG to evaluate for seizures.  Level of alertness:  comatose  AEDs during EEG study: None  Technical aspects: This EEG study was done with scalp electrodes positioned according to the 10-20 International system of electrode placement. Electrical activity was acquired at a sampling rate of 500Hz  and reviewed with a high frequency filter of 70Hz  and a low frequency filter of 1Hz . EEG data were recorded continuously and digitally stored.   Description: EEG showed continuous generalized background suppression.  EEG was not reactive to tactile stimulation.  Hyperventilation and photic stimulation were not performed.     ABNORMALITY -Background suppression, generalized  IMPRESSION: This study is suggestive of profound diffuse encephalopathy, nonspecific etiology. No seizures or epileptiform discharges were seen throughout the recording.  [name] 

## 2021-08-27 DIAGNOSIS — I249 Acute ischemic heart disease, unspecified: Secondary | ICD-10-CM | POA: Diagnosis not present

## 2021-08-27 DIAGNOSIS — I469 Cardiac arrest, cause unspecified: Secondary | ICD-10-CM | POA: Diagnosis not present

## 2021-08-27 LAB — POCT I-STAT 7, (LYTES, BLD GAS, ICA,H+H)
Acid-Base Excess: 4 mmol/L — ABNORMAL HIGH (ref 0.0–2.0)
Bicarbonate: 26.6 mmol/L (ref 20.0–28.0)
Calcium, Ion: 1.11 mmol/L — ABNORMAL LOW (ref 1.15–1.40)
HCT: 38 % — ABNORMAL LOW (ref 39.0–52.0)
Hemoglobin: 12.9 g/dL — ABNORMAL LOW (ref 13.0–17.0)
O2 Saturation: 100 %
Patient temperature: 37.1
Potassium: 3.6 mmol/L (ref 3.5–5.1)
Sodium: 135 mmol/L (ref 135–145)
TCO2: 28 mmol/L (ref 22–32)
pCO2 arterial: 34.2 mmHg (ref 32.0–48.0)
pH, Arterial: 7.498 — ABNORMAL HIGH (ref 7.350–7.450)
pO2, Arterial: 158 mmHg — ABNORMAL HIGH (ref 83.0–108.0)

## 2021-08-27 LAB — MAGNESIUM
Magnesium: 1.8 mg/dL (ref 1.7–2.4)
Magnesium: 2.2 mg/dL (ref 1.7–2.4)
Magnesium: 2.1 mg/dL (ref 1.7–2.4)

## 2021-08-27 LAB — CBC
HCT: 38.2 % — ABNORMAL LOW (ref 39.0–52.0)
Hemoglobin: 13.2 g/dL (ref 13.0–17.0)
MCH: 31.5 pg (ref 26.0–34.0)
MCHC: 34.6 g/dL (ref 30.0–36.0)
MCV: 91.2 fL (ref 80.0–100.0)
Platelets: 301 10*3/uL (ref 150–400)
RBC: 4.19 MIL/uL — ABNORMAL LOW (ref 4.22–5.81)
RDW: 14.3 % (ref 11.5–15.5)
WBC: 16.8 10*3/uL — ABNORMAL HIGH (ref 4.0–10.5)
nRBC: 0 % (ref 0.0–0.2)

## 2021-08-27 LAB — GLUCOSE, CAPILLARY
Glucose-Capillary: 133 mg/dL — ABNORMAL HIGH (ref 70–99)
Glucose-Capillary: 138 mg/dL — ABNORMAL HIGH (ref 70–99)
Glucose-Capillary: 142 mg/dL — ABNORMAL HIGH (ref 70–99)
Glucose-Capillary: 156 mg/dL — ABNORMAL HIGH (ref 70–99)
Glucose-Capillary: 159 mg/dL — ABNORMAL HIGH (ref 70–99)
Glucose-Capillary: 171 mg/dL — ABNORMAL HIGH (ref 70–99)
Glucose-Capillary: 175 mg/dL — ABNORMAL HIGH (ref 70–99)
Glucose-Capillary: 181 mg/dL — ABNORMAL HIGH (ref 70–99)
Glucose-Capillary: 232 mg/dL — ABNORMAL HIGH (ref 70–99)
Glucose-Capillary: 252 mg/dL — ABNORMAL HIGH (ref 70–99)
Glucose-Capillary: 306 mg/dL — ABNORMAL HIGH (ref 70–99)
Glucose-Capillary: 82 mg/dL (ref 70–99)

## 2021-08-27 LAB — BASIC METABOLIC PANEL
Anion gap: 13 (ref 5–15)
BUN: 38 mg/dL — ABNORMAL HIGH (ref 8–23)
CO2: 23 mmol/L (ref 22–32)
Calcium: 8.6 mg/dL — ABNORMAL LOW (ref 8.9–10.3)
Chloride: 98 mmol/L (ref 98–111)
Creatinine, Ser: 1.71 mg/dL — ABNORMAL HIGH (ref 0.61–1.24)
GFR, Estimated: 41 mL/min — ABNORMAL LOW (ref >60)
Glucose, Bld: 164 mg/dL — ABNORMAL HIGH (ref 70–99)
Potassium: 3.6 mmol/L (ref 3.5–5.1)
Sodium: 134 mmol/L — ABNORMAL LOW (ref 135–145)

## 2021-08-27 LAB — PHOSPHORUS
Phosphorus: 3.3 mg/dL (ref 2.5–4.6)
Phosphorus: 3.7 mg/dL (ref 2.5–4.6)
Phosphorus: 3.7 mg/dL (ref 2.5–4.6)

## 2021-08-27 LAB — TRIGLYCERIDES: Triglycerides: 91 mg/dL (ref <150)

## 2021-08-27 MED ORDER — INSULIN ASPART 100 UNIT/ML IJ SOLN: 2.0000 [IU] | INTRAMUSCULAR | Status: DC

## 2021-08-27 MED ORDER — INSULIN ASPART 100 UNIT/ML IJ SOLN
0.0000 [IU] | INTRAMUSCULAR | Status: DC
  Administered 2021-08-27: 5 [IU] via SUBCUTANEOUS
  Administered 2021-08-27: 7 [IU] via SUBCUTANEOUS
  Administered 2021-08-27: 3 [IU] via SUBCUTANEOUS
  Administered 2021-08-28: 7 [IU] via SUBCUTANEOUS
  Administered 2021-08-28: 5 [IU] via SUBCUTANEOUS

## 2021-08-27 MED ORDER — SENNOSIDES-DOCUSATE SODIUM 8.6-50 MG PO TABS: 1.0000 | ORAL_TABLET | Freq: Every evening | ORAL | Status: DC | PRN

## 2021-08-27 MED ORDER — ADULT MULTIVITAMIN W/MINERALS CH
1.0000 | ORAL_TABLET | Freq: Every day | ORAL | Status: DC
  Administered 2021-08-27 – 2021-08-29 (×3): 1
  Filled 2021-08-27 (×3): qty 1

## 2021-08-27 MED ORDER — BUSPIRONE HCL 10 MG PO TABS
30.0000 mg | ORAL_TABLET | Freq: Three times a day (TID) | ORAL | Status: DC
  Administered 2021-08-28 – 2021-08-29 (×4): 30 mg
  Filled 2021-08-27 (×6): qty 3

## 2021-08-27 MED ORDER — VITAL AF 1.2 CAL PO LIQD
1000.0000 mL | ORAL | Status: DC
  Administered 2021-08-27 – 2021-08-28 (×2): 1000 mL

## 2021-08-27 MED ORDER — ACETAMINOPHEN 325 MG PO TABS
650.0000 mg | ORAL_TABLET | ORAL | Status: DC
  Administered 2021-08-27 – 2021-08-29 (×3): 650 mg
  Filled 2021-08-27 (×3): qty 2

## 2021-08-27 MED ORDER — VITAL HIGH PROTEIN PO LIQD: 1000.0000 mL | ORAL | Status: DC

## 2021-08-27 MED ORDER — PROSOURCE TF PO LIQD: 45.0000 mL | Freq: Two times a day (BID) | ORAL | Status: DC

## 2021-08-27 MED ORDER — ACETAMINOPHEN 650 MG RE SUPP
650.0000 mg | RECTAL | Status: DC
  Filled 2021-08-27: qty 1

## 2021-08-27 MED ORDER — PROSOURCE TF PO LIQD
45.0000 mL | Freq: Three times a day (TID) | ORAL | Status: DC
  Administered 2021-08-27 – 2021-08-29 (×8): 45 mL
  Filled 2021-08-27 (×8): qty 45

## 2021-08-27 MED ORDER — ACETAMINOPHEN 160 MG/5ML PO SOLN
650.0000 mg | ORAL | Status: DC
  Administered 2021-08-27 – 2021-08-29 (×10): 650 mg
  Filled 2021-08-27 (×10): qty 20.3

## 2021-08-27 MED ORDER — BUSPIRONE HCL 10 MG PO TABS
30.0000 mg | ORAL_TABLET | Freq: Three times a day (TID) | ORAL | Status: DC
  Administered 2021-08-27 – 2021-08-28 (×3): 30 mg
  Filled 2021-08-27: qty 3

## 2021-08-27 MED ORDER — MAGNESIUM SULFATE 2 GM/50ML IV SOLN
2.0000 g | Freq: Once | INTRAVENOUS | Status: AC
  Administered 2021-08-27: 2 g via INTRAVENOUS
  Filled 2021-08-27: qty 50

## 2021-08-27 MED ORDER — INSULIN GLARGINE-YFGN 100 UNIT/ML ~~LOC~~ SOLN
15.0000 [IU] | Freq: Two times a day (BID) | SUBCUTANEOUS | Status: DC
  Administered 2021-08-27 (×2): 15 [IU] via SUBCUTANEOUS
  Filled 2021-08-27 (×4): qty 0.15

## 2021-08-27 MED ORDER — INSULIN ASPART 100 UNIT/ML IJ SOLN
4.0000 [IU] | INTRAMUSCULAR | Status: DC
  Administered 2021-08-27 – 2021-08-28 (×4): 4 [IU] via SUBCUTANEOUS

## 2021-08-27 MED ORDER — POTASSIUM CHLORIDE 10 MEQ/50ML IV SOLN
10.0000 meq | INTRAVENOUS | Status: AC
  Administered 2021-08-27 (×4): 10 meq via INTRAVENOUS
  Filled 2021-08-27 (×4): qty 50

## 2021-08-27 MED ORDER — NOREPINEPHRINE 16 MG/250ML-% IV SOLN
0.0000 ug/min | INTRAVENOUS | Status: DC
  Administered 2021-08-27: 10 ug/min via INTRAVENOUS
  Filled 2021-08-27 (×2): qty 250

## 2021-08-27 NOTE — Progress Notes (Addendum)
[facility]  Subjective  - Intubated and sedated   Objective 92/67 (!) 103 99 F (37.2 C) (!) 22 100%  Intake/Output Summary (Last 24 hours) at 08/27/2021 0704 Last data filed at 08/27/2021 0700 Gross per 24 hour  Intake 1828.55 ml  Output 1935 ml  Net -106.45 ml   Edematous in all extremities Doppler flow to left AT/PT, right UE doppler ulnar and radial signals brisk Right arm warm without ischemic changes Lungs intubated Heart sinus tach 105-125 Wound vac to suction 25 cc bloody Op last 25 hours  Assessment/Planning: POD # 5 TMA revision status post PEA arrest 08/26/21   Vac change kit ordered to bedside  Levo down to 8 mcg Well perfused ext. With doppler signals Urine OP > 1,900 ml, Cr 1.71 Heparin SQ for DVT prophylaxis  K+ 3.6, Mg 1.8 Replaced per protocol  profound diffuse encephalopathy CCM talked with family  DNR entered Pending extubation over time and decreased pressor support as he tolerates.    Roxy Horseman 08/27/2021 7:04 AM --  Laboratory Lab Results: Recent Labs    08/26/21 1506 08/27/21 0408 08/27/21 0414  WBC 19.9* 16.8*  --   HGB 13.6 13.2 12.9*  HCT 40.9 38.2* 38.0*  PLT 335 301  --    BMET Recent Labs    08/26/21 1506 08/27/21 0408 08/27/21 0414  NA 132* 134* 135  K 4.1 3.6 3.6  CL 95* 98  --   CO2 20* 23  --   GLUCOSE 400* 164*  --   BUN 33* 38*  --   CREATININE 1.78* 1.71*  --   CALCIUM 8.5* 8.6*  --     COAG Lab Results  Component Value Date   INR 1.4 09/22/2007   INR 1.0 09/17/2007   INR 0.9 09/16/2007   No results found for: PTT  I have independently interviewed and examined patient and agree with PA assessment and plan above.  We will plan to change [facility] tomorrow.  Appreciate critical care of this patient.  Davionne Mastrangelo C. Donzetta Matters, MD [facility] Office: [phone] Pager: [phone]

## 2021-08-27 NOTE — Progress Notes (Signed)
LTM maint complete - no skin breakdown under: Fp1 F3 Fz [facility] monitored, Event button test confirmed by [facility].

## 2021-08-27 NOTE — TOC Transition Note (Addendum)
Transition of Care [facility]) - CM/SW Discharge Note   Patient Details  Name: [name] MRN: [id number] Date of Birth: 19-Apr-1946  Transition of Care [facility]) CM/SW Contact:  Gala Lewandowsky, RN Phone Number: 08/27/2021, 1:18 PM   Clinical Narrative:  Previous Case Manager initiated the consult for the wound vac. Order forms have been signed and are on the shadow chart. Case Manager reached out to the staff RN and the patient is currently intubated and sedated. Case Manager will follow for progression of the patient to see if the order should be faxed to [facility]. Case Manager has reached out to the [facility] as well. [facility] needed the [place] PCP to initiate a consult. Case Manager will continue to follow the patient.     08-27-21 1406 Case Manager received a call from the [facility]- the patient is serviced at the [place] location. Case Manager called CSW Mallie Darting at [phone] ext [phone] to discuss the patient.     08-28-21  1618 Case Manager received a call back from the [place] CSW- The [place] needs the completed wound vac order-the length of time the patient will need the wound vac and the order for dressing supplies and canisters. Orders will need to be faxed to [phone]. CSW to call me back if the [facility] RN needs to get involved. Case Manager will continue to follow the progression of the patient.   Final next level of care: Home w Home Health Services Barriers to Discharge: Continued Medical Work up   Patient Goals and CMS Choice Patient states their goals for this hospitalization and ongoing recovery are:: return home CMS Medicare.gov Compare Post Acute Care list provided to:: Patient Choice offered to / list presented to : Patient   Discharge Plan and Services   Discharge Planning Services: CM Consult Post Acute Care Choice: Durable Medical Equipment, Home Health          DME Arranged: Vac DME Agency: [facility] Date DME Agency Contacted: 08/26/21    Representative spoke with at DME Agency: [facility] Arranged: RN [facility] Agency: [facility]      Readmission Risk Interventions No flowsheet data found.

## 2021-08-27 NOTE — Progress Notes (Signed)
K+ 3.6, Mg 1.8 Replaced per protocol  

## 2021-08-27 NOTE — Progress Notes (Signed)
   08/27/21 1000  Clinical Encounter Type  Visited With Family;Health care provider  Visit Type Follow-up;Critical Care  Referral From Nurse  Consult/Referral To Chaplain   [facility] visited pt. per phone referral yesterday to [place] office; pt. intubated and sedated, wearing EEG leads.  RN says pt. has not been waking up and family plan to wait until tomorrow before making decision re: continuing life support.  Family in lobby; [facility] met briefly with one family member who declined any support needs.  Chaplains remain available as needed.

## 2021-08-27 NOTE — Procedures (Addendum)
Patient Name: [name]  MRN: [id number]  Epilepsy Attending: Charlsie Quest  Referring Physician/Provider: Dr Levon Hedger  Duration: 08/26/2021 1423 to 08/27/2021 1423   Patient history: 75 year old male status post cardiac arrest.  EEG to evaluate for seizures.   Level of alertness:  comatose   AEDs during EEG study: None   Technical aspects: This EEG study was done with scalp electrodes positioned according to the 10-20 International system of electrode placement. Electrical activity was acquired at a sampling rate of 500Hz  and reviewed with a high frequency filter of 70Hz  and a low frequency filter of 1Hz . EEG data were recorded continuously and digitally stored.    Description: EEG initially showed continuous generalized background suppression.  After around 2100 on 08/26/2021 EEG showed intermittent generalized periodic discharges with fluctuating frequency from 0.5 to 1 Hz as well as continuous generalized polymorphic mixed frequencies with 5 to 9 Hz theta-alpha activity as well as 2 to 3 Hz delta slowing.  Hyperventilation and photic stimulation were not performed.      ABNORMALITY - Background suppression, generalized - Periodic discharges, generalized - Continuous slow, generalized   IMPRESSION: This study was initially suggestive of profound diffuse encephalopathy.  Gradually after around 2100 on 08/26/2021, EEG showed evidence of epileptogenicity with generalized onset as well as severe diffuse encephalopathy.  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    Christen Wardrop 10/26/2021

## 2021-08-27 NOTE — Progress Notes (Signed)
EEG maintenance performed. No skin breakdown noted.  °

## 2021-08-27 NOTE — Progress Notes (Addendum)
Initial Nutrition Assessment  DOCUMENTATION CODES:   Not applicable  INTERVENTION:   Tube feeding via OG tube: Change to Vital AF 1.2 at 25 ml/h, increase by 10 ml every 4 hours to goal rate of 65 ml/h (1560 ml per day) Prosource TF 45 ml TID  Provides 1992 kcal, 150 gm protein, 1265 ml free water daily  MVI with minerals daily via tube  NUTRITION DIAGNOSIS:   Increased nutrient needs related to wound healing as evidenced by estimated needs.  GOAL:   Patient will meet greater than or equal to 90% of their needs  MONITOR:   Vent status, Labs, TF tolerance, Skin  REASON FOR ASSESSMENT:   Ventilator, Consult Enteral/tube feeding initiation and management  ASSESSMENT:   75 yo male admitted with nonhealing left TMA wound. S/P TMA revision with wound VAC placement 9/2. PMH includes CAD, DM-2, depression, HLD, OSA, PVD, stroke, PTSD.  9/6 S/P PEA arrest, transferred to the ICU and intubated. S/P 37 degree TTM. Continuous EEG ongoing.  Patient remains intubated on ventilator support MV: 11.7 L/min Temp (24hrs), Avg:98.8 F (37.1 C), Min:96.8 F (36 C), Max:99.3 F (37.4 C)   Received MD Consult for TF initiation and management. Currently ordered Vital High Protein at 40 ml/h with Prosource TF 45 ml BID.    Labs reviewed.  CBG: [id number]  Medications reviewed and include Colace, Novolog, Semglee, Protonix, Miralax, insulin drip, levophed, KCl.  Prior to intubation patient was receiving a carbohydrate modified diet with good intake (50-100% POs).   Weight history reviewed. Weight was 95 kg one month ago. Admission weight 92.4 kg. 3% weight decrease in one month is not significant. Suspect weight fluctuations were related to fluid status, edema associated with foot wound.  VAC output 25 ml x 24 hours  Intake/Output Summary (Last 24 hours) at 08/27/2021 1040 Last data filed at 08/27/2021 1000 Gross per 24 hour  Intake 1927.09 ml  Output 1780 ml  Net 147.09 ml      NUTRITION - FOCUSED PHYSICAL EXAM:  Flowsheet Row Most Recent Value  Orbital Region No depletion  Upper Arm Region No depletion  Thoracic and Lumbar Region No depletion  Buccal Region Unable to assess  Temple Region No depletion  Clavicle Bone Region No depletion  Clavicle and Acromion Bone Region No depletion  Scapular Bone Region Unable to assess  Dorsal Hand No depletion  Patellar Region No depletion  Anterior Thigh Region No depletion  Posterior Calf Region Mild depletion  Edema (RD Assessment) Mild  Hair Reviewed  Eyes Unable to assess  Mouth Unable to assess  Skin Reviewed  Nails Reviewed       Diet Order:   Diet Order             Diet NPO time specified  Diet effective now                   EDUCATION NEEDS:   Not appropriate for education at this time  Skin:  Skin Assessment: Skin Integrity Issues: Skin Integrity Issues:: Wound VAC Wound Vac: L foot incision s/p TMA revision 9/2  Last BM:  9/6  Height:   Ht Readings from Last 1 Encounters:  08-Sep-2021 5\' 8"  (1.727 m)    Weight:   Wt Readings from Last 1 Encounters:  September 08, 2021 93.3 kg    BMI:  Body mass index is 31.26 kg/m.  Estimated Nutritional Needs:   Kcal:  1950  Protein:  140-150 gm  Fluid:  >/= 2 L  Lucas Mallow, RD, LDN, CNSC Please refer to [facility] for contact information.

## 2021-08-27 NOTE — Progress Notes (Signed)
OT Cancellation Note  Patient Details Name: [name] MRN: [id number] DOB: Feb 18, 1946   Cancelled Treatment:    Reason Eval/Treat Not Completed: Medical issues which prohibited therapy (Pt intubated and sedated.)  Evern Bio 08/27/2021, 12:23 PM Martie Round, OTR/L Acute Rehabilitation Services Pager: [phone] Office: [phone]

## 2021-08-27 NOTE — Progress Notes (Signed)
NAME:  [name], MRN:  [id number], DOB:  1946-08-12, LOS: 5 ADMISSION DATE:  08/26/2021, CONSULTATION DATE:  08/26/2021 REFERRING MD:  Dr. Chestine Spore, CHIEF COMPLAINT:  PEA Arrest.    History of Present Illness:  75 year old male presents 9/2 for nonhealing transmetatarsal amputation wound for revision with wound vac placement. 9/6 noted to be bradycardiac and unresponsive, given 1 EPI, and then proceed to PEA arrest, 20 minutes prior to ROSC, given EPI, Mag, Bicarb, and Amiodarone, 3 Shocks (appears to be SVT).   WBC 9.8. Hemoglobin 12.9, K 4.4, Crt 1.28   On arrival to ICU levophed is infusing in right arm which was noted to be infiltrated, patient is intubated and unresponsive. HR 100, BP 109/17. Oxygen Saturation 95%.   Pertinent  Medical History  CAD, DM, Depression, HLD, OSA, PVD  Significant Hospital Events: Including procedures, antibiotic start and stop dates in addition to other pertinent events   9/2 OR for revision  9/6 PEA arrest   Interim History / Subjective:  Poorly responsive on cEEG, vent.  Objective   Blood pressure 92/67, pulse (!) 123, temperature 99 F (37.2 C), resp. rate (!) 22, height  (1.727 m), weight 93.3 kg, SpO2 100 %.    Vent Mode: PRVC FiO2 (%):  [40 %-100 %] 40 % Set Rate:  [22 bmp-26 bmp] 22 bmp Vt Set:  [540 mL] 540 mL PEEP:  [5 cmH20-8 cmH20] 8 cmH20 Plateau Pressure:  [18 cmH20-23 cmH20] 18 cmH20   Intake/Output Summary (Last 24 hours) at 08/27/2021 0746 Last data filed at 08/27/2021 0700 Gross per 24 hour  Intake 1828.55 ml  Output 1585 ml  Net 243.55 ml    Filed Weights   08/21/21 1211 09/19/2021 0655  Weight: 92.4 kg 93.3 kg    Examination: No distress ETT with minimal secretions Scattered rhonci on auscultation of lungs Heart sounds regular, ext warm R arm looks better/ less ischemic Pupils equal, small, reactive, +doll's, +corneals on L not on R, +cough/gag, opens eyes to voice, no tracking, no response to pain but on  fentanyl L foot wound vac in place  ABG looks good Making urine, Cr okay Minimal trop bump CBC stable Formal echo read reviewed  Resolved Hospital Problem list     Assessment & Plan:  In hospital cardiac arrest in setting of baseline CAD, prior CABG, metabolic syndrome.  Also has been having tachy/brady on tele upstairs per report.  No preceding anginal symptoms. Question primary arrhythmia as cause.  Bladder also distended so suppose it could have been exaggerated vagal response.  POC Hgb stable, abdominal exam benign so doubt anything there.  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  Resp failure on vent with aspiration on CXR   Post arrest encephalopathy ongoing   Post arrest shock- improved but not resolved   R arm ischemia from epi extravasation during code to R AC- improved with phentolamine   Postop TMA revision   Baseline DM2/HLD/HTN/Obesity/CAD  - Continue vent support, VAP prevention bundle - cEEG ongoing, no seizures - CTX x 5 days for chemical aspiration - Start TF, transition of insulin gtt, apprecate pharmD help - Wound vac per primary - Continue TTM and sedation through tomorrow, lighten sedation Friday and probably MRI with GOC talk if signs of anoxic injury, patient did not want SNF so low threshold to offer comfort care - Consider LHC if neuro status improves - Family updated regarding timeline  35 min cc time Jesusita Oka  Katrinka Blazing MD PCCM   Best Practice (right click and "Reselect all SmartList Selections" daily)   Diet/type: NPO DVT prophylaxis: prophylactic heparin  GI prophylaxis: PPI Lines: Central line Foley:  Yes, and it is still needed Code Status:  full code Last date of multidisciplinary goals of care discussion [pending]  Labs   CBC: Recent Labs  Lab 08/24/21 0054 08/25/21 0812 08/26/21 0934 08/26/21 0942 08/26/21 0950 08/26/21 1506 08/27/21 0408 08/27/21 0414  WBC 12.8* 9.8 14.4*  --   --  19.9* 16.8*   --   NEUTROABS  --   --  10.2*  --   --   --   --   --   HGB 13.1 12.9* 13.5 13.3 13.3 13.6 13.2 12.9*  HCT 40.8 39.6 41.1 39.0 39.0 40.9 38.2* 38.0*  MCV 96.7 97.1 98.3  --   --  95.6 91.2  --   PLT 222 218 299  --   --  335 301  --      Basic Metabolic Panel: Recent Labs  Lab 08/24/21 0054 08/25/21 0812 08/26/21 0934 08/26/21 0942 08/26/21 0950 08/26/21 1506 08/27/21 0408 08/27/21 0414  NA 134* 137 136 135 135 132* 134* 135  K 6.0* 4.4 4.0 3.8 4.2 4.1 3.6 3.6  CL 98 98 94*  --   --  95* 98  --   CO2 21* 29 26  --   --  20* 23  --   GLUCOSE 185* 122* 277*  --   --  400* 164*  --   BUN 12 19 25*  --   --  33* 38*  --   CREATININE 1.41* 1.28* 1.82*  --   --  1.78* 1.71*  --   CALCIUM 9.1 9.1 8.7*  --   --  8.5* 8.6*  --   MG  --   --  2.5*  --   --   --  1.8  --   PHOS  --   --   --   --   --   --  3.3  --     GFR: Estimated Creatinine Clearance: 42 mL/min (A) (by C-G formula based on SCr of 1.71 mg/dL (H)). Recent Labs  Lab 08/25/21 0812 08/26/21 0934 08/26/21 1500 08/26/21 1506 08/27/21 0408  WBC 9.8 14.4*  --  19.9* 16.8*  LATICACIDVEN  --  8.1* 7.5*  --   --      Liver Function Tests: Recent Labs  Lab 08/26/21 0934  AST 240*  ALT 138*  ALKPHOS 109  BILITOT 1.0  PROT 6.0*  ALBUMIN 2.6*   No results for input(s): LIPASE, AMYLASE in the last 168 hours. No results for input(s): AMMONIA in the last 168 hours.  ABG    Component Value Date/Time   PHART 7.498 (H) 08/27/2021 0414   PCO2ART 34.2 08/27/2021 0414   PO2ART 158 (H) 08/27/2021 0414   HCO3 26.6 08/27/2021 0414   TCO2 28 08/27/2021 0414   ACIDBASEDEF 2.0 08/26/2021 0942   O2SAT 100.0 08/27/2021 0414      Coagulation Profile: No results for input(s): INR, PROTIME in the last 168 hours.  Cardiac Enzymes: No results for input(s): CKTOTAL, CKMB, CKMBINDEX, TROPONINI in the last 168 hours.  HbA1C: Hgb A1c MFr Bld  Date/Time Value Ref Range Status  07/11/2021 05:47 PM 7.2 (H) 4.8 - 5.6 %  Final    Comment:    (NOTE)         Prediabetes: 5.7 - 6.4  Diabetes: >6.4         Glycemic control for adults with diabetes: <7.0   09/17/2007 03:00 AM (H)  Final   7.6 (NOTE)   The ADA recommends the following therapeutic goals for glycemic   control related to Hgb A1C measurement:   Goal of Therapy:   < 7.0% Hgb A1C   Action Suggested:  > 8.0% Hgb A1C   Ref:  Diabetes Care, 22, Suppl. 1, 1999    CBG: Recent Labs  Lab 08/27/21 0041 08/27/21 0146 08/27/21 0303 08/27/21 0407 08/27/21 0623  GLUCAP 181* 175* 156* 171* 142*     Review of Systems:   Unable to review as patient is intubated and unresponsive   Past Medical History:  He,  has a past medical history of CAD, AUTOLOGOUS BYPASS GRAFT, Depression, DIABETES, TYPE 2, DYSPNEA, HYPERLIPIDEMIA, OBESITY, OBSTRUCTIVE SLEEP APNEA, Old myocardial infarction (2005), Peripheral vascular disease (HCC), PTSD (post-traumatic stress disorder), and Stroke (HCC) (09/2007).   Surgical History:   Past Surgical History:  Procedure Laterality Date   ABDOMINAL AORTOGRAM W/LOWER EXTREMITY Bilateral 03/10/2021   Procedure: ABDOMINAL AORTOGRAM W/LOWER EXTREMITY;  Surgeon: Maeola Harman, MD;  Location: [facility] INVASIVE CV LAB;  Service: Cardiovascular;  Laterality: Bilateral;   ABDOMINAL AORTOGRAM W/LOWER EXTREMITY N/A 07/14/2021   Procedure: ABDOMINAL AORTOGRAM W/LOWER EXTREMITY;  Surgeon: Maeola Harman, MD;  Location[facility] INVASIVE CV LAB;  Service: Cardiovascular;  Laterality: N/A;   AMPUTATION Left 03/18/2021   Procedure: LEFT FOURTH TOE AMPUTATION;  Surgeon: Maeola Harman, MD;  Location: [facility] OR;  Service: Vascular;  Laterality: Left;   APPENDECTOMY  1965   APPLICATION OF WOUND VAC Left 08/30/2021   Procedure: APPLICATION OF WOUND VAC;  Surgeon: Cephus Shelling, MD;  Location: [facility] OR;  Service: Vascular;  Laterality: Left;   CORONARY ARTERY BYPASS GRAFT  2008   at [place]   cyst remove on lelt  leg,posterior     LOWER EXTREMITY ANGIOGRAPHY N/A 03/07/2018   Procedure: LOWER EXTREMITY ANGIOGRAPHY;  Surgeon: Maeola Harman, MD;  Location: [facility] INVASIVE CV LAB;  Service: Cardiovascular;  Laterality: N/A;   LOWER EXTREMITY ANGIOGRAPHY Bilateral 05/29/2019   Procedure: LOWER EXTREMITY ANGIOGRAPHY;  Surgeon: Maeola Harman, MD;  Location: [facility] INVASIVE CV LAB;  Service: Cardiovascular;  Laterality: Bilateral;   PERIPHERAL VASCULAR ATHERECTOMY Right 03/07/2018   Procedure: PERIPHERAL VASCULAR ATHERECTOMY;  Surgeon: Maeola Harman, MD;  Location: [facility] INVASIVE CV LAB;  Service: Cardiovascular;  Laterality: Right;  anterioer tibial   TRANSMETATARSAL AMPUTATION Left 07/16/2021   Procedure: LEFT TRANSMETATARSAL AMPUTATION;  Surgeon: Maeola Harman, MD;  Location[facility] OR;  Service: Vascular;  Laterality: Left;   TRANSMETATARSAL AMPUTATION Left 09/15/2021   Procedure: LEFT TRANSMETATARSAL AMPUTATION REVISION;  Surgeon: Cephus Shelling, MD;  Location: [facility] OR;  Service: Vascular;  Laterality: Left;   VASECTOMY     WOUND DEBRIDEMENT Left 05/02/2021   Procedure: Left 4th toe amputation site debridement including metatarsal head;  Surgeon: Cephus Shelling, MD;  Location: [facility] OR;  Service: Vascular;  Laterality: Left;     Social History:   reports that he has quit smoking. His smoking use included pipe. He has never used smokeless tobacco. He reports that he does not drink alcohol and does not use drugs.   Family History:  His family history includes Alcoholism in his father; Diabetes in his mother.   Allergies Allergies  Allergen Reactions   Ace Inhibitors     Heart attack    Lisinopril  Heart attack     Home Medications  Prior to Admission medications   Medication Sig Start Date End Date Taking? Authorizing Provider  acetaminophen (TYLENOL) 500 MG tablet Take 1 tablet (500 mg total) by mouth every 4 (four) hours as needed for moderate pain  (pain). Patient taking differently: Take 1,500 mg by mouth 2 (two) times daily as needed for moderate pain (pain). 07/17/21  Yes Ellison CarwinZinoviev, Eva, MD  aspirin EC 325 MG tablet Take 325 mg by mouth in the morning.   Yes [provider]  atorvastatin (LIPITOR) 40 MG tablet Take 40 mg by mouth in the morning.   Yes [provider]  Cholecalciferol 2000 units TABS Take 2,000 Units by mouth in the morning.   Yes [provider]  clopidogrel (PLAVIX) 75 MG tablet Take 75 mg by mouth in the morning.   Yes [provider]  Coenzyme Q10 (COQ-10) 200 MG CAPS Take 200 mg by mouth in the morning.   Yes [provider]  DULoxetine (CYMBALTA) 30 MG capsule Take 30 mg by mouth daily.   Yes [provider]  furosemide (LASIX) 40 MG tablet Take 40 mg by mouth in the morning.   Yes [provider]  insulin aspart protamine- aspart (NOVOLOG MIX 70/30) (70-30) 100 UNIT/ML injection Inject 30 Units into the skin in the morning and at bedtime.   Yes [provider]  metFORMIN (GLUCOPHAGE) 1000 MG tablet Take 1,000 mg by mouth in the morning and at bedtime.   Yes [provider]  metoprolol tartrate (LOPRESSOR) 50 MG tablet Take 50 mg by mouth 2 (two) times daily.   Yes [provider]  Misc Natural Products (NEURIVA) CAPS Take 1 capsule by mouth at bedtime.   Yes [provider]  Multiple Vitamin (MULTIVITAMIN WITH MINERALS) TABS tablet Take 1 tablet by mouth daily. Men's 50+ Patient taking differently: Take 1 tablet by mouth 2 (two) times daily. Men's 50+ 07/17/21  Yes Ellison CarwinZinoviev, Eva, MD  OVER THE COUNTER MEDICATION Take 1 tablet by mouth 3 (three) times daily with meals. Sugar Blockers   Yes [provider]  OVER THE COUNTER MEDICATION Take 2 tablets by mouth at bedtime. olly sleep   Yes [provider]  Semaglutide, 1 MG/DOSE, (OZEMPIC, 1 MG/DOSE,) 4 MG/3ML SOPN Inject 1 mg into the skin every Monday.   Yes  [provider]  traZODone (DESYREL) 50 MG tablet Take 25 mg by mouth at bedtime.    Yes [provider]  APPLE CIDER VINEGAR PO Take 1 capsule by mouth daily.    [provider]  Charcoal Activated (ACTIVATED CHARCOAL PO) Take 1,040 mg by mouth daily.    [provider]  Chilton SiGreen Tea, Camellia sinensis, (GREEN TEA EXTRACT PO) Take 400 mg by mouth 2 (two) times a day.    [provider]  nitroGLYCERIN (NITROSTAT) 0.4 MG SL tablet Place 0.4 mg under the tongue every 5 (five) minutes x 3 doses as needed for chest pain.    [provider]  RESVERATROL PO Take 500 mg by mouth daily.    [provider]    CRITICAL CARE Performed by: Lorin Glassaniel C Crockett Rallo   Total critical care time: 42 minutes  Critical care time was exclusive of separately billable procedures and treating other patients.  Critical care was necessary to treat or prevent imminent or life-threatening deterioration.  Critical care was time spent personally by me on the following activities: development of treatment plan with patient  and/or surrogate as well as nursing, discussions with consultants, evaluation of patient's response to treatment, examination of patient, obtaining history from patient or surrogate, ordering and performing treatments and interventions, ordering and review of laboratory studies, ordering and review of radiographic studies, pulse oximetry and re-evaluation of patient's condition.  Jovita Kussmaul, AGACNP-BC [facility]  PCCM Pgr: [phone]

## 2021-08-27 NOTE — Progress Notes (Signed)
eLink Physician-Brief Progress Note Patient Name: [name] DOB: 07-11-1946 MRN: [id number]   Date of Service  08/27/2021  HPI/Events of Note  ABG result reviewed.  eICU Interventions  RT has already reduced the FIO2 to 40 %, RT instructed to reduce respiratory rate from 26 to 22 to normalize PH.        [name] 08/27/2021, 4:33 AM

## 2021-08-28 DIAGNOSIS — I249 Acute ischemic heart disease, unspecified: Secondary | ICD-10-CM | POA: Diagnosis not present

## 2021-08-28 LAB — CBC
HCT: 34.1 % — ABNORMAL LOW (ref 39.0–52.0)
Hemoglobin: 11.2 g/dL — ABNORMAL LOW (ref 13.0–17.0)
MCH: 31.1 pg (ref 26.0–34.0)
MCHC: 32.8 g/dL (ref 30.0–36.0)
MCV: 94.7 fL (ref 80.0–100.0)
Platelets: 271 10*3/uL (ref 150–400)
RBC: 3.6 MIL/uL — ABNORMAL LOW (ref 4.22–5.81)
RDW: 14.4 % (ref 11.5–15.5)
WBC: 12.1 10*3/uL — ABNORMAL HIGH (ref 4.0–10.5)
nRBC: 0 % (ref 0.0–0.2)

## 2021-08-28 LAB — GLUCOSE, CAPILLARY
Glucose-Capillary: 215 mg/dL — ABNORMAL HIGH (ref 70–99)
Glucose-Capillary: 283 mg/dL — ABNORMAL HIGH (ref 70–99)
Glucose-Capillary: 325 mg/dL — ABNORMAL HIGH (ref 70–99)
Glucose-Capillary: 346 mg/dL — ABNORMAL HIGH (ref 70–99)
Glucose-Capillary: 363 mg/dL — ABNORMAL HIGH (ref 70–99)
Glucose-Capillary: 375 mg/dL — ABNORMAL HIGH (ref 70–99)

## 2021-08-28 LAB — BASIC METABOLIC PANEL
Anion gap: 10 (ref 5–15)
BUN: 36 mg/dL — ABNORMAL HIGH (ref 8–23)
CO2: 26 mmol/L (ref 22–32)
Calcium: 8.8 mg/dL — ABNORMAL LOW (ref 8.9–10.3)
Chloride: 96 mmol/L — ABNORMAL LOW (ref 98–111)
Creatinine, Ser: 1.57 mg/dL — ABNORMAL HIGH (ref 0.61–1.24)
GFR, Estimated: 46 mL/min — ABNORMAL LOW (ref >60)
Glucose, Bld: 324 mg/dL — ABNORMAL HIGH (ref 70–99)
Potassium: 4.5 mmol/L (ref 3.5–5.1)
Sodium: 132 mmol/L — ABNORMAL LOW (ref 135–145)

## 2021-08-28 LAB — PHOSPHORUS
Phosphorus: 2.9 mg/dL (ref 2.5–4.6)
Phosphorus: 3.2 mg/dL (ref 2.5–4.6)

## 2021-08-28 LAB — MAGNESIUM
Magnesium: 2.1 mg/dL (ref 1.7–2.4)
Magnesium: 2 mg/dL (ref 1.7–2.4)

## 2021-08-28 MED ORDER — INSULIN GLARGINE-YFGN 100 UNIT/ML ~~LOC~~ SOLN
20.0000 [IU] | Freq: Two times a day (BID) | SUBCUTANEOUS | Status: DC
  Administered 2021-08-28 (×2): 20 [IU] via SUBCUTANEOUS
  Filled 2021-08-28 (×4): qty 0.2

## 2021-08-28 MED ORDER — INSULIN ASPART 100 UNIT/ML IJ SOLN: 0.0000 [IU] | INTRAMUSCULAR | Status: DC

## 2021-08-28 MED ORDER — INSULIN ASPART 100 UNIT/ML IJ SOLN
0.0000 [IU] | INTRAMUSCULAR | Status: DC
  Administered 2021-08-28: 20 [IU] via SUBCUTANEOUS
  Administered 2021-08-28: 15 [IU] via SUBCUTANEOUS
  Administered 2021-08-28: 20 [IU] via SUBCUTANEOUS
  Administered 2021-08-29: 4 [IU] via SUBCUTANEOUS
  Administered 2021-08-29: 11 [IU] via SUBCUTANEOUS
  Administered 2021-08-29: 15 [IU] via SUBCUTANEOUS
  Administered 2021-08-29: 11 [IU] via SUBCUTANEOUS
  Administered 2021-08-29: 15 [IU] via SUBCUTANEOUS

## 2021-08-28 MED ORDER — INSULIN ASPART 100 UNIT/ML IJ SOLN
6.0000 [IU] | INTRAMUSCULAR | Status: DC
  Administered 2021-08-28 – 2021-08-29 (×6): 6 [IU] via SUBCUTANEOUS

## 2021-08-28 NOTE — Progress Notes (Addendum)
[facility]  Subjective  - Intubated sedated   Objective (!) 131/31 88 98.4 F (36.9 C) 12 100%  Intake/Output Summary (Last 24 hours) at 08/28/2021 0716 Last data filed at 08/28/2021 0600 Gross per 24 hour  Intake 2095.06 ml  Output 1295 ml  Net 800.06 ml    Brisk PT doppler signal as well as AT/DP signals left LE Wound vac to suction with good seal Right UE warm with brisk radial/ulnar signals well perfused Lungs intubated Heart A fib managed with amiodarone   Assessment/Planning: POD # 6 TMA revision status post PEA arrest 08/26/21  Well perfused left TMA with brisk doppler signals  Levo 7 mcg/min.  Decreased from yesterday Heart A fib on Amiodarone Urine OP > 1200 ml last 24, Cr decreasing with time kidney well perfused Vac change possibly tomorrow kit at bedside pending mental status Pending  EEG  IMPRESSION: This study was initially suggestive of profound diffuse encephalopathy.  Gradually after around 2100 on 08/26/2021, EEG showed evidence of epileptogenicity with generalized onset as well as severe diffuse encephalopathy.  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    Roxy Horseman 08/28/2021 7:16 AM --  Laboratory Lab Results: Recent Labs    08/27/21 0408 08/27/21 0414 08/28/21 0328  WBC 16.8*  --  12.1*  HGB 13.2 12.9* 11.2*  HCT 38.2* 38.0* 34.1*  PLT 301  --  271   BMET Recent Labs    08/27/21 0408 08/27/21 0414 08/28/21 0328  NA 134* 135 132*  K 3.6 3.6 4.5  CL 98  --  96*  CO2 23  --  26  GLUCOSE 164*  --  324*  BUN 38*  --  36*  CREATININE 1.71*  --  1.57*  CALCIUM 8.6*  --  8.8*    COAG Lab Results  Component Value Date   INR 1.4 09/22/2007   INR 1.0 09/17/2007   INR 0.9 09/16/2007   No results found for: PTT  I have independently interviewed and examined patient and agree with PA assessment and plan above. I have discussed grim  prognosis with family. Will defer further neurologic testing to PCCM. Wound vac change on hold for now.   Anabela Crayton C. Donzetta Matters, MD [facility] [place] Office: [phone] Pager: [phone]

## 2021-08-28 NOTE — Procedures (Addendum)
Patient Name: [name]  MRN: [id number]  Epilepsy Attending: Charlsie Quest  Referring Physician/Provider: Dr Levon Hedger  Duration: 08/27/2021 1423 to 08/28/2021 1051   Patient history: 75 year old male status post cardiac arrest.  EEG to evaluate for seizures.   Level of alertness:  comatose   AEDs during EEG study: None   Technical aspects: This EEG study was done with scalp electrodes positioned according to the 10-20 International system of electrode placement. Electrical activity was acquired at a sampling rate of 500Hz  and reviewed with a high frequency filter of 70Hz  and a low frequency filter of 1Hz . EEG data were recorded continuously and digitally stored.    Description: EEG showed  generalized periodic discharges with fluctuating frequency from 1-2 Hz  as well as continuous generalized polymorphic mixed frequencies with 5 to 9 Hz theta-alpha activity as well as 2 to 3 Hz delta slowing.  Hyperventilation and photic stimulation were not performed.      ABNORMALITY - Periodic discharges, generalized - Continuous slow, generalized   IMPRESSION: This study showed evidence of epileptogenicity with generalized onset. This EEG pattern is on the ictal-interictal continuum with high potential for seizures. There is also severe diffuse encephalopathy.  No definite seizures were seen during the study.  With history of cardiac arrest, this EEG is concerning for anoxic/hypoxic brain injury.    EEG appears to be worsening compared to previous day.    Karmin Kasprzak 

## 2021-08-28 NOTE — Progress Notes (Signed)
Discontinued cEEG study.  Notified [facility] monitoring.  No skin breakdown observed. 

## 2021-08-28 NOTE — Progress Notes (Signed)
LTM maintenance completed; no skin breakdown was seen under frontal leads. Tested event button. 

## 2021-08-28 NOTE — Progress Notes (Signed)
NAME:  [name], MRN:  [id number], DOB:  1946/12/02, LOS: 6 ADMISSION DATE:  08/23/2021, CONSULTATION DATE:  08/26/2021 REFERRING MD:  Dr. Chestine Spore, CHIEF COMPLAINT:  PEA Arrest.    History of Present Illness:  75 year old male presents 9/2 for nonhealing transmetatarsal amputation wound for revision with wound vac placement. 9/6 noted to be bradycardiac and unresponsive, given 1 EPI, and then proceed to PEA arrest, 20 minutes prior to ROSC, given EPI, Mag, Bicarb, and Amiodarone, 3 Shocks (appears to be SVT).   WBC 9.8. Hemoglobin 12.9, K 4.4, Crt 1.28   On arrival to ICU levophed is infusing in right arm which was noted to be infiltrated, patient is intubated and unresponsive. HR 100, BP 109/17. Oxygen Saturation 95%.   Pertinent  Medical History  CAD, DM, Depression, HLD, OSA, PVD  Significant Hospital Events: Including procedures, antibiotic start and stop dates in addition to other pertinent events   9/2 OR for revision  9/6 PEA arrest   Interim History / Subjective:  Remains comatose on vent. Labs look better. Wife and daughter at bedside.  Objective   Blood pressure (!) 126/51, pulse 91, temperature 99 F (37.2 C), resp. rate (!) 22, height 5\' 8"  (1.727 m), weight 93.3 kg, SpO2 100 %.    Vent Mode: PRVC FiO2 (%):  [40 %] 40 % Set Rate:  [22 bmp] 22 bmp Vt Set:  [540 mL] 540 mL PEEP:  [5 cmH20] 5 cmH20 Plateau Pressure:  [16 cmH20-18 cmH20] 18 cmH20   Intake/Output Summary (Last 24 hours) at 08/28/2021 10/28/2021 Last data filed at 08/28/2021 0800 Gross per 24 hour  Intake 2583.48 ml  Output 1370 ml  Net 1213.48 ml    Filed Weights   08/21/21 1211 09/11/2021 0655  Weight: 92.4 kg 93.3 kg    Examination:  No distress ETT with minimal secretions Scattered rhonci on auscultation of lungs Heart sounds regular, ext warm R arm looks okay, goood cap refill Pupils equal, small, reactive, +doll's, +corneals on L not on R His pupils are reactive, oculocephalic is present,  he triggers vent, has corneal reflexes, and cough/gag However, GCS3, he is on some fentanyl L foot wound vac in place  CBC/BMP all trending in right direction  Resolved Hospital Problem list     Assessment & Plan:  In hospital cardiac arrest in setting of baseline CAD, prior CABG, metabolic syndrome.  Also has been having tachy/brady on tele per report.  No preceding anginal symptoms. Question primary arrhythmia as cause.  Bladder also distended so suppose it could have been exaggerated vagal response.  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  Resp failure on vent with aspiration on CXR   Post arrest encephalopathy persistent concerning for significant anoxic brain injury   Post arrest shock- improved but not resolved   R arm ischemia from epi extravasation during code to R AC- improved with phentolamine   Postop TMA revision   Baseline DM2/HLD/HTN/Obesity/CAD, hyperglycemic with resumption of TF  - Continue vent support, VAP prevention bundle - cEEG ongoing, no seizures, fine to DC from my standpoint - CTX x 5 days for chemical aspiration - PharmD to adjust insulin regimen for BS goal 100-180 - Wound vac per primary - Avoid fevers - DC fentanyl today - Liberalize family visitation - Will have oncoming team order MRI in AM, if shows significant anoxic injury, family would like patient to be allowed to pass in peace off ventilator (very independent, would never  want LTACH or SNF); if MRI unrevealing would give another 48h and then if no change in LOC transition to comfort as well; daughter and wife in agreement - Consider LHC if neuro status improves - Family updated regarding timeline  38 min cc time Myrla Halsted MD PCCM   Best Practice (right click and "Reselect all SmartList Selections" daily)   Diet/type: TF DVT prophylaxis: prophylactic heparin  GI prophylaxis: PPI Lines: Central line Foley:  Yes, and it is still needed Code Status:   full code Last date of multidisciplinary goals of care discussion [daily]  43 minutes cc time not including any separately billable procedures Myrla Halsted MD PCCM

## 2021-08-28 NOTE — Progress Notes (Signed)
OT Cancellation Note  Patient Details Name: [name] MRN: [id number] DOB: Feb 26, 1946   Cancelled Treatment:    Reason Eval/Treat Not Completed: Medical issues which prohibited therapy (Pt remains intubated and sedated. Will discharge and await new orders.)  Evern Bio 08/28/2021, 8:23 AM Martie Round, OTR/L Acute Rehabilitation Services Pager: [phone] Office: [phone]

## 2021-08-29 ENCOUNTER — Inpatient Hospital Stay (HOSPITAL_COMMUNITY): Payer: No Typology Code available for payment source

## 2021-08-29 DIAGNOSIS — G931 Anoxic brain damage, not elsewhere classified: Secondary | ICD-10-CM

## 2021-08-29 DIAGNOSIS — I739 Peripheral vascular disease, unspecified: Secondary | ICD-10-CM

## 2021-08-29 DIAGNOSIS — Z9911 Dependence on respirator [ventilator] status: Secondary | ICD-10-CM

## 2021-08-29 DIAGNOSIS — J9601 Acute respiratory failure with hypoxia: Secondary | ICD-10-CM

## 2021-08-29 LAB — CULTURE, RESPIRATORY W GRAM STAIN: Culture: NORMAL

## 2021-08-29 LAB — CBC
HCT: 33.3 % — ABNORMAL LOW (ref 39.0–52.0)
Hemoglobin: 10.8 g/dL — ABNORMAL LOW (ref 13.0–17.0)
MCH: 31.3 pg (ref 26.0–34.0)
MCHC: 32.4 g/dL (ref 30.0–36.0)
MCV: 96.5 fL (ref 80.0–100.0)
Platelets: 239 10*3/uL (ref 150–400)
RBC: 3.45 MIL/uL — ABNORMAL LOW (ref 4.22–5.81)
RDW: 14.5 % (ref 11.5–15.5)
WBC: 10.7 10*3/uL — ABNORMAL HIGH (ref 4.0–10.5)
nRBC: 0.2 % (ref 0.0–0.2)

## 2021-08-29 LAB — BASIC METABOLIC PANEL
Anion gap: 11 (ref 5–15)
BUN: 30 mg/dL — ABNORMAL HIGH (ref 8–23)
CO2: 26 mmol/L (ref 22–32)
Calcium: 9.4 mg/dL (ref 8.9–10.3)
Chloride: 98 mmol/L (ref 98–111)
Creatinine, Ser: 1.24 mg/dL (ref 0.61–1.24)
GFR, Estimated: >60 mL/min (ref >60)
Glucose, Bld: 311 mg/dL — ABNORMAL HIGH (ref 70–99)
Potassium: 4.5 mmol/L (ref 3.5–5.1)
Sodium: 135 mmol/L (ref 135–145)

## 2021-08-29 LAB — GLUCOSE, CAPILLARY
Glucose-Capillary: 297 mg/dL — ABNORMAL HIGH (ref 70–99)
Glucose-Capillary: 309 mg/dL — ABNORMAL HIGH (ref 70–99)
Glucose-Capillary: 275 mg/dL — ABNORMAL HIGH (ref 70–99)
Glucose-Capillary: 311 mg/dL — ABNORMAL HIGH (ref 70–99)

## 2021-08-29 MED ORDER — GLYCOPYRROLATE 1 MG PO TABS
1.0000 mg | ORAL_TABLET | ORAL | Status: DC | PRN
  Filled 2021-08-29: qty 1

## 2021-08-29 MED ORDER — FENTANYL 2500MCG IN NS 250ML (10MCG/ML) PREMIX INFUSION
0.0000 ug/h | INTRAVENOUS | Status: DC
  Administered 2021-08-29: 100 ug/h via INTRAVENOUS
  Filled 2021-08-29: qty 250

## 2021-08-29 MED ORDER — POLYVINYL ALCOHOL 1.4 % OP SOLN
1.0000 [drp] | Freq: Four times a day (QID) | OPHTHALMIC | Status: DC | PRN
  Filled 2021-08-29: qty 15

## 2021-08-29 MED ORDER — INSULIN GLARGINE-YFGN 100 UNIT/ML ~~LOC~~ SOLN
40.0000 [IU] | Freq: Two times a day (BID) | SUBCUTANEOUS | Status: DC
  Administered 2021-08-29: 40 [IU] via SUBCUTANEOUS
  Filled 2021-08-29 (×3): qty 0.4

## 2021-08-29 MED ORDER — MIDAZOLAM BOLUS VIA INFUSION (WITHDRAWAL LIFE SUSTAINING TX)
2.0000 mg | INTRAVENOUS | Status: DC | PRN
  Filled 2021-08-29: qty 2

## 2021-08-29 MED ORDER — ACETAMINOPHEN 325 MG PO TABS: 650.0000 mg | ORAL_TABLET | Freq: Four times a day (QID) | ORAL | Status: DC | PRN

## 2021-08-29 MED ORDER — INSULIN ASPART 100 UNIT/ML IJ SOLN
14.0000 [IU] | INTRAMUSCULAR | Status: DC
  Administered 2021-08-29: 14 [IU] via SUBCUTANEOUS

## 2021-08-29 MED ORDER — METOPROLOL TARTRATE 25 MG/10 ML ORAL SUSPENSION
12.5000 mg | Freq: Two times a day (BID) | ORAL | Status: DC
  Administered 2021-08-29: 12.5 mg
  Filled 2021-08-29: qty 5

## 2021-08-29 MED ORDER — GLYCOPYRROLATE 0.2 MG/ML IJ SOLN
0.2000 mg | INTRAMUSCULAR | Status: DC | PRN
  Administered 2021-08-29: 0.2 mg via INTRAVENOUS

## 2021-08-29 MED ORDER — PROPOFOL 1000 MG/100ML IV EMUL
5.0000 ug/kg/min | INTRAVENOUS | Status: DC
  Filled 2021-08-29: qty 100

## 2021-08-29 MED ORDER — MIDAZOLAM HCL 2 MG/2ML IJ SOLN
1.0000 mg | INTRAMUSCULAR | Status: DC | PRN
Start: 2021-08-29 — End: 2021-08-30
  Administered 2021-08-29 (×2): 1 mg via INTRAVENOUS
  Filled 2021-08-29 (×2): qty 2

## 2021-08-29 MED ORDER — INSULIN ASPART 100 UNIT/ML IJ SOLN
12.0000 [IU] | INTRAMUSCULAR | Status: DC
  Administered 2021-08-29: 12 [IU] via SUBCUTANEOUS

## 2021-08-29 MED ORDER — FENTANYL CITRATE (PF) 100 MCG/2ML IJ SOLN
50.0000 ug | INTRAMUSCULAR | Status: DC | PRN
  Administered 2021-08-29 (×2): 50 ug via INTRAVENOUS
  Filled 2021-08-29 (×2): qty 2

## 2021-08-29 MED ORDER — FENTANYL BOLUS VIA INFUSION
100.0000 ug | INTRAVENOUS | Status: DC | PRN
  Administered 2021-08-29 (×3): 100 ug via INTRAVENOUS
  Filled 2021-08-29: qty 100

## 2021-08-29 MED ORDER — ACETAMINOPHEN 650 MG RE SUPP: 650.0000 mg | Freq: Four times a day (QID) | RECTAL | Status: DC | PRN

## 2021-08-29 MED ORDER — GLYCOPYRROLATE 0.2 MG/ML IJ SOLN
0.2000 mg | INTRAMUSCULAR | Status: DC | PRN
  Filled 2021-08-29: qty 1

## 2021-08-29 MED ORDER — DIPHENHYDRAMINE HCL 50 MG/ML IJ SOLN: 25.0000 mg | INTRAMUSCULAR | Status: DC | PRN

## 2021-08-29 MED ORDER — MIDAZOLAM-SODIUM CHLORIDE 100-0.9 MG/100ML-% IV SOLN: 0.0000 mg/h | INTRAVENOUS | Status: DC

## 2021-08-31 LAB — CULTURE, BLOOD (ROUTINE X 2)
Culture: NO GROWTH
Culture: NO GROWTH
Special Requests: ADEQUATE

## 2021-09-01 MED FILL — Medication: Qty: 1 | Status: AC

## 2021-09-16 ENCOUNTER — Ambulatory Visit: Payer: No Typology Code available for payment source

## 2021-09-20 NOTE — Discharge Summary (Signed)
Physician Discharge Summary  Patient ID: [name] MRN: [id number] DOB/AGE: 1946-05-24 75 y.o.  Admit date: 09/10/2021 Discharge date: 2021/09/03  Discharge Diagnoses:  Deceased  Secondary Diagnoses: Active Problems:   PAD (peripheral artery disease) (HCC)   Procedures: Revision of left TMA with negative pressure wound VAC placement  Discharged Condition: deceased  Hospital Course: Patient presented for the above-noted procedure.  Unfortunately on postop day 4 patient had a code event was found to be in PEA arrest.  He never regain neurologic function was thought to have prolonged hospital course and patient family elected for comfort care measures patient was palliatively extubated on September 9.  Consults:  Treatment Team:  Maeola Harman, MD  Significant Diagnostic Studies: CBC CBC Latest Ref Rng & Units Sep 03, 2021 08/28/2021 08/27/2021  WBC 4.0 - 10.5 K/uL 10.7(H) 12.1(H) -  Hemoglobin 13.0 - 17.0 g/dL 10.8(L) 11.2(L) 12.9(L)  Hematocrit 39.0 - 52.0 % 33.3(L) 34.1(L) 38.0(L)  Platelets 150 - 400 K/uL 239 271 -     COAG Lab Results  Component Value Date   INR 1.4 09/22/2007   INR 1.0 09/17/2007   INR 0.9 09/16/2007   No results found for: PTT  Disposition:    Allergies as of 09-03-21       Reactions   Ace Inhibitors    Heart attack    Lisinopril    Heart attack        Medication List     ASK your doctor about these medications    acetaminophen 500 MG tablet Commonly known as: TYLENOL Take 1 tablet (500 mg total) by mouth every 4 (four) hours as needed for moderate pain (pain).   ACTIVATED CHARCOAL PO Take 1,040 mg by mouth daily.   APPLE CIDER VINEGAR PO Take 1 capsule by mouth daily.   aspirin EC 325 MG tablet Take 325 mg by mouth in the morning.   atorvastatin 40 MG tablet Commonly known as: LIPITOR Take 40 mg by mouth in the morning.   Cholecalciferol 50 MCG (2000 UT) Tabs Take 2,000 Units by mouth in the  morning.   clopidogrel 75 MG tablet Commonly known as: PLAVIX Take 75 mg by mouth in the morning.   CoQ-10 200 MG Caps Take 200 mg by mouth in the morning.   DULoxetine 30 MG capsule Commonly known as: CYMBALTA Take 30 mg by mouth daily.   furosemide 40 MG tablet Commonly known as: LASIX Take 40 mg by mouth in the morning.   GREEN TEA EXTRACT PO Take 400 mg by mouth 2 (two) times a day.   insulin aspart protamine- aspart (70-30) 100 UNIT/ML injection Commonly known as: NOVOLOG MIX 70/30 Inject 30 Units into the skin in the morning and at bedtime.   metFORMIN 1000 MG tablet Commonly known as: GLUCOPHAGE Take 1,000 mg by mouth in the morning and at bedtime.   metoprolol tartrate 50 MG tablet Commonly known as: LOPRESSOR Take 50 mg by mouth 2 (two) times daily.   multivitamin with minerals Tabs tablet Take 1 tablet by mouth daily. Men's 50+   Neuriva Caps Take 1 capsule by mouth at bedtime.   nitroGLYCERIN 0.4 MG SL tablet Commonly known as: NITROSTAT Place 0.4 mg under the tongue every 5 (five) minutes x 3 doses as needed for chest pain.   OVER THE COUNTER MEDICATION Take 1 tablet by mouth 3 (three) times daily with meals. Sugar Blockers   OVER THE COUNTER MEDICATION Take 2 tablets by mouth at bedtime. olly  sleep   Ozempic (1 MG/DOSE) 4 MG/3ML Sopn Generic drug: Semaglutide (1 MG/DOSE) Inject 1 mg into the skin every Monday.   RESVERATROL PO Take 500 mg by mouth daily.   traZODone 50 MG tablet Commonly known as: DESYREL Take 25 mg by mouth at bedtime.         [name] C. Randie Heinz, MD [facility] Office: [phone] Pager: [phone]   09/04/2021, 10:34 AM

## 2021-09-20 NOTE — Progress Notes (Signed)
I met with [name] wife, daughter, extended family, and Dr. Donzetta Matters from vascular surgery to discuss his MRI results and ongoing care. His MRI demonstrated several areas with abnormalities, but no global hypoxia. His physical exam has failed to demonstrate significant improvement despite being off sedation, and he has had EEGs that are worrisome for severe dysfunction. Given the lack of clinical improvement so far, I fear he is likely to have a protracted course of recovery with an unknown potential outcome. His family is in agreement that he would not want a prolonged time on life support and would not want to require prolonged rehabilitation or nursing care. They are planning on withdrawing aggressive care this evening in accordance with his wishes. RN updated, medications to ensure comfort have been ordered. Liberalized visitation.  [name] Hy, DO September 02, 2021 4:49 PM [facility]

## 2021-09-20 NOTE — Progress Notes (Signed)
Pt transported to MRI and back to 2H17 without any complications.

## 2021-09-20 NOTE — Progress Notes (Signed)
Pt extubated to comfort care at this time.

## 2021-09-20 NOTE — Progress Notes (Addendum)
Pt expired, wife and daughter in attendance at bedside.  Emotional support provided.

## 2021-09-20 NOTE — Progress Notes (Signed)
eLink Physician-Brief Progress Note Patient Name: [name] DOB: 15-May-1946 MRN: [id number]   Date of Service  08/30/21  HPI/Events of Note  Heart rate gradually increasing from 100-110, to 115-120. BP is stable.  eICU Interventions  12 Lead EKG ordered.        [name] 08-30-21, 4:52 AM

## 2021-09-20 NOTE — Progress Notes (Addendum)
  Progress Note    08/28/2021 7:54 AM 7 Days Post-Op  Subjective:  patient remains intubated and critically ill; family is present at the bedside   Vitals:   08/25/2021 0500 08/26/2021 0600  BP:    Pulse: (!) 45 (!) 107  Resp: (!) 22 (!) 22  Temp: 100.2 F (37.9 C) 100 F (37.8 C)  SpO2: 99% 99%   Physical Exam: Vac in place TMA  CBC    Component Value Date/Time   WBC 10.7 (H) 08/28/2021 0354   RBC 3.45 (L) 09/14/2021 0354   HGB 10.8 (L) 08/27/2021 0354   HCT 33.3 (L) 09/04/2021 0354   PLT 239 09/14/2021 0354   MCV 96.5 09/17/2021 0354   MCH 31.3 09/09/2021 0354   MCHC 32.4 08/28/2021 0354   RDW 14.5 09/07/2021 0354   LYMPHSABS 2.9 08/26/2021 0934   MONOABS 0.7 08/26/2021 0934   EOSABS 0.1 08/26/2021 0934   BASOSABS 0.1 08/26/2021 0934    BMET    Component Value Date/Time   NA 135 09/16/2021 0354   K 4.5 09/12/2021 0354   CL 98 08/21/2021 0354   CO2 26 09/14/2021 0354   GLUCOSE 311 (H) 09/16/2021 0354   BUN 30 (H) 08/26/2021 0354   CREATININE 1.24 09/17/2021 0354   CALCIUM 9.4 09/08/2021 0354   GFRNONAA >60 08/21/2021 0354   GFRAA 88 08/30/2008 1249    INR    Component Value Date/Time   INR 1.4 09/22/2007 1200     Intake/Output Summary (Last 24 hours) at 09/19/2021 0754 Last data filed at 08/27/2021 0749 Gross per 24 hour  Intake 2194.71 ml  Output 2210 ml  Net -15.29 ml     Assessment/Plan:  75 y.o. male is s/p TMA with post op PEA arrest 7 Days Post-Op   Continue to hold off on vac changes to TMA EEG worsening yesterday; concerning for heavy anoxic brain injury Plans noted for MRI this morning and likely change to comfort care thereafter   [name], PA-C [facility] [phone] 09/10/2021 7:54 AM   I have independently interviewed and examined patient and agree with PA assessment and plan above.  Discussion had with family as outlined in Dr. Ophelia Charter note.  Plan is for withdrawal of care tonight.  Anmol Fleck C. Randie Heinz,  MD [facility] Office: [phone] Pager: [phone]

## 2021-09-20 NOTE — Progress Notes (Signed)
NAME:  [name], MRN:  [id number], DOB:  [birth year], LOS: 7 ADMISSION DATE:  September 06, 2021, CONSULTATION DATE:  08/26/2021 REFERRING MD:  Dr. Chestine Spore, CHIEF COMPLAINT:  PEA Arrest.    History of Present Illness:  75 year old male presents 9/2 for nonhealing transmetatarsal amputation wound for revision with wound vac placement. 9/6 noted to be bradycardiac and unresponsive, given 1 EPI, and then proceed to PEA arrest, 20 minutes prior to ROSC, given EPI, Mag, Bicarb, and Amiodarone, 3 Shocks (appears to be SVT).   WBC 9.8. Hemoglobin 12.9, K 4.4, Crt 1.28   On arrival to ICU levophed is infusing in right arm which was noted to be infiltrated, patient is intubated and unresponsive. HR 100, BP 109/17. Oxygen Saturation 95%.   Pertinent  Medical History  CAD, DM, Depression, HLD, OSA, PVD  Significant Hospital Events: Including procedures, antibiotic start and stop dates in addition to other pertinent events   9/2 OR for revision  9/6 PEA arrest  9/8 off NE and sedation   9/6 ceftriaxone >>  9/6 trach asp >> normal flora 9/6 Bcx2 >>  Interim History / Subjective:  No events Remains on amio gtt at 30 Remains off pressors Off fentanyl gtt/ sedation since 9/8 am ( 0800) Going for MRI at 10 am Tmax 100.2 UOP 2L/ 24 hrs, ~0.9 ml/kg/hr +868 ml, net -4.5L  Objective   Blood pressure (!) 119/54, pulse (!) 108, temperature 98.8 F (37.1 C), resp. rate (!) 22, height 5\' 8"  (1.727 m), weight 96.9 kg, SpO2 99 %.    Vent Mode: PRVC FiO2 (%):  [40 %] 40 % Set Rate:  [22 bmp] 22 bmp Vt Set:  [540 mL] 540 mL PEEP:  [5 cmH20] 5 cmH20 Plateau Pressure:  [17 cmH20-18 cmH20] 18 cmH20   Intake/Output Summary (Last 24 hours) at 08/25/2021 10/29/2021 Last data filed at 09/08/2021 10/29/2021 Gross per 24 hour  Intake 2356.4 ml  Output 2040 ml  Net 316.4 ml   Filed Weights   08/21/21 1211 2021-09-06 0655 09/12/2021 0300  Weight: 92.4 kg 93.3 kg 96.9 kg    Examination:  General:  critically ill elderly  male lying in bed in NAD HEENT: MM pink/moist, pupils 3/reactive, absent corneals,  Neuro: eyes open to stimulation but does not blink to threat, HR will increase with stimulation but otherwise no response to noxious stimuli or spont movements CV:  ST with occ ectopy, no murmur PULM:  non labored on full MV, flipped to PSV and was bradypneic not enough to sustain MV, clear, minimal secretions, minimal delayed cough with deep suctioning, clenches teeth with oral care GI: soft, obese, hypoBS, foley  Extremities: warm/dry, no LE edema, left foot wound vac s/p metatarsal amputation, right arm slightly edematous, pulses intact Skin: no rashes   Labs, remains hyperglycemic in the hight 200's/ low 300s K 4.5, sCr 1.57-> 1.24, WBC 12.1-> 10.7, hgb stable 10.8 No new imaging  Resolved Hospital Problem list     Assessment & Plan:   Cardiac arrest, inpatient - in setting of baseline CAD, prior CABG, metabolic syndrome.  Also has been having tachy/brady on tele per report.  No preceding anginal symptoms. Question primary arrhythmia as cause.  Bladder also distended so suppose it could have been exaggerated vagal response.  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   Encephalopathy, concerning for significant anoxic brain injury  - EEG neg, stopped 9/8 - pending MRI this morning, propofol if needed for trip  -  avoid fevers - Maintain neuro protective measures; goal for eurothermia, euglycemia, eunatermia, normoxia, and PCO2 goal of 35-40 - continue nutrition and bowel regiment  - serial neuro exams - limit sedation as needed - depending on MRI results, likely to go comfort today if poor exam findings, if non-acute findings, will give additional 48 hrs and if no improvement, likely to go comfort   Acute hypoxic respiratory failure Aspiration pneumonia - Continue MV support, 8cc/kg IBW with goal Pplat <30 and DP<15  - VAP prevention protocol/ PPI - PAD protocol  for sedation> not currently needed - wean FiO2 as able for SpO2 >92%  - daily SAT & SBT, failed SBT 9/9 am, low MV/ bradypnea  - continue ceftriaxone x 5 days;  sputum cx neg 9/6   Post arrest shock- resolved - TTE 9/6 EF <20%, LV/ entire anteroseptal wall with WMA, G2DD, normal RV - off pressors 9/8  - remains normotensive - if neuro recovery, consider LHC    R arm ischemia from epi extravasation during code to R AC- improved with phentolamine Postop TMA revision - continue wound vac   HTN HLD CAD ? primary arrhythmia Post arrest cardiomyopathy/ myocardial stunning - supportive care - continue ASA/ plavix - has remained in SR/ ST, likely can d/c amio and continue to monitor  DMT2 - remains poorly controlled, total insulin use in last 24 hrs ~150 units - continue SSI resistant - increase basal from 20->40 units BID - increase TF coverage to 12 units q4, hold if TF stopped   Best Practice (right click and "Reselect all SmartList Selections" daily)   Diet/type: TF DVT prophylaxis: prophylactic heparin SQ GI prophylaxis: PPI Lines: Central line Foley:  Yes, and it is still needed Code Status:  DNR Last date of multidisciplinary goals of care discussion [pending family update 9/9)    CCT time: 35 mins   Posey Boyer, ACNP [facility] Sep 22, 2021, 9:24 AM  See Amion for pager If no response to pager, please call PCCM consult pager After 7:00 pm call Elink

## 2021-09-20 NOTE — Progress Notes (Signed)
Patient extubated to comfort care surrounded by wife, daughter and pastor.  Patient given fentanyl for dyspnea, see MAR for further progression.

## 2021-09-20 DEATH — deceased

## 2022-09-26 IMAGING — CR DG FOOT COMPLETE 3+V*L*
3 series · 3 of 3 positions shown · non-contrast
Comparison: 07/09/2021

CLINICAL DATA: Left foot infection.

EXAM:
LEFT FOOT - COMPLETE 3+ VIEW

[foot ap]
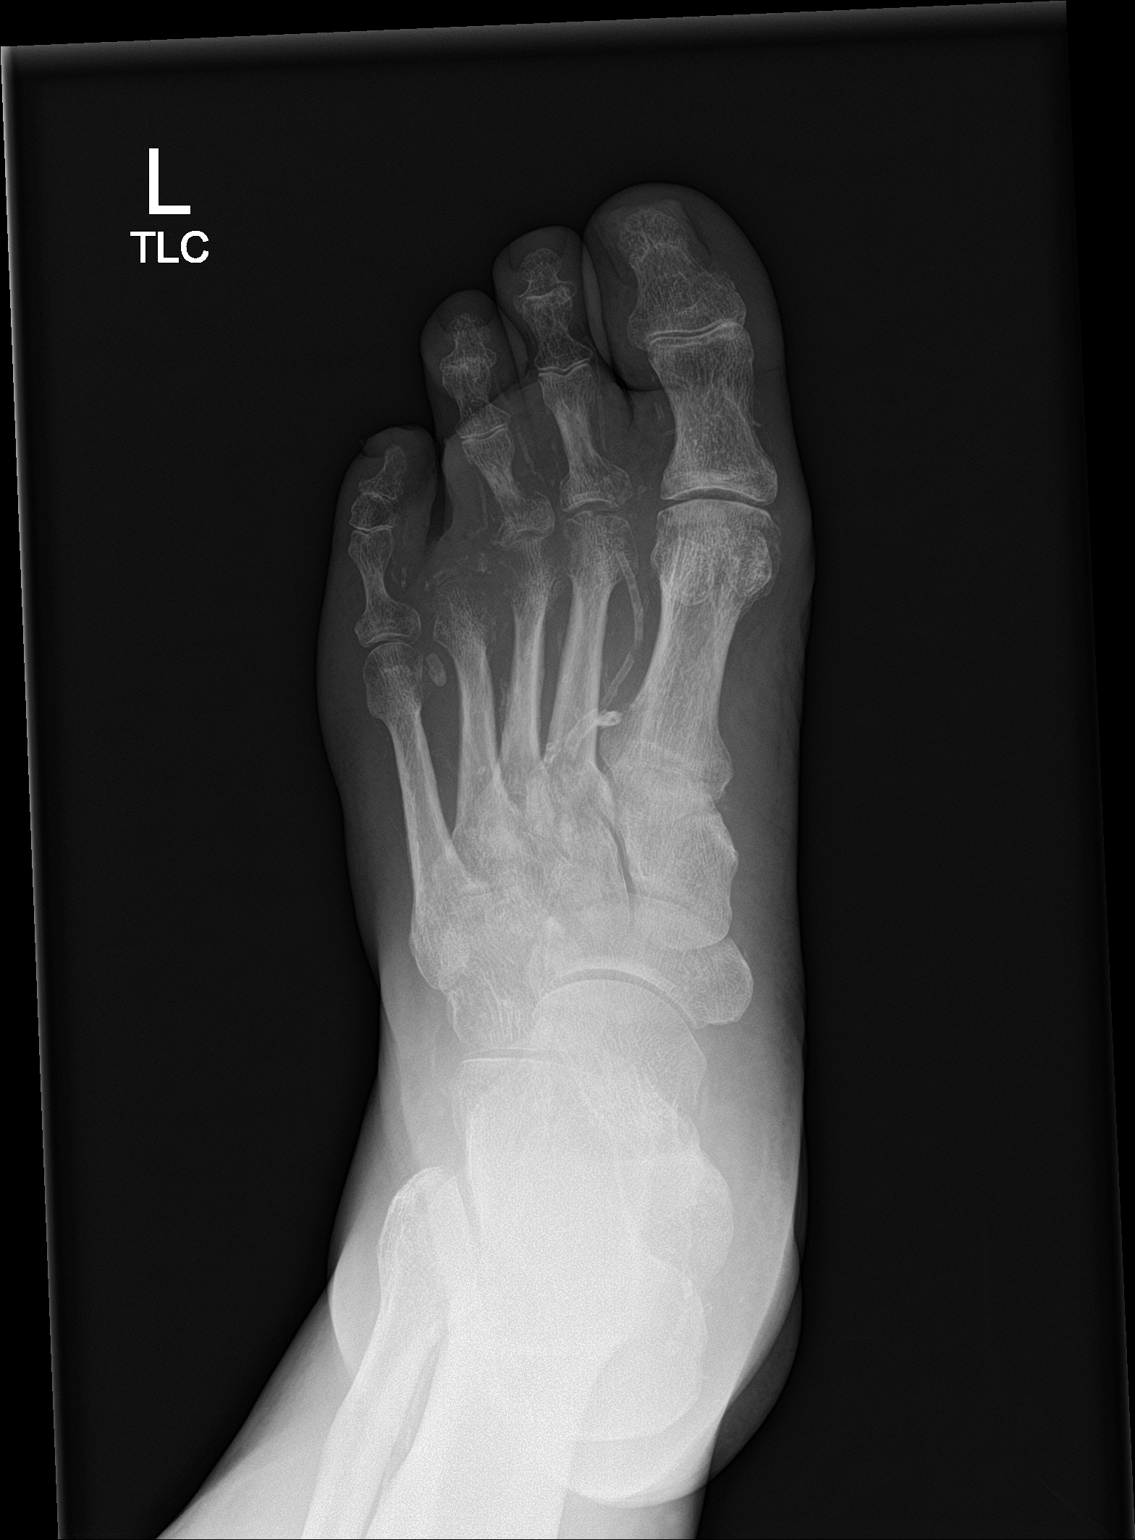

[foot obl]
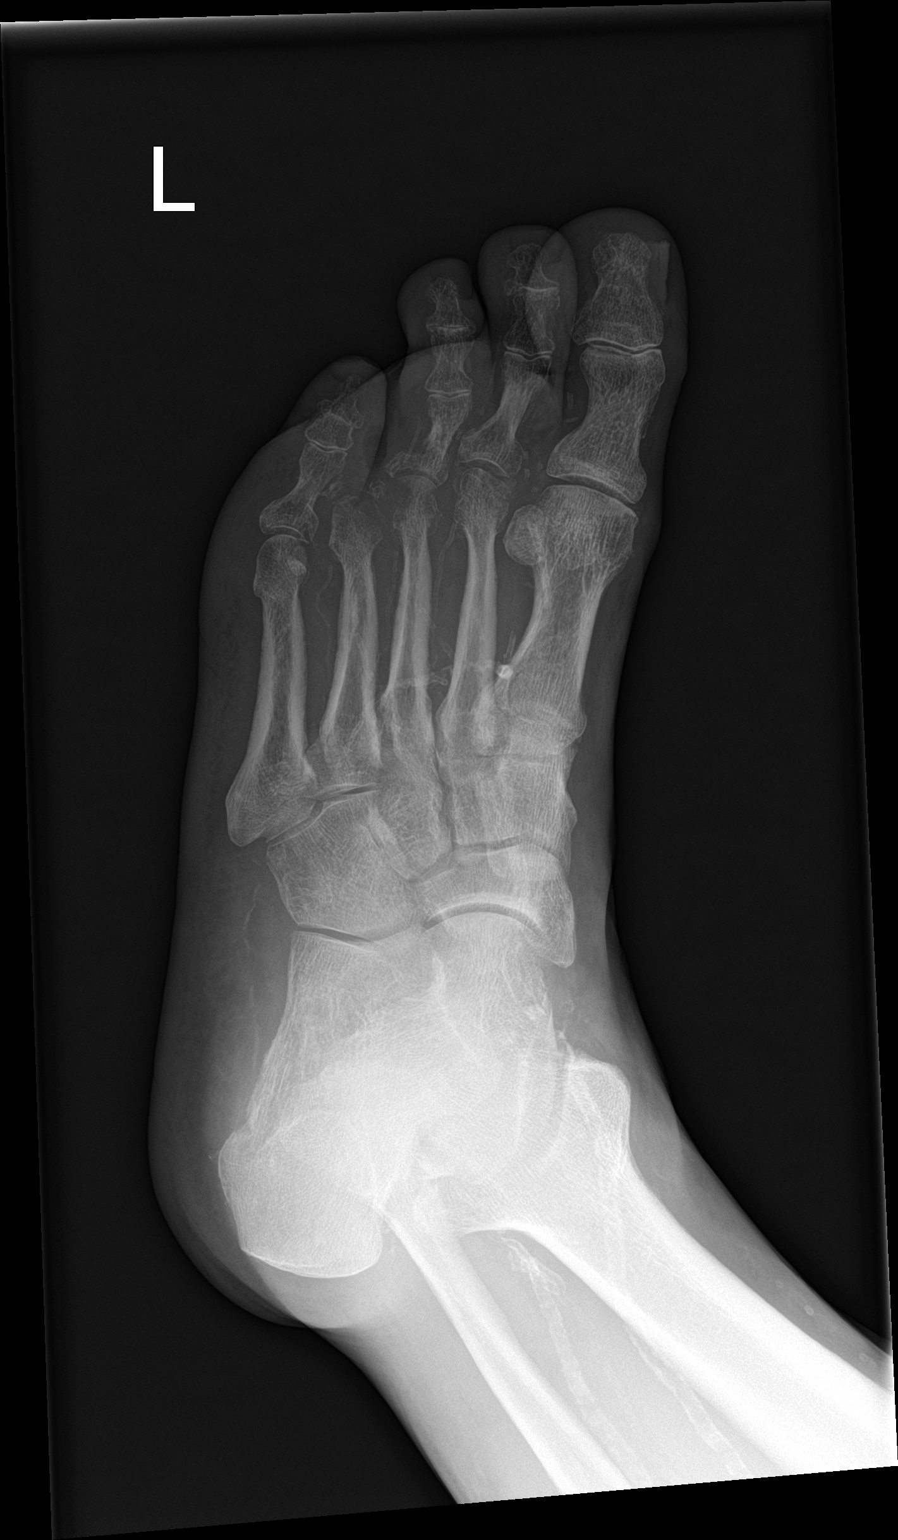

[foot lat]
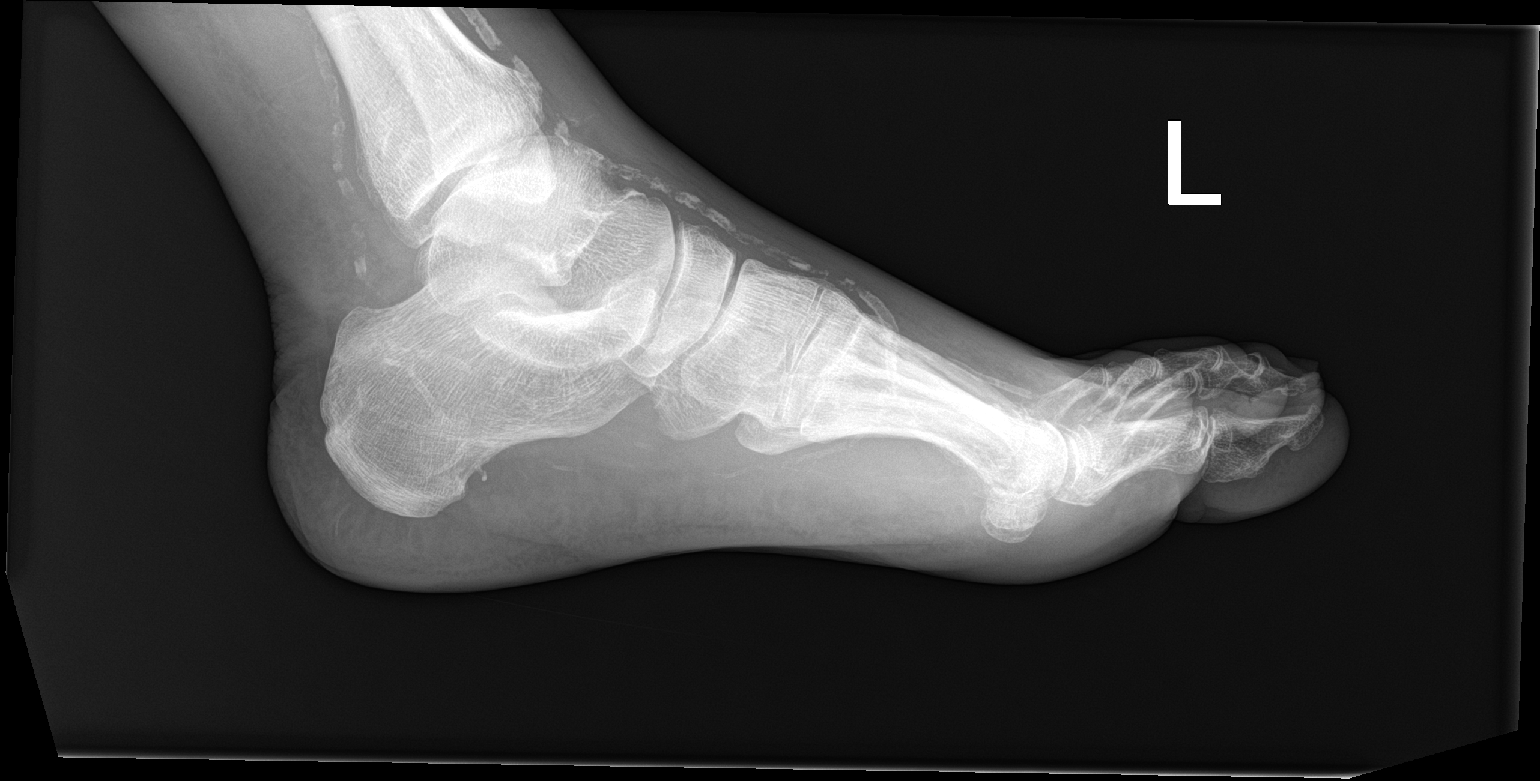

[3 of 3 positions shown; findings below may reference images not displayed]

FINDINGS: Stable from 2 days ago. Erosions at the third and fourth metatarsal
heads and at the base of the third proximal phalanx. The fourth toe
has been amputated. Generalized osteopenia and arterial
calcification. No opaque foreign body or soft tissue emphysema.
IMPRESSION: 1. Stable from 2 days ago.
2. Erosion/osteomyelitis of the third and fourth metatarsal heads
and the third proximal phalanx.
